# Patient Record
Sex: Female | Born: 1945
Health system: Southern US, Community
[De-identification: ages and names within clinical notes are randomized; demographics above are authoritative.]

## PROBLEM LIST (undated history)

## (undated) DIAGNOSIS — T63331A Toxic effect of venom of brown recluse spider, accidental (unintentional), initial encounter: Secondary | ICD-10-CM

## (undated) DIAGNOSIS — K5900 Constipation, unspecified: Secondary | ICD-10-CM

## (undated) DIAGNOSIS — A6920 Lyme disease, unspecified: Secondary | ICD-10-CM

## (undated) DIAGNOSIS — F410 Panic disorder [episodic paroxysmal anxiety] without agoraphobia: Secondary | ICD-10-CM

## (undated) DIAGNOSIS — F4001 Agoraphobia with panic disorder: Secondary | ICD-10-CM

## (undated) DIAGNOSIS — B029 Zoster without complications: Secondary | ICD-10-CM

## (undated) DIAGNOSIS — K219 Gastro-esophageal reflux disease without esophagitis: Secondary | ICD-10-CM

## (undated) DIAGNOSIS — K5792 Diverticulitis of intestine, part unspecified, without perforation or abscess without bleeding: Secondary | ICD-10-CM

## (undated) DIAGNOSIS — H9313 Tinnitus, bilateral: Secondary | ICD-10-CM

## (undated) DIAGNOSIS — G43909 Migraine, unspecified, not intractable, without status migrainosus: Secondary | ICD-10-CM

## (undated) DIAGNOSIS — E785 Hyperlipidemia, unspecified: Secondary | ICD-10-CM

## (undated) HISTORY — DX: Migraine, unspecified, not intractable, without status migrainosus: G43.909

## (undated) HISTORY — DX: Constipation, unspecified: K59.00

## (undated) HISTORY — DX: Lyme disease, unspecified: A69.20

## (undated) HISTORY — PX: DILATION AND CURETTAGE OF UTERUS: SHX78

## (undated) HISTORY — DX: Toxic effect of venom of brown recluse spider, accidental (unintentional), initial encounter: T63.331A

---
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
## 1980-11-02 DIAGNOSIS — F4001 Agoraphobia with panic disorder: Secondary | ICD-10-CM

## 1980-11-02 HISTORY — DX: Agoraphobia with panic disorder: F40.01

## 2003-02-13 ENCOUNTER — Ambulatory Visit (HOSPITAL_COMMUNITY): Admission: RE | Admit: 2003-02-13 | Discharge: 2003-02-13 | Payer: Self-pay | Admitting: Internal Medicine

## 2006-10-04 ENCOUNTER — Ambulatory Visit: Payer: Self-pay | Admitting: Internal Medicine

## 2006-10-29 ENCOUNTER — Ambulatory Visit: Payer: Self-pay | Admitting: Internal Medicine

## 2006-10-29 ENCOUNTER — Ambulatory Visit (HOSPITAL_COMMUNITY): Admission: RE | Admit: 2006-10-29 | Discharge: 2006-10-29 | Payer: Self-pay | Admitting: Internal Medicine

## 2006-10-29 ENCOUNTER — Encounter (INDEPENDENT_AMBULATORY_CARE_PROVIDER_SITE_OTHER): Payer: Self-pay | Admitting: *Deleted

## 2008-11-06 ENCOUNTER — Ambulatory Visit: Payer: Self-pay | Admitting: Internal Medicine

## 2008-11-09 ENCOUNTER — Ambulatory Visit (HOSPITAL_COMMUNITY): Admission: RE | Admit: 2008-11-09 | Discharge: 2008-11-09 | Payer: Self-pay | Admitting: Internal Medicine

## 2008-11-09 ENCOUNTER — Encounter: Payer: Self-pay | Admitting: Internal Medicine

## 2008-11-09 ENCOUNTER — Ambulatory Visit: Payer: Self-pay | Admitting: Internal Medicine

## 2008-12-11 ENCOUNTER — Telehealth (INDEPENDENT_AMBULATORY_CARE_PROVIDER_SITE_OTHER): Payer: Self-pay

## 2009-02-20 ENCOUNTER — Ambulatory Visit: Payer: Self-pay | Admitting: Internal Medicine

## 2009-02-20 DIAGNOSIS — F410 Panic disorder [episodic paroxysmal anxiety] without agoraphobia: Secondary | ICD-10-CM | POA: Insufficient documentation

## 2009-02-20 DIAGNOSIS — K5730 Diverticulosis of large intestine without perforation or abscess without bleeding: Secondary | ICD-10-CM | POA: Insufficient documentation

## 2009-02-20 DIAGNOSIS — Z8719 Personal history of other diseases of the digestive system: Secondary | ICD-10-CM

## 2009-02-20 DIAGNOSIS — H269 Unspecified cataract: Secondary | ICD-10-CM

## 2009-02-21 DIAGNOSIS — R1319 Other dysphagia: Secondary | ICD-10-CM | POA: Insufficient documentation

## 2009-02-21 DIAGNOSIS — D126 Benign neoplasm of colon, unspecified: Secondary | ICD-10-CM

## 2009-04-26 ENCOUNTER — Telehealth (INDEPENDENT_AMBULATORY_CARE_PROVIDER_SITE_OTHER): Payer: Self-pay

## 2009-06-07 DIAGNOSIS — R109 Unspecified abdominal pain: Secondary | ICD-10-CM

## 2009-06-07 DIAGNOSIS — R112 Nausea with vomiting, unspecified: Secondary | ICD-10-CM

## 2009-06-07 DIAGNOSIS — R1013 Epigastric pain: Secondary | ICD-10-CM

## 2009-06-07 DIAGNOSIS — R634 Abnormal weight loss: Secondary | ICD-10-CM

## 2009-09-04 ENCOUNTER — Ambulatory Visit: Payer: Self-pay | Admitting: Internal Medicine

## 2009-09-06 ENCOUNTER — Ambulatory Visit: Payer: Self-pay | Admitting: Internal Medicine

## 2009-09-06 DIAGNOSIS — R1013 Epigastric pain: Secondary | ICD-10-CM

## 2009-09-06 DIAGNOSIS — Z860100 Personal history of colon polyps, unspecified: Secondary | ICD-10-CM | POA: Insufficient documentation

## 2009-09-06 DIAGNOSIS — K589 Irritable bowel syndrome without diarrhea: Secondary | ICD-10-CM

## 2009-09-06 DIAGNOSIS — K3189 Other diseases of stomach and duodenum: Secondary | ICD-10-CM | POA: Insufficient documentation

## 2009-09-18 ENCOUNTER — Encounter: Payer: Self-pay | Admitting: Internal Medicine

## 2010-11-02 HISTORY — PX: COLONOSCOPY: SHX174

## 2010-12-18 ENCOUNTER — Emergency Department (HOSPITAL_COMMUNITY)
Admission: EM | Admit: 2010-12-18 | Discharge: 2010-12-19 | Disposition: A | Discharge status: Home / Self Care | Payer: BC Managed Care – PPO | Attending: Emergency Medicine | Admitting: Emergency Medicine

## 2010-12-18 DIAGNOSIS — R51 Headache: Secondary | ICD-10-CM | POA: Insufficient documentation

## 2010-12-18 DIAGNOSIS — K6289 Other specified diseases of anus and rectum: Secondary | ICD-10-CM | POA: Insufficient documentation

## 2010-12-18 DIAGNOSIS — K625 Hemorrhage of anus and rectum: Secondary | ICD-10-CM | POA: Insufficient documentation

## 2010-12-18 DIAGNOSIS — K648 Other hemorrhoids: Secondary | ICD-10-CM | POA: Insufficient documentation

## 2010-12-18 DIAGNOSIS — K644 Residual hemorrhoidal skin tags: Secondary | ICD-10-CM | POA: Insufficient documentation

## 2010-12-18 DIAGNOSIS — K5900 Constipation, unspecified: Secondary | ICD-10-CM | POA: Insufficient documentation

## 2011-01-02 ENCOUNTER — Encounter: Payer: Self-pay | Admitting: Internal Medicine

## 2011-01-05 ENCOUNTER — Ambulatory Visit (INDEPENDENT_AMBULATORY_CARE_PROVIDER_SITE_OTHER): Payer: BC Managed Care – PPO | Admitting: Internal Medicine

## 2011-01-05 ENCOUNTER — Encounter: Payer: Self-pay | Admitting: Internal Medicine

## 2011-01-13 NOTE — Letter (Signed)
Summary: CT/CT ABD-PELV W/CM  CT/CT ABD-PELV W/CM   Imported By: Rexene Alberts 01/05/2011 16:06:26  _____________________________________________________________________  External Attachment:    Type:   Image     Comment:   External Document

## 2011-01-13 NOTE — Letter (Signed)
Summary: [facility]CS ORDER  TCS ORDER   Imported By: Ave Filter 01/05/2011 16:28:54  _____________________________________________________________________  External Attachment:    Type:   Image     Comment:   External Document

## 2011-01-19 ENCOUNTER — Ambulatory Visit (HOSPITAL_COMMUNITY)
Admission: RE | Admit: 2011-01-19 | Discharge: 2011-01-19 | Disposition: A | Discharge status: Home / Self Care | Payer: BC Managed Care – PPO | Source: Ambulatory Visit | Attending: Internal Medicine | Admitting: Internal Medicine

## 2011-01-19 ENCOUNTER — Encounter: Payer: BC Managed Care – PPO | Admitting: Internal Medicine

## 2011-01-19 ENCOUNTER — Other Ambulatory Visit: Payer: Self-pay | Admitting: Internal Medicine

## 2011-01-19 DIAGNOSIS — D126 Benign neoplasm of colon, unspecified: Secondary | ICD-10-CM

## 2011-01-19 DIAGNOSIS — K648 Other hemorrhoids: Secondary | ICD-10-CM

## 2011-01-19 DIAGNOSIS — K921 Melena: Secondary | ICD-10-CM

## 2011-01-19 DIAGNOSIS — K5730 Diverticulosis of large intestine without perforation or abscess without bleeding: Secondary | ICD-10-CM

## 2011-01-19 DIAGNOSIS — Z860100 Personal history of colon polyps, unspecified: Secondary | ICD-10-CM | POA: Insufficient documentation

## 2011-01-19 DIAGNOSIS — K644 Residual hemorrhoidal skin tags: Secondary | ICD-10-CM | POA: Insufficient documentation

## 2011-01-19 DIAGNOSIS — Z800 Family history of malignant neoplasm of digestive organs: Secondary | ICD-10-CM | POA: Insufficient documentation

## 2011-01-21 ENCOUNTER — Encounter: Payer: Self-pay | Admitting: Internal Medicine

## 2011-01-27 NOTE — Op Note (Signed)
  NAMEJILLAYNE, WITTE              ACCOUNT NO.:  [id number]  MEDICAL RECORD NO.:  [id number]           PATIENT TYPE:  O  LOCATION:  DAYP                          FACILITY:  [facility]  PHYSICIAN:  R. Roetta Sessions, M.D. DATE OF BIRTH:  [birth year]  DATE OF PROCEDURE:  01/19/2011 DATE OF DISCHARGE:                              OPERATIVE REPORT   Ileocolonoscopy with snare polypectomy.  INDICATIONS FOR PROCEDURE:  A 65 year old lady with recent bout of diverticulitis, intermittent hematochezia in the setting of constipation, and positive family history of colon cancer.  She has a personal history of polyps and a family history of polyps in her son. Colonoscopy is now being done.  Risks, benefits, limitations, alternatives, imponderables have been discussed, questions answered. Please see the documentation medical record.  PROCEDURE NOTE:  O2 saturation, blood pressure, pulse, and respirations were monitored throughout the entire procedure.  CONSCIOUS SEDATION:  Versed 9 mg IV, Demerol 150 mg IV in divided doses.  INSTRUMENT:  Pentax video chip system.  FINDINGS:  Digital rectal exam revealed a couple external hemorrhoidal tags only.  Endoscopic Findings:  The prep was adequate.  Colon: Colonic mucosa was surveyed from the rectosigmoid junction through the left transverse right colon to the appendiceal orifice, ileocecal valve/cecum.  These structures were well seen and photographed for the record.  Terminal ileum was intubated 5 cm.  From this level, scope was slowly and cautiously withdrawn.  All previously mentioned mucosal surfaces were again seen.  The patient had numerous left-sided diverticula and had an 8-mm sessile polyp in junction of the descending and sigmoid.  This polyp was hot snare removed.  The remainder of colonic os and terminal mucosa appeared normal.  Scope was pulled down the rectum.  A thorough examination of the rectal mucosa including retroflexed view of the  anal verge, on fos view of the anal canal demonstrated no other abnormalities.  The patient tolerated the procedure well.  Cecal withdrawal time 13 minutes.  IMPRESSION: 1. External hemorrhoids, otherwise normal rectum. 2. Left-sided diverticula status post snare polypectomy left colon     polyp.  Remainder of the colonic mucosa and terminal ileal mucosa     appeared normal.  RECOMMENDATIONS: 1. Polyp and diverticulosis literature provided to Ms. Cortner. 2. Daily fiber supplement, Benefiber 1 tablespoon. 3. Align probiotic 1 capsule daily. 4. Anusol-HC suppositories one per rectum at bedtime x10 days. 5. Followup on path. 6. Further recommendations to follow.     Jonathon Bellows, M.D.     RMR/MEDQ  D:  01/19/2011  T:  01/19/2011  Job:  [id number]  Electronically Signed by Lorrin Goodell M.D. on 01/27/2011 11:23:08 AM

## 2011-01-29 NOTE — Assessment & Plan Note (Signed)
Summary: stomach ache/PT WENT TO [facility] AN HAD CT AND LABS ON 01/01/11/LAW   Vital Signs:  Patient profile:   65 year old female Height:      66 inches Weight:      174 pounds BMI:     28.19 Temp:     98.3 degrees F oral Pulse rate:   72 / minute BP sitting:   146 / 82  (right arm)  Vitals Entered By: Carolan Clines LPN (January 04, 1609 3:10 PM)  History of Present Illness: Repeat blood pressure after the encounter 130/72 right arm    Pleasant 65 year old lady with  recent abdominal pain very much similar to prior bout of diverticulitis. She was given a 10 day course of Augmentin by her PCP with resolution of her symptoms a short time later. She saw me in the hallway at the hospital last week and conveyed her symptoms to me. She underwent a CT of the abdomen and pelvis in [place] last week which revealed extensive diverticulosis but no evidence of diverticulitis or other pathology.  She has been constipated recently. She has had some hematochezia from time to time. She has a history of colonic adenomas and is due for a surveillance colonoscopy this year. In fact, she was at the hospital last week with her son who had some polyps removed by Dr. Karilyn Cota.  She also notes a bulge coming from her vagina when she strains. When she was seen in the ED previously, she was told she may have a rectocele. She is to see Dr. Mora Appl in July of this year.  Patient denies any upper GI tract symptoms such as reflux, odynaphagia, dysphagia early satiety nausea or  vomiting. She denies melena. She denies any recent weight loss.   Current Problems (verified): 1)  Colonic Polyps, Hx of  (ICD-V12.72) 2)  Ibs  (ICD-564.1) 3)  Dyspepsia  (ICD-536.8) 4)  Abdominal Pain  (ICD-789.00) 5)  Weight Loss  (ICD-783.21) 6)  Nausea and Vomiting  (ICD-787.01) 7)  Epigastric Pain  (ICD-789.06) 8)  Dysphagia  (ICD-787.29) 9)  Colonic Polyps  (ICD-211.3) 10)  Cataracts  (ICD-366.9) 11)  Helicobacter Pylori Gastritis, Hx of   (ICD-V12.79) 12)  Diverticulosis of Colon  (ICD-562.10) 13)  Panic Disorder  (ICD-300.01)  Allergies: 1)  ! Flagyl 2)  ! Codeine 3)  ! Cipro 4)  ! * Antihistamines  Past History:  Past Medical History: Last updated: 09/04/2009 hx of brown recluse spider bite hx of lyme disease  Past Surgical History: Last updated: 06/07/2009 tonsillectomy appendectomy cholecystectomy  Family History: Last updated: 02-26-09 Father: deceased 61-melanoma Mother: deceased 86-dementia, sepsis Siblings: 1 brother, 2 sisters  Social History: Last updated: 26-Feb-2009 Marital Status: widow Children: 5 boys Occupation:none   Physical Exam  General:  pleasant alert lady resting comfortably Eyes:  no scleral icterus. Conjunctiva are pink Lungs:  clear to auscultation Heart:  regular rate and rhythm without murmur gallop or rub Abdomen:  nondistended positive bowel sounds.Soft nontender without appreciable mass or organomegaly Rectal:  deferred until time of colonoscopy   Impression & Recommendations: Impression: Pleasant 65 year old lady with recent abdominal pain similar to the symptoms she had with a bout of diverticulitis some 2 years ago. Now better with a course of Augmentin. CT on March 1 is reassuring. She has had some hematochezia with straining.  The buldge she notes in her vagina could well be a rectocele. Not mentioned above, she was taking a fiber supplement faithfully, but stopped  his practice  sometime ago because she was feeling so well. She has a personal history of colonic adenomas and family including a first-degree relative with colonic polyps.  Recommendations: Proceed with surveillance/diagnostic colonoscopy in the near future. Risks, benefits, limitations, alternatives and imponderables have been reviewed. Her questions have been answered. She is agreeable. Will make further recommendations once  colonoscopy has been accomplished

## 2011-02-03 ENCOUNTER — Telehealth: Payer: Self-pay

## 2011-02-03 NOTE — Telephone Encounter (Signed)
Pt called requesting results from TCS/path. Please advise

## 2011-02-03 NOTE — Telephone Encounter (Signed)
Please let pt know polyp was not pre-cancerous or worrisome. RMR will review & make further recommendations when he returns. Thanks

## 2011-02-04 NOTE — Telephone Encounter (Signed)
Pt aware, stated she has had some nausea every morning but feeling better by late afternoon. Offered to ask NP or PA and pt stated she wanted to wait until RMR got back and ask him. Advised pt to try clear liquids in the mornings and if she got worse to call me back.

## 2011-02-08 ENCOUNTER — Encounter: Payer: Self-pay | Admitting: Internal Medicine

## 2011-02-08 NOTE — Telephone Encounter (Signed)
See 02/08/11 letter

## 2011-02-23 ENCOUNTER — Encounter: Payer: Self-pay | Admitting: *Deleted

## 2011-02-23 ENCOUNTER — Encounter: Payer: Self-pay | Admitting: Internal Medicine

## 2011-03-17 NOTE — H&P (Signed)
[name], [name]              ACCOUNT NO.:  [id number]   MEDICAL RECORD NO.:  [id number]          PATIENT TYPE:  AMB   LOCATION:  DAY                           FACILITY:  [facility]   PHYSICIAN:  R. Roetta Sessions, M.D. DATE OF BIRTH:  10-30-1946   DATE OF ADMISSION:  DATE OF DISCHARGE:  LH                              HISTORY & PHYSICAL   REFERRING PHYSICIAN:  Doreen Beam, MD, [facility].   REASON FOR CONSULTATION:  Upper abdominal pain and nausea.   HISTORY OF PRESENT ILLNESS:  [name]. Alexa Wilson is a very pleasant 65-  year-old Caucasian female sent over at the courtesy of Dr. Doreen Beam to  previous to further evaluate a 4-year history of intermittent early  morning nausea.  She states over the past 2 years her symptoms have  worsened.  She started having early morning nausea and queasiness after  the death of her husband some 4 years ago and she notes that over the  past 2 years, particularly over the past 1 year when she started keeping  her grandchildren for her daughter, had ago back to work but the nausea  has picked up significantly.  Almost every morning, she has queasiness  for a couple of hours, it may start at 5 or 6 o'clock in the morning.  She has only vomited a couple of times in the past several years.  She  also has hunger pangs intermittently with early morning episodes of  nausea.  She has no trouble whatsoever eating a full meal.  Denies early  satiety and gastroesophageal reflux disease symptoms including  heartburn/pyrosis.  No odynophagia.  No dysphagia.  Has not had any  melena or rectal bleeding.  Denies constipation, diarrhea.  Has one  bowel movement daily.  She has, along with hunger pangs and nausea,  bleeding epigastric pain from time to time.  She denies any nonsteroidal  use.  There is no prior history of peptic ulcer disease.  She is on acid  suppression therapy.  She does have a significant history for panic  attacks and is therefore  on alprazolam.  Currently, her gallbladder was  removed in 1967.  She tells me she had a CT scan over at [facility] sometime last year, did not show anything other than  diverticulosis.  Moreover, she had labs done through Dr. Sherril Croon' office  back in the fall which demonstrated normal LFTs.  She was not anemic.  Factor H and H was slightly elevated at 15.1 and 45.5 at that time.  TSH  was normal at 262.64.  A 25-hydroxy vitamin D level was somewhat low at  21.5.  She does take vitamin D calcium supplementation.  I have seen  this nice lady previously for colonic polyps.  The last colonoscopy was  in December 2007 which she was found to have multiple colonic adenoma.  She is due for surveillance colonoscopy the end of this year (December  2010).   PAST MEDICAL HISTORY:  Recurrent panic attacks.   PAST SURGERIES:  Multiple colonoscopies, history of colonic polyps,  tonsillectomy, appendectomy, cholecystectomy.  CURRENT MEDICATIONS:  1. Alprazolam 1 mg t.i.d.  2. Multivitamin.  3. Vitamin D daily.   ALLERGIES:  CODEINE, CIPRO, ANTIHISTAMINES.   FAMILY HISTORY:  Mother died at age 63 with a complication related to  sepsis.  Father died with melanoma.  No history of GI or liver illness.   SOCIAL HISTORY:  The patient is widowed.  She is retired from [employer].  No tobacco.  No alcohol.  No illicit drugs.   REVIEW OF SYSTEMS:  No recent chest pain, dyspnea on exertion.  She is  down 13 pounds from what she weighed here in 2007.  No night sweats.   PHYSICAL EXAMINATION:  GENERAL:  Pleasant 65 year old lady resting  comfortably.  VITAL SIGNS:  Weight 173, height 5 feet 6 inches, temperature 97.9, BP  140/78, pulse 84.  SKIN:  Warm and dry.  There is no jaundice or cutaneous stigmata of  chronic liver disease.  HEENT:  No scleral icterus.  Conjunctivae pink.  CHEST:  Lungs are clear to auscultation.  CARDIAC:  Regular rate and rhythm without murmur, gallop, or rub.   ABDOMEN:  Nondistended.  Positive bowel sounds, soft, entirely nontender  without appreciable mass or organomegaly.  EXTREMITIES:  No edema.   IMPRESSION:  [name] is a pleasant 65 year old lady with  chronic symptoms of early morning nausea, some symptoms of vague  epigastric pain associated with nausea.  She rarely ever vomits.  She  also has early-morning hunger pangs.  Her symptoms have been chronic and  longstanding.  She cites temporally worsening of symptoms with looking  after her grandchildren over the past 1 year.  I certainly do not detect  any alarm features.  She has had an insidious mild degree of weight loss  over the past 3 years.  She reportedly had essentially negative CT scan  recently.  Her labs are reassuring and are as noted above.   I told Alexa Wilson we ought to go ahead in directly visualize her upper  GI tract via an EGD in the near future.  Risks, benefits, alternatives,  limitations have been reviewed, questions answered.  She is agreeable.  We will plan to set this up at [facility] at her convenience.  As a  separate issue, she has a personal history of colonic adenomas and would  be due for surveillance colonoscopy in December 2010.  I see no reason  to change that schedule currently.  Further recommendations to follow.   I would like to thank Dr. Doreen Beam for allowing me to see this nice  lady once again.      Jonathon Bellows, M.D.  Electronically Signed     RMR/MEDQ  D:  11/06/2008  T:  11/07/2008  Job:  [id number]

## 2011-03-17 NOTE — Op Note (Signed)
Alexa Wilson, Alexa Wilson              ACCOUNT NO.:  [id number]   MEDICAL RECORD NO.:  [id number]          PATIENT TYPE:  AMB   LOCATION:  DAY                           FACILITY:  [facility]   PHYSICIAN:  R. Roetta Sessions, M.D. DATE OF BIRTH:  09/10/1946   DATE OF PROCEDURE:  11/09/2008  DATE OF DISCHARGE:                               OPERATIVE REPORT   INDICATIONS FOR PROCEDURE:  A 65 year old lady with a vague epigastric  pain, nausea, and rare vomiting with some early morning hunger pangs.  __________ weight loss over the past 1 year.  She had reportedly a  negative CT of the abdomen done at [facility] recently.  EGD is  now being done.  Risks, benefits, alternatives and limitations have been  reviewed.  Please see the documentation in the medical record.   PROCEDURE NOTE:  O2 saturation, blood pressure, pulse, respirations were  monitored throughout the entire procedure.   CONSCIOUS SEDATION:  Versed 5 mg IV, Demerol 125 mg IV in divided doses.  Cetacaine spray for topical pharyngeal anesthesia.   INSTRUMENT:  Pentax video chip system.   FINDINGS:  Examination of the tubular esophagus revealed no mucosal  abnormalities.  EG junction easily traversed.  Stomach:  Gastric cavity  was emptied and insufflated well with air.  Thorough examination of the  gastric mucosa including retroflexed view of the proximal stomach  esophagogastric junction demonstrated a hiatal hernia and diffuse  mottling  and fine submucosal petechiae of the epigastric mucosa.  There  was no infiltrating process or ulcer.  Pylorus was patent and easily  traversed.  Examination of the bulb and second portion revealed no  abnormalities.  Therapeutic/diagnostic maneuvers performed.  Antral and  body biopsies were taken to further evaluate the abnormal findings today  and to rule out H. pylori.  The patient tolerated the procedure well and  was reacted in endoscopy.   IMPRESSION:  Normal esophagus, hiatal hernia,  diffuse subtle mottling  and submucosal petechiae of the epigastric mucosa.  Status post biopsy.  Patent pylorus, normal D1 and D2.   RECOMMENDATIONS:  1. Follow up on path.  2. Further recommendations to follow.      Jonathon Bellows, M.D.  Electronically Signed     RMR/MEDQ  D:  11/09/2008  T:  11/09/2008  Job:  [id number]   cc:   Doreen Beam, MD  Fax: [phone]

## 2011-03-20 NOTE — Op Note (Signed)
NAME:  Alexa Wilson, Alexa Wilson                        ACCOUNT NO.:  [id number]   MEDICAL RECORD NO.:  [id number]                   PATIENT TYPE:  AMB   LOCATION:  DAY                                  FACILITY:  [facility]   PHYSICIAN:  R. Roetta Sessions, M.D.              DATE OF BIRTH:  1946/09/22   DATE OF PROCEDURE:  02/13/2003  DATE OF DISCHARGE:                                 OPERATIVE REPORT   PROCEDURE:  Surveillance colonoscopy.   INDICATIONS FOR PROCEDURE:  The patient is a 65 year old lady with a history  of colonic polyps on prior colonoscopy at [facility].  She has done  well, except for intermittent postprandial abdominal cramps and loose  stools.  She has a history of diverticulosis.  Colonoscopy is now being done  to further evaluate her symptoms and in part for surveillance purposes.  This approach has been discussed with the patient at the bedside.  The  potential risks, benefits, and alternatives have been reviewed and questions  answered.  She is agreeable.  Please see my documented H&P on the chart for  more information.   PROCEDURE:  O2 saturation, blood pressure, pulses, and respirations were  monitored throughout the entire procedure.  Conscious sedation was with  Versed 5 mg IV, Demerol 125 mg IV in divided doses.  The instrument used was  the Olympus video chip adult colonoscope.   FINDINGS:  Digital rectal examination revealed no abnormalities.   ENDOSCOPIC FINDINGS:  The prep was good.   Rectum:  Examination of the rectal mucosa including retroflex view of the  anal verge revealed no abnormalities.   Colon:  The colonic mucosa was surveyed from the rectosigmoid junction  through the left, transverse, right colon to the area of the appendiceal  orifice, ileocecal valve, and cecum.  These structures were well-seen and  photographed for the record.  The patient was noted to have left-sided  diverticula.  The remainder of the colonic mucosa appeared normal.   The  terminal ileum was intubated 10 cm.  This segment of GI tract also appeared  normal.  From this level, the scope was slowly withdrawn.  All previously  mentioned mucosal surfaces were again seen.  No other abnormalities were  observed.  The patient tolerated the procedure well and was reactive in  endoscopy.   IMPRESSION:  1. Normal rectum.  Left-sided diverticula.  The remainder of the colonic     mucosa appeared normal.  Normal terminal ileum.  Not mentioned above,     random biopsies of the left colon were taken previously because of her     history of diarrhea.  There was no evidence of microscopic colitis.  2. I suspect that the patient has an element of irritable bowel syndrome.    RECOMMENDATIONS:  1. Diverticulosis literature provided to the patient.  2. NuLev tablets one orally on the tongue before meals and at bedtime prior  abdominal cramps and diarrhea.  3. Repeat colonoscopy in five years.                                               Jonathon Bellows, M.D.    RMR/MEDQ  D:  02/13/2003  T:  02/13/2003  Job:  [id number]   cc:   Wende Crease, M.D.  [facility]  [place], [place]

## 2011-03-20 NOTE — Op Note (Signed)
[name], Alexa Wilson              ACCOUNT NO.:  [id number]   MEDICAL RECORD NO.:  [id number]          PATIENT TYPE:  AMB   LOCATION:  DAY                           FACILITY:  [facility]   PHYSICIAN:  R. Roetta Sessions, M.D. DATE OF BIRTH:  1945-12-19   DATE OF PROCEDURE:  10/29/2006  DATE OF DISCHARGE:                               OPERATIVE REPORT   INDICATIONS FOR PROCEDURE:  65 year old lady with a history colonic  adenomas. Has recent hematochezia in the setting of abdominal cramps  diarrhea intermittently.  We started her on some NuLev and Citrucel.  This been associated with resolution of all the above-mentioned  symptoms.  Colonoscopy is now being done.  This approach has been  discussed with the patient at length.  Potential risks, benefits, and  alternatives have been reviewed.   PROCEDURE NOTE:  O2 saturation, blood pressure, pulse, and respirations  were monitored throughout the entire procedure.  Conscious sedation with Versed 5 mg IV, Demerol 100 mg IV in divided  doses.   INSTRUMENT:  Olympus video chip system.   FINDINGS:  Digital rectal exam revealed a couple external hemorrhoidal  tags. Otherwise unremarkable endoscopic findings.  The prep was good.  Colon:  Colonic mucosa was surveyed from the rectosigmoid junction  through the left, transverse, right colon, appendiceal orifice,  ileocecal valve, and cecum.  These structures were well seen and  photographed for the record.  From this level, the scope was slowly  withdrawn.  All previously mentioned  mucosal surfaces were again seen.  The patient a diminutive polyp at the base of the cecum which was cold  biopsied/removed.  There was a second diminutive polyp in the splenic  flexure.  The patient had left-sided diverticula.  The remainder of the  colonic  mucosa appeared normal.  The scope was pulled down into the  rectum, where thorough examination of the rectal mucosa, including a  retroflexed view of anal verge, was  undertaken.  The rectal mucosa  appeared normal.  The patient tolerated the procedure well and was  reactivated.   ENDOSCOPIC IMPRESSION:  1. A couple of external hemorrhoidal tags. Otherwise, normal rectum.  2. Left-sided diverticula.  3. Diminutive polyps at the splenic flexure and cecum, removed as      described above.  4. The remainder of the colonic mucosa appeared normal.   RECOMMENDATIONS:  1. Continue Citrucel daily, with adequate fluids.  NuLev on a p.r.n.      basis for irritable bowel syndrome. Follow up on pathology.  2. Further recommendations to follow.      Jonathon Bellows, M.D.  Electronically Signed     RMR/MEDQ  D:  10/29/2006  T:  10/29/2006  Job:  [id number]   cc:   Doreen Beam  Fax: [phone]

## 2011-03-20 NOTE — H&P (Signed)
[name], Alexa Wilson              ACCOUNT NO.:  [id number]   MEDICAL RECORD NO.:  [id number]          PATIENT TYPE:  AMB   LOCATION:  DAY                            FACILITY:   PHYSICIAN:  R. Roetta Sessions, M.D. DATE OF BIRTH:  10/24/1946   DATE OF ADMISSION:  DATE OF DISCHARGE:  LH                              HISTORY & PHYSICAL   CHIEF COMPLAINT:  Intermittent postprandial abdominal cramps, diarrhea,  paper hematochezia.   [name] is a pleasant, 65 year old lady with a history of  colonic adenomatous polyps removed from her colon; the last colonoscopy  February 13, 2003, revealing left-sided diverticulosis.  Biopsies of  colonic mucosa were negative for colitis.  Prior to that, she did have a  history of colon polyps.  She was due to have a repeat colonoscopy  January 2009.  Because she has noticed some blood when wiping, she has  come for further evaluation.  Again, she has a long-standing history of  irritable bowel syndrome manifested by intermittent postprandial  abdominal cramps with alternating constipation and diarrhea.  She has  not been taking NuLev on a regular basis but when she has taken it  previously, this helped tremendously.   She is followed by Dr. [facility].   PAST MEDICAL HISTORY:  Significant for:  1. Diverticulosis.  2. Colon polyps.  3. Panic disorder.  4. History of IBS.   PAST SURGERIES:  1. Tonsillectomy.  2. Appendectomy.  3. Cholecystectomy.   CURRENT MEDICATIONS:  1. Xanax 1 mg 2-3 times daily.  2. Citrucel p.r.n.  3. Tylenol p.r.n.   ALLERGIES:  1. CIPRO.  2. CODEINE.   FAMILY HISTORY:  Mother died with heart related problems, age 88.  Father died with melanoma age 29.  No history of chronic GI or liver  illness.   SOCIAL HISTORY:  The patient is widowed.  She has 5 sons.  She does not  work.  She does not use tobacco products or alcohol.   REVIEW OF SYSTEMS:  No chest pain.  No dyspnea  on exertion.  No change  in weight.  No odynophagia, dysphagia, early satiety, reflux symptoms,  nausea, or vomiting.  She has a gynecologic evaluation earlier this year  by Dr. Marry Guan over in [place] and everything reportedly checked out  fine.   PHYSICAL EXAMINATION:  A pleasant, somewhat anxious, 65 year old lady  resting comfortably.  Weight 186. Height 5 feet 6 inches. Temp 98.  BP 138/86.  Pulse 80.  SKIN:  Warm and dry.  There is no jaundice.  No __________ stigmata for  chronic liver disease.  HEENT:  No scleral icterus.  JVD is not prominent.  CHEST:  Lungs are clear to auscultation.  CARDIAC:  Regular rate and rhythm without murmur, gallop, or rub.  BREASTS:  Deferred.  ABDOMEN:  Flat.  Positive bowel sounds, soft, nontender without  appreciable mass or organomegaly.  EXTREMITIES:  No edema.  RECTAL:  Deferred __________ colonoscopy.   IMPRESSION:  [name] is a pleasant, 65 year old lady with  irritable bowel syndrome, has had some  paper hematochezia recently.  She  has a history of colonic adenomas.  __________ she had a colonoscopy  back in 2004 without significant findings.  Because she has symptoms of  hematochezia, I agree with Ms. Fabela she needs further evaluation.  I  have offered her to go ahead and move her colonoscopy up.  Potential  risks, benefits, and alternatives of this procedure have been reviewed  and we will plan for colonoscopy in the very near future.  I have  encouraged her to use NuLev one on the tongue a.c. p.r.n. any abdominal  cramps, diarrhea, and have recommended that she get back on her Citrucel  and take it on a regular daily basis.  Further recommendations to  follow.      Jonathon Bellows, M.D.  Electronically Signed     RMR/MEDQ  D:  10/04/2006  T:  10/05/2006  Job:  [id number]   cc:   Doreen Beam  Fax: [phone]

## 2011-09-16 ENCOUNTER — Telehealth: Payer: Self-pay | Admitting: Internal Medicine

## 2011-09-16 NOTE — Telephone Encounter (Signed)
No, RMR sent her a letter and  said she didn't need another one for 5 years.

## 2011-09-16 NOTE — Telephone Encounter (Signed)
I'm working on the Dec 2012 recall list and pt had colonoscopy done in March 2012 (5 years). Does she still need one for December?

## 2012-07-14 ENCOUNTER — Emergency Department (HOSPITAL_COMMUNITY): Payer: Medicare Other

## 2012-07-14 ENCOUNTER — Inpatient Hospital Stay (HOSPITAL_COMMUNITY)
Admission: EM | Admit: 2012-07-14 | Discharge: 2012-07-17 | DRG: 392 | Disposition: A | Discharge status: Home / Self Care | Payer: Medicare Other | Attending: Internal Medicine | Admitting: Internal Medicine

## 2012-07-14 ENCOUNTER — Encounter (HOSPITAL_COMMUNITY): Payer: Self-pay | Admitting: Emergency Medicine

## 2012-07-14 DIAGNOSIS — D126 Benign neoplasm of colon, unspecified: Secondary | ICD-10-CM

## 2012-07-14 DIAGNOSIS — K5732 Diverticulitis of large intestine without perforation or abscess without bleeding: Principal | ICD-10-CM | POA: Diagnosis present

## 2012-07-14 DIAGNOSIS — Z23 Encounter for immunization: Secondary | ICD-10-CM

## 2012-07-14 DIAGNOSIS — N390 Urinary tract infection, site not specified: Secondary | ICD-10-CM

## 2012-07-14 DIAGNOSIS — K5730 Diverticulosis of large intestine without perforation or abscess without bleeding: Secondary | ICD-10-CM | POA: Diagnosis present

## 2012-07-14 DIAGNOSIS — Z79899 Other long term (current) drug therapy: Secondary | ICD-10-CM

## 2012-07-14 DIAGNOSIS — K5792 Diverticulitis of intestine, part unspecified, without perforation or abscess without bleeding: Secondary | ICD-10-CM

## 2012-07-14 DIAGNOSIS — R109 Unspecified abdominal pain: Secondary | ICD-10-CM | POA: Diagnosis present

## 2012-07-14 DIAGNOSIS — F410 Panic disorder [episodic paroxysmal anxiety] without agoraphobia: Secondary | ICD-10-CM | POA: Diagnosis present

## 2012-07-14 DIAGNOSIS — K589 Irritable bowel syndrome without diarrhea: Secondary | ICD-10-CM | POA: Diagnosis present

## 2012-07-14 DIAGNOSIS — R112 Nausea with vomiting, unspecified: Secondary | ICD-10-CM | POA: Diagnosis present

## 2012-07-14 HISTORY — DX: Panic disorder (episodic paroxysmal anxiety): F41.0

## 2012-07-14 HISTORY — DX: Diverticulitis of intestine, part unspecified, without perforation or abscess without bleeding: K57.92

## 2012-07-14 HISTORY — DX: Zoster without complications: B02.9

## 2012-07-14 LAB — URINALYSIS, ROUTINE W REFLEX MICROSCOPIC
Glucose, UA: NEGATIVE mg/dL
Hgb urine dipstick: NEGATIVE
Ketones, ur: NEGATIVE mg/dL
Nitrite: NEGATIVE
Protein, ur: NEGATIVE mg/dL
Specific Gravity, Urine: 1.01 (ref 1.005–1.030)
Urobilinogen, UA: 0.2 mg/dL (ref 0.0–1.0)

## 2012-07-14 LAB — COMPREHENSIVE METABOLIC PANEL
ALT: 19 U/L (ref 0–35)
AST: 35 U/L (ref 0–37)
Alkaline Phosphatase: 120 U/L — ABNORMAL HIGH (ref 39–117)
CO2: 27 mEq/L (ref 19–32)
Calcium: 10.3 mg/dL (ref 8.4–10.5)
Chloride: 101 mEq/L (ref 96–112)
GFR calc non Af Amer: 68 mL/min — ABNORMAL LOW (ref >90)
Potassium: 3.9 mEq/L (ref 3.5–5.1)
Sodium: 140 mEq/L (ref 135–145)
Total Bilirubin: 0.6 mg/dL (ref 0.3–1.2)

## 2012-07-14 LAB — CBC
Platelets: 351 10*3/uL (ref 150–400)
RBC: 4.19 MIL/uL (ref 3.87–5.11)
WBC: 12.8 10*3/uL — ABNORMAL HIGH (ref 4.0–10.5)

## 2012-07-14 LAB — URINE MICROSCOPIC-ADD ON

## 2012-07-14 MED ORDER — DEXTROSE 5 % IV SOLN
1.0000 g | Freq: Once | INTRAVENOUS | Status: AC
  Administered 2012-07-14: 1 g via INTRAVENOUS
  Filled 2012-07-14: qty 10

## 2012-07-14 MED ORDER — DOCUSATE SODIUM 100 MG PO CAPS
100.0000 mg | ORAL_CAPSULE | Freq: Two times a day (BID) | ORAL | Status: DC
  Administered 2012-07-14 – 2012-07-15 (×4): 100 mg via ORAL
  Filled 2012-07-14 (×8): qty 1

## 2012-07-14 MED ORDER — ONDANSETRON HCL 4 MG/2ML IJ SOLN
4.0000 mg | Freq: Once | INTRAMUSCULAR | Status: AC
  Administered 2012-07-14: 4 mg via INTRAVENOUS
  Filled 2012-07-14: qty 2

## 2012-07-14 MED ORDER — MORPHINE SULFATE 2 MG/ML IJ SOLN: 1.0000 mg | INTRAMUSCULAR | Status: DC | PRN

## 2012-07-14 MED ORDER — IOHEXOL 300 MG/ML  SOLN: 40.0000 mL | Freq: Once | INTRAMUSCULAR | Status: DC | PRN

## 2012-07-14 MED ORDER — DEXTROSE 5 % IV SOLN
1.0000 g | INTRAVENOUS | Status: DC
  Administered 2012-07-15: 1 g via INTRAVENOUS
  Filled 2012-07-14: qty 10

## 2012-07-14 MED ORDER — ONDANSETRON HCL 4 MG/2ML IJ SOLN
4.0000 mg | Freq: Four times a day (QID) | INTRAMUSCULAR | Status: DC | PRN
  Administered 2012-07-17: 4 mg via INTRAVENOUS
  Filled 2012-07-14: qty 2

## 2012-07-14 MED ORDER — SODIUM CHLORIDE 0.9 % IV SOLN
INTRAVENOUS | Status: DC
  Administered 2012-07-14: 23:00:00 via INTRAVENOUS
  Administered 2012-07-14 (×2): 150 mL/h via INTRAVENOUS
  Administered 2012-07-15 – 2012-07-16 (×2): via INTRAVENOUS

## 2012-07-14 MED ORDER — INFLUENZA VIRUS VACC SPLIT PF IM SUSP
0.5000 mL | INTRAMUSCULAR | Status: AC
  Administered 2012-07-15: 0.5 mL via INTRAMUSCULAR
  Filled 2012-07-14: qty 0.5

## 2012-07-14 MED ORDER — SENNA 8.6 MG PO TABS
1.0000 | ORAL_TABLET | Freq: Two times a day (BID) | ORAL | Status: DC
  Administered 2012-07-14 – 2012-07-15 (×4): 8.6 mg via ORAL
  Filled 2012-07-14 (×8): qty 1

## 2012-07-14 MED ORDER — METRONIDAZOLE IN NACL 5-0.79 MG/ML-% IV SOLN
500.0000 mg | Freq: Three times a day (TID) | INTRAVENOUS | Status: DC
  Administered 2012-07-14 – 2012-07-15 (×3): 500 mg via INTRAVENOUS
  Filled 2012-07-14 (×5): qty 100

## 2012-07-14 MED ORDER — ENOXAPARIN SODIUM 40 MG/0.4ML ~~LOC~~ SOLN
40.0000 mg | SUBCUTANEOUS | Status: DC
  Administered 2012-07-14 – 2012-07-17 (×4): 40 mg via SUBCUTANEOUS
  Filled 2012-07-14 (×4): qty 0.4

## 2012-07-14 MED ORDER — IOHEXOL 300 MG/ML  SOLN
20.0000 mL | INTRAMUSCULAR | Status: DC
Start: 2012-07-14 — End: 2012-07-14
  Administered 2012-07-14: 20 mL via ORAL

## 2012-07-14 MED ORDER — HYDROMORPHONE HCL PF 1 MG/ML IJ SOLN
0.5000 mg | Freq: Once | INTRAMUSCULAR | Status: AC
  Administered 2012-07-14: 0.5 mg via INTRAVENOUS
  Filled 2012-07-14: qty 1

## 2012-07-14 MED ORDER — ACETAMINOPHEN 500 MG PO TABS
1000.0000 mg | ORAL_TABLET | Freq: Four times a day (QID) | ORAL | Status: DC | PRN
  Administered 2012-07-14: 1000 mg via ORAL
  Filled 2012-07-14 (×2): qty 2

## 2012-07-14 MED ORDER — IOHEXOL 300 MG/ML  SOLN
100.0000 mL | Freq: Once | INTRAMUSCULAR | Status: AC | PRN
  Administered 2012-07-14: 100 mL via INTRAVENOUS

## 2012-07-14 MED ORDER — IBUPROFEN 400 MG PO TABS
400.0000 mg | ORAL_TABLET | Freq: Once | ORAL | Status: AC
  Administered 2012-07-14: 400 mg via ORAL
  Filled 2012-07-14: qty 1

## 2012-07-14 MED ORDER — ALPRAZOLAM 0.5 MG PO TABS
1.0000 mg | ORAL_TABLET | Freq: Three times a day (TID) | ORAL | Status: DC
  Administered 2012-07-14 – 2012-07-16 (×7): 1 mg via ORAL
  Filled 2012-07-14: qty 2
  Filled 2012-07-14 (×2): qty 1
  Filled 2012-07-14: qty 2
  Filled 2012-07-14: qty 1
  Filled 2012-07-14 (×2): qty 2

## 2012-07-14 MED ORDER — SODIUM CHLORIDE 0.9 % IV BOLUS (SEPSIS)
500.0000 mL | Freq: Once | INTRAVENOUS | Status: AC
  Administered 2012-07-14: 500 mL via INTRAVENOUS

## 2012-07-14 MED ORDER — METRONIDAZOLE IN NACL 5-0.79 MG/ML-% IV SOLN
500.0000 mg | Freq: Once | INTRAVENOUS | Status: AC
  Administered 2012-07-14: 500 mg via INTRAVENOUS
  Filled 2012-07-14: qty 100

## 2012-07-14 MED ORDER — ALPRAZOLAM 0.5 MG PO TABS
1.0000 mg | ORAL_TABLET | Freq: Four times a day (QID) | ORAL | Status: DC | PRN
  Filled 2012-07-14: qty 1
  Filled 2012-07-14: qty 2

## 2012-07-14 MED ORDER — PNEUMOCOCCAL VAC POLYVALENT 25 MCG/0.5ML IJ INJ
0.5000 mL | INJECTION | INTRAMUSCULAR | Status: AC
  Administered 2012-07-15: 0.5 mL via INTRAMUSCULAR
  Filled 2012-07-14: qty 0.5

## 2012-07-14 NOTE — ED Notes (Signed)
C/o L sided abd pain and headache since Tuesday.  Denies nausea and vomiting.  Pt states she has a history of diverticulitis.  Pt states PCP called Levaquin in for her and she has been taking it since Tuesday without improvement.

## 2012-07-14 NOTE — ED Provider Notes (Signed)
History     CSN: [id number]  Arrival date & time 07/14/12  0200   First MD Initiated Contact with Patient 07/14/12 0244      Chief Complaint  Patient presents with  . Abdominal Pain    (Consider location/radiation/quality/duration/timing/severity/associated sxs/prior treatment) The history is provided by the patient.  Alexa Wilson is a 66 y.o. female hx of diverticulitis, here with LLQ pain. LLQ pain x 3 days, intermittent, crampy pain. No radiation. Unable to tolerate PO for 3 days. No urinary symptoms or fever. She has some diarrhea initially then constipation and back to diarrhea. She was given levaquin without improvement. + nausea and some vomiting.    Past Medical History  Diagnosis Date  . Brown recluse spider bite   . Lyme disease   . Diverticulitis   . Panic disorder     Past Surgical History  Procedure Date  . Tonsillectomy   . Appendectomy   . Cholecystectomy     No family history on file.  History  Substance Use Topics  . Smoking status: Never Smoker   . Smokeless tobacco: Not on file  . Alcohol Use: No    OB History    Grav Para Term Preterm Abortions TAB SAB Ect Mult Living                  Review of Systems  Gastrointestinal: Positive for abdominal pain, diarrhea and constipation.  All other systems reviewed and are negative.    Allergies  Ciprofloxacin; Codeine; Metronidazole; and Other  Home Medications   Current Outpatient Rx  Name Route Sig Dispense Refill  . ALPRAZOLAM 2 MG PO TABS Oral Take 1-2 mg by mouth every 6 (six) hours. Scheduled doses May take half to whole tablet    . BIOTIN 2500 MCG PO CAPS Oral Take 1 capsule by mouth daily.    Marland Kitchen VITAMIN D 1000 UNITS PO TABS Oral Take 1,000 Units by mouth daily.    Marland Kitchen LEVOFLOXACIN 500 MG PO TABS Oral Take 500 mg by mouth daily. For 7 days beginning 07/12/12 diverticulitis    . ONE-DAILY MULTI VITAMINS PO TABS Oral Take 1 tablet by mouth daily.       BP 146/90  Pulse 98  Temp  98.2 F (36.8 C) (Oral)  Resp 18  SpO2 100%  Physical Exam  Nursing note and vitals reviewed. Constitutional: She is oriented to person, place, and time. She appears well-developed and well-nourished.  HENT:  Head: Normocephalic.  Mouth/Throat: Oropharynx is clear and moist.  Eyes: Conjunctivae normal are normal. Pupils are equal, round, and reactive to light.  Neck: Normal range of motion. Neck supple.  Cardiovascular: Normal rate, regular rhythm and normal heart sounds.   Pulmonary/Chest: Effort normal and breath sounds normal.  Abdominal: Soft.       + LLQ tenderness, no rebound. No CVAT  Musculoskeletal: Normal range of motion.  Neurological: She is alert and oriented to person, place, and time.  Skin: Skin is warm and dry.  Psychiatric: She has a normal mood and affect. Her behavior is normal. Judgment and thought content normal.    ED Course  Procedures (including critical care time)  Labs Reviewed  URINALYSIS, ROUTINE W REFLEX MICROSCOPIC - Abnormal; Notable for the following:    APPearance CLOUDY (*)     Leukocytes, UA MODERATE (*)     All other components within normal limits  CBC - Abnormal; Notable for the following:    WBC 12.8 (*)  All other components within normal limits  COMPREHENSIVE METABOLIC PANEL - Abnormal; Notable for the following:    Glucose, Bld 100 (*)     Alkaline Phosphatase 120 (*)     GFR calc non Af Amer 68 (*)     GFR calc Af Amer 79 (*)     All other components within normal limits  URINE MICROSCOPIC-ADD ON - Abnormal; Notable for the following:    Squamous Epithelial / LPF MANY (*)     Bacteria, UA FEW (*)     All other components within normal limits   Ct Abdomen Pelvis W Contrast  07/14/2012  *RADIOLOGY REPORT*  Clinical Data: Left lower quadrant pain.  CT ABDOMEN AND PELVIS WITH CONTRAST  Technique:  Multidetector CT imaging of the abdomen and pelvis was performed following the standard protocol during bolus administration of  intravenous contrast.  Contrast: OMNIPAQUE IOHEXOL 300 MG/ML  SOLN  Comparison: Previous comparison studies are not available.  Findings: Atelectasis in the lung bases.  Small esophageal hiatal hernia.  Surgical absence of the gallbladder with mild physiologic dilatation of bile ducts.  Pancreatic ducts are upper limits of normal.  The liver, spleen, pancreas, adrenal glands, kidneys, and retroperitoneal lymph nodes are unremarkable.  Calcification of the abdominal aorta without aneurysm.  No free air or free fluid in the abdomen.  The stomach, small bowel, and colon are not abnormally distended.  Pelvis:  There is diverticulosis of the sigmoid colon with inflammatory infiltration and wall thickening involving the junction of the distal descending and sigmoid colon.  Changes are likely to represent diverticulitis although there is some shouldering of the margins of the duodenal process and follow-up examination of the colon after resolution of acute findings is recommended to exclude a neoplasm.  No evidence of pericolonic abscess.  No free pelvic fluid collections.  Bladder wall is not thickened.  Uterus and ovaries are not enlarged.  The appendix is not visualized.  Normal alignment of lumbar spine.  IMPRESSION: Diverticulosis of sigmoid colon with inflammatory process of the upper sigmoid consistent with diverticulitis.  Wall thickening is also present and follow-up evaluation of the colon after resolution of acute process is recommended to exclude underlying neoplasm.   Original Report Authenticated By: Marlon Pel, M.D.      1. Diverticulitis   2. UTI (urinary tract infection)       MDM  Alexa Wilson is a 66 y.o. female here with LLQ pain r/o diverticulitis. Will do CT ab/pel, labs, UA and reassess.   7:23 AM Patient's labs showed WBC 12, + UTI. CT showed diverticulitis. Patient given ceftriaxone and flagyl (she felt nauseous with flagyl, not a real allergy). She still can't  tolerate PO and feels very weak. Will admit for IV antibiotics.   7:40 AM I discussed with Dr. Nedra Hai from internal medicine, who accepted the patient and will examine the patient in the ED.         Richardean Canal, MD 07/14/12 [phone]

## 2012-07-14 NOTE — H&P (Signed)
Hospital Admission Note Date: 07/14/2012  Patient name: Alexa Wilson Medical record number: [id number] Date of birth: July 28, 1946 Age: 66 y.o. Gender: female PCP: Ignatius Specking., MD  Medical Service: Internal Medicine Teaching Service   Attending physician:  Dr Lonzo Cloud     1st Contact: Dr Earlene Plater   Pager: [phone] 2nd Contact: Dr Dierdre Searles    Pager: [phone]  After 5 pm or weekends: 1st Contact:      Pager: [phone] 2nd Contact:      Pager: [phone]  Chief Complaint: Abdominal Pain  History of Present Illness: This is a 66 year old female with PMH significant for Diverticulosis, Recurrent diverticulitis ( since 2000) , Panic Disorderl, IBS Presented to the emergency room with abdominal pain. Patient reports that she was experiencing left lower quadrant abdominal pain 4-5 days ago along with increased urinary frequency. She called her primary care physician who prescribed Levaquin. She took 2 doses and noted that her symptoms did not improve. Since last night her abdominal pain has worsen, endorses nausea and episode of vomiting this morning which was nonbloody and nonbilious. She overall felt really fatigued and wanted to be transferred to the hospital which is unusual for her as her son noted who was present during the evaluation. The abdominal pain is diffuse, sharp, dull in character and moves like a wave to her belly. He can be 15/10 in severity but after receiving pain medication in the ED she noted that her abdominal pain has improved. . Reports that she has diverticulitis in the past and it felt similar. She endorses some increased urinary frequency but denies any burning sensation, hematuria or flank pain. She has chronic constipation with occasional diarrhea. Last bowel movement was yesterday which was nonbloody. She takes occasionally MiraLax. She further reports about some mild headache which is somewhat throbbing pain which started yesterday. Denies any vision changes, weakness or numbness.  Denies any dizziness or syncope. The son noted that she has been drinking only soda's and chocolate ice cream in the last few days.   Meds: Current Outpatient Rx  Name Route Sig Dispense Refill  . ALPRAZOLAM 2 MG PO TABS Oral Take 1-2 mg by mouth every 6 (six) hours. Scheduled doses May take half to whole tablet    . BIOTIN 2500 MCG PO CAPS Oral Take 1 capsule by mouth daily.    Marland Kitchen VITAMIN D 1000 UNITS PO TABS Oral Take 1,000 Units by mouth daily.    Marland Kitchen LEVOFLOXACIN 500 MG PO TABS Oral Take 500 mg by mouth daily. For 7 days beginning 07/12/12 diverticulitis     MIRA LAX   PRN     Compezine   prn    . ONE-DAILY MULTI VITAMINS PO TABS Oral Take 1 tablet by mouth daily.       Allergies: Allergies as of 07/14/2012 - Review Complete 07/14/2012  Allergen Reaction Noted  . Ciprofloxacin Nausea Only   . Codeine Other (See Comments)   . Metronidazole Nausea Only   . Other Other (See Comments) 07/14/2012   Past Medical History  Diagnosis Date  . Brown recluse spider bite   . Lyme disease   . Diverticulitis   . Panic disorder   . Shingles    Past Surgical History  Procedure Date  . Tonsillectomy   . Appendectomy   . Cholecystectomy    Family History  Problem Relation Age of Onset  . Diabetes Brother   . Diabetes Son   . Diabetes Mother   . Hypertension Mother   .  Heart attack Mother   . Cancer Father     Melanoma    History   Social History  . Marital Status: Widowed    Spouse Name: N/A    Number of Children: N/A  . Years of Education: N/A   Occupational History  . Not on file.   Social History Main Topics  . Smoking status: Never Smoker   . Smokeless tobacco: Not on file  . Alcohol Use: No  . Drug Use: No  . Sexually Active:    Other Topics Concern  . Not on file   Social History Narrative   Patient lives in [place] with son.      Review of Systems: Bold if positive Constitutional:  fever, chills, diaphoresis, appetite change and fatigue.  Respiratory:   SOB, DOE, cough, chest tightness,  and wheezing.   Cardiovascular:  chest pain, palpitations and leg swelling.  Gastrointestinal:  nausea, vomiting, abdominal pain, diarrhea, constipation, blood in stool and abdominal distention.  Genitourinary:dysuria, urgency, frequency, hematuria, flank pain and difficulty urinating.  Skin: Denies pallor, rash and wound.  Neurological:dizziness,syncope, weakness, , numbness and headaches.  Hematological:  adenopathy.  Psychiatric/Behavioral:  suicidal ideation,  nervousness     Physical Exam: Blood pressure 109/65, pulse 91, temperature 97.9 F (36.6 C), temperature source Oral, resp. rate 19, SpO2 98.00%.  Constitutional: Vital signs reviewed.  Patient is a well-developed and well-nourished  in no acute distress and cooperative with exam. Alert and oriented x3.  Mouth: no erythema or exudates, mucous membrane dry Eyes: PERRL, EOMI, conjunctivae normal, No scleral icterus.  Neck: Supple,  Cardiovascular: RRR, S1 normal, S2 normal, no MRG, pulses symmetric and intact bilaterally Pulmonary/Chest: CTAB, no wheezes, rales, or rhonchi Abdominal: Soft. Mild tenderness in the left lower quadrant area on palpation,bowel sounds decreased GU: no CVA tenderness Hematology: no cervical adenopathy.  Neurological: A&O x3, Strenght is normal and symmetric bilaterally,no focal motor deficit, sensory intact to light touch bilaterally.  Skin: Warm, dry and intact. No rash, cyanosis, or clubbing.  Psychiatric: Normal mood and affect. speech  is normal.   Lab results: Basic Metabolic Panel:  Basename 07/14/12 0224  NA 140  K 3.9  CL 101  CO2 27  GLUCOSE 100*  BUN 14  CREATININE 0.87  CALCIUM 10.3  MG --  PHOS --   Liver Function Tests:  Basename 07/14/12 0224  AST 35  ALT 19  ALKPHOS 120*  BILITOT 0.6  PROT 8.2  ALBUMIN 4.3    CBC:  Basename 07/14/12 0224  WBC 12.8*  NEUTROABS --  HGB 13.7  HCT 40.3  MCV 96.2  PLT 351    Urinalysis:  Basename 07/14/12 0852 07/14/12 0233  COLORURINE YELLOW YELLOW  LABSPEC 1.010 1.016  PHURINE 5.0 5.5  GLUCOSEU NEGATIVE NEGATIVE  HGBUR NEGATIVE NEGATIVE  BILIRUBINUR NEGATIVE NEGATIVE  KETONESUR NEGATIVE NEGATIVE  PROTEINUR NEGATIVE NEGATIVE  UROBILINOGEN 0.2 0.2  NITRITE NEGATIVE NEGATIVE  LEUKOCYTESUR NEGATIVE MODERATE*    Imaging results:  Ct Abdomen Pelvis W Contrast  07/14/2012  *RADIOLOGY REPORT*  Clinical Data: Left lower quadrant pain.  CT ABDOMEN AND PELVIS WITH CONTRAST  Technique:  Multidetector CT imaging of the abdomen and pelvis was performed following the standard protocol during bolus administration of intravenous contrast.  Contrast: OMNIPAQUE IOHEXOL 300 MG/ML  SOLN  Comparison: Previous comparison studies are not available.  Findings: Atelectasis in the lung bases.  Small esophageal hiatal hernia.  Surgical absence of the gallbladder with mild physiologic dilatation of bile ducts.  Pancreatic ducts are upper limits of normal.  The liver, spleen, pancreas, adrenal glands, kidneys, and retroperitoneal lymph nodes are unremarkable.  Calcification of the abdominal aorta without aneurysm.  No free air or free fluid in the abdomen.  The stomach, small bowel, and colon are not abnormally distended.  Pelvis:  There is diverticulosis of the sigmoid colon with inflammatory infiltration and wall thickening involving the junction of the distal descending and sigmoid colon.  Changes are likely to represent diverticulitis although there is some shouldering of the margins of the duodenal process and follow-up examination of the colon after resolution of acute findings is recommended to exclude a neoplasm.  No evidence of pericolonic abscess.  No free pelvic fluid collections.  Bladder wall is not thickened.  Uterus and ovaries are not enlarged.  The appendix is not visualized.  Normal alignment of lumbar spine.  IMPRESSION: Diverticulosis of sigmoid colon with  inflammatory process of the upper sigmoid consistent with diverticulitis.  Wall thickening is also present and follow-up evaluation of the colon after resolution of acute process is recommended to exclude underlying neoplasm.   Original Report Authenticated By: Marlon Pel, M.D.      Assessment & Plan by Problem:  1. Abdominal pain with nausea and vomiting likely due to diverticulitis. CT is notable for diverticulosis of sigmoid colon with inflammatory process of the upper sigmoid. Wall thickening is also present. Patient had recurrent diverticulitis in the past with chronic diverticulosis per colonoscopy 01/2011 performed by Dr. Roetta Sessions [facility]) : External hemorrhoids, left-sided diverticula status post snare polypectomy left colon polyp. Remainder of the colonic mucosa and terminal ileal mucosa appeared normal. The colon polyp was benign per pathology report. Other differential diagnoses include small bowel obstruction although not noted on CT, urine infection unlikely since urine analysis is negative for pyuria or gastroenteritis.  - Will admit patient to regular floor - IV antibiotics with Rocephin and Flagyl (patient reports nausea with Flagyl by mouth but no other allergies associated with it) - Keep n.p.o. for today -Zofran for nausea -Morphine for pain -Blood cultures - Colace and senna  2. Panic disorder: - Will continue Xanax 1 mg 3 times a day and every 6 hours when necessary and  3. DVT ppx; Lovenox   Signed: Jermani Eberlein 07/14/2012, 10:50 AM

## 2012-07-14 NOTE — H&P (Signed)
Internal Medicine teaching Service Attending Dr.Manar Smalling. I have personally examined the patient and reviewed the h and P documented by the Resident. In brief  Chief complaint:abdominal pain HOPI: nausea fatigue and abdominal pain. She was here to look after her son ( she lives in [place] ) who had brachial plexus surgery and feels bad that she is here in the hospital. 9 point review of system as documented in the Resident note. Social history admitting medication family history past surgical history allergies reviewed. Physical examination Notable for: mild left sided abdominal tenderness. Labs are significant for : leukocytosis Imaging is significant [id number] suggestive of diverticulitis EKG: not done A and P: GI : Agree with the management. Have to see if she develops significant nausea with flagyl. She states that she has been treated for H pylori in the past. Rest per resident documentation.

## 2012-07-14 NOTE — ED Notes (Signed)
Family at bedside. 

## 2012-07-14 NOTE — ED Notes (Signed)
Pt states she cannot take flagyl b/c it makes her nauseated even in iv form.

## 2012-07-14 NOTE — ED Notes (Signed)
Pt to CT

## 2012-07-14 NOTE — Progress Notes (Signed)
Pt arrived to the unit with admitting dx. Of Diverticulitis. Pt is alert and oriented x4. Ambulatory with stand by assist. Continent of bowel and bladder. Complain of moderate cramping pain in LLQ 3 out of 10. No skin break down noted. VS stable. Pt oriented to the unit and staff. Will cont to monitor.

## 2012-07-15 DIAGNOSIS — D126 Benign neoplasm of colon, unspecified: Secondary | ICD-10-CM

## 2012-07-15 LAB — CBC
Platelets: 305 10*3/uL (ref 150–400)
RBC: 3.42 MIL/uL — ABNORMAL LOW (ref 3.87–5.11)
WBC: 5.2 10*3/uL (ref 4.0–10.5)

## 2012-07-15 LAB — BASIC METABOLIC PANEL
CO2: 22 mEq/L (ref 19–32)
Chloride: 109 mEq/L (ref 96–112)
Potassium: 3.3 mEq/L — ABNORMAL LOW (ref 3.5–5.1)
Sodium: 143 mEq/L (ref 135–145)

## 2012-07-15 MED ORDER — LEVOFLOXACIN 750 MG PO TABS
750.0000 mg | ORAL_TABLET | Freq: Every day | ORAL | Status: DC
  Administered 2012-07-15 – 2012-07-17 (×3): 750 mg via ORAL
  Filled 2012-07-15 (×3): qty 1

## 2012-07-15 MED ORDER — POTASSIUM CHLORIDE CRYS ER 20 MEQ PO TBCR
40.0000 meq | EXTENDED_RELEASE_TABLET | ORAL | Status: AC
  Administered 2012-07-15: 40 meq via ORAL
  Filled 2012-07-15: qty 2

## 2012-07-15 MED ORDER — IBUPROFEN 400 MG PO TABS
400.0000 mg | ORAL_TABLET | Freq: Once | ORAL | Status: AC
  Administered 2012-07-15: 400 mg via ORAL
  Filled 2012-07-15: qty 1

## 2012-07-15 MED ORDER — METRONIDAZOLE 500 MG PO TABS
500.0000 mg | ORAL_TABLET | Freq: Three times a day (TID) | ORAL | Status: DC
  Administered 2012-07-15 – 2012-07-16 (×5): 500 mg via ORAL
  Filled 2012-07-15 (×13): qty 1

## 2012-07-15 NOTE — Progress Notes (Signed)
Subjective:    Interval Events:  Patient feels better today.  Abdominal pain is getting better.  Had a BM this AM that was not painful, required no straining, and was not bloody.  Pt denies N/V and dizziness.  Does have a slight headache.    Objective:    Vital Signs:   Temp:  [97.6 F (36.4 C)-98.6 F (37 C)] 97.6 F (36.4 C) (09/13 1000) Pulse Rate:  [70-104] 81  (09/13 1000) Resp:  [17-20] 20  (09/13 1000) BP: (108-144)/(55-74) 144/69 mmHg (09/13 1000) SpO2:  [94 %-100 %] 100 % (09/13 1000) Weight:  [176 lb 14.4 oz (80.241 kg)-178 lb 1.6 oz (80.786 kg)] 176 lb 14.4 oz (80.241 kg) (09/12 2155) Last BM Date: 07/13/12   Weights: 24-hour Weight change:   Filed Weights   07/14/12 1145 07/14/12 2155  Weight: 178 lb 1.6 oz (80.786 kg) 176 lb 14.4 oz (80.241 kg)     Intake/Output:   Intake/Output Summary (Last 24 hours) at 07/15/12 1125 Last data filed at 07/15/12 0900  Gross per 24 hour  Intake    120 ml  Output      0 ml  Net    120 ml       Physical Exam: General appearance: alert, cooperative and no distress Eyes: PERRL Resp: clear to auscultation bilaterally Cardio: regular rate and rhythm GI: soft, tenderness with palpation of LLQ; no rebound, guarding, or rigidity  Extremities: extremities normal, atraumatic, no cyanosis or edema    Labs: Basic Metabolic Panel:  Lab 07/15/12 1610 07/14/12 0224  NA 143 140  K 3.3* 3.9  CL 109 101  CO2 22 27  GLUCOSE 78 100*  BUN 10 14  CREATININE 0.65 0.87  CALCIUM 8.8 10.3  MG -- --  PHOS -- --   CBC:  Lab 07/15/12 0535 07/14/12 0224  WBC 5.2 12.8*  NEUTROABS -- --  HGB 10.9* 13.7  HCT 33.2* 40.3  MCV 97.1 96.2  PLT 305 351   Microbiology: Results for orders placed during the hospital encounter of 07/14/12  CULTURE, BLOOD (ROUTINE X 2)     Status: Normal (Preliminary result)   Collection Time   07/14/12  9:45 AM      Component Value Range Status Comment   Specimen Description BLOOD RIGHT ARM    Final    Special Requests BOTTLES DRAWN AEROBIC AND ANAEROBIC 10CC EA   Final    Culture  Setup Time 07/14/2012 13:29   Final    Culture     Final    Value:        BLOOD CULTURE RECEIVED NO GROWTH TO DATE CULTURE WILL BE HELD FOR 5 DAYS BEFORE ISSUING A FINAL NEGATIVE REPORT   Report Status PENDING   Incomplete   CULTURE, BLOOD (ROUTINE X 2)     Status: Normal (Preliminary result)   Collection Time   07/14/12  9:53 AM      Component Value Range Status Comment   Specimen Description BLOOD RIGHT HAND   Final    Special Requests     Final    Value: BOTTLES DRAWN AEROBIC AND ANAEROBIC 10CC BLUE 5CC RED   Culture  Setup Time 07/14/2012 13:29   Final    Culture     Final    Value:        BLOOD CULTURE RECEIVED NO GROWTH TO DATE CULTURE WILL BE HELD FOR 5 DAYS BEFORE ISSUING A FINAL NEGATIVE REPORT   Report Status PENDING  Incomplete      Medications:    Infusions:    . sodium chloride 150 mL/hr at 07/15/12 0835     Scheduled Medications:    . ALPRAZolam  1 mg Oral TID  . cefTRIAXone (ROCEPHIN)  IV  1 g Intravenous Q24H  . docusate sodium  100 mg Oral BID  . enoxaparin (LOVENOX) injection  40 mg Subcutaneous Q24H  . ibuprofen  400 mg Oral Once  . ibuprofen  400 mg Oral Once  . influenza  inactive virus vaccine  0.5 mL Intramuscular Tomorrow-1000  . metronidazole  500 mg Intravenous Q8H  . pneumococcal 23 valent vaccine  0.5 mL Intramuscular Tomorrow-1000  . senna  1 tablet Oral BID  . DISCONTD: iohexol  20 mL Oral Q1 Hr x 2     PRN Medications: acetaminophen, ALPRAZolam, morphine injection, ondansetron (ZOFRAN) IV   Assessment/ Plan:    1.   Diverticulitis:  Improving.  CT demonstrated diverticulosis of sigmoid colon with associate inflammatory changes and wall thickening.  We have advanced her diet this morning to liquids and will have her try full diet today at lunch.  Currently in IV ceftriaxone and metronidazole; we will consider transitioning to oral abx today.  2.    Prophylaxis:  Enoxaparin  3.   Disposition:  Pending advancement of diet and transition to oral abx   Length of Stay: 1 days   Signed by:  Dorthula Rue. Earlene Plater, MD PGY-I, Internal Medicine Pager [phone]  07/15/2012, 11:25 AM

## 2012-07-16 DIAGNOSIS — K5730 Diverticulosis of large intestine without perforation or abscess without bleeding: Secondary | ICD-10-CM

## 2012-07-16 DIAGNOSIS — K5732 Diverticulitis of large intestine without perforation or abscess without bleeding: Principal | ICD-10-CM

## 2012-07-16 DIAGNOSIS — R112 Nausea with vomiting, unspecified: Secondary | ICD-10-CM

## 2012-07-16 DIAGNOSIS — F410 Panic disorder [episodic paroxysmal anxiety] without agoraphobia: Secondary | ICD-10-CM

## 2012-07-16 LAB — CBC
Hemoglobin: 10.8 g/dL — ABNORMAL LOW (ref 12.0–15.0)
MCH: 31.9 pg (ref 26.0–34.0)
MCV: 95.3 fL (ref 78.0–100.0)
RBC: 3.39 MIL/uL — ABNORMAL LOW (ref 3.87–5.11)

## 2012-07-16 LAB — BASIC METABOLIC PANEL
CO2: 23 mEq/L (ref 19–32)
Chloride: 107 mEq/L (ref 96–112)
GFR calc non Af Amer: 89 mL/min — ABNORMAL LOW (ref >90)
Glucose, Bld: 103 mg/dL — ABNORMAL HIGH (ref 70–99)
Potassium: 3.6 mEq/L (ref 3.5–5.1)
Sodium: 141 mEq/L (ref 135–145)

## 2012-07-16 MED ORDER — ALPRAZOLAM 0.5 MG PO TABS: 1.0000 mg | ORAL_TABLET | Freq: Three times a day (TID) | ORAL | Status: DC

## 2012-07-16 MED ORDER — ALPRAZOLAM 0.5 MG PO TABS
1.0000 mg | ORAL_TABLET | Freq: Three times a day (TID) | ORAL | Status: DC
  Administered 2012-07-16 – 2012-07-17 (×3): 1 mg via ORAL
  Filled 2012-07-16 (×2): qty 1
  Filled 2012-07-16 (×2): qty 2

## 2012-07-16 NOTE — Progress Notes (Signed)
Subjective: Patient states that she has had frequency watery stools last night. Patient described as if " I am preparing for Colonoscopy." Still endorses mild left abdominal pain. States that her pain is no worse than before. No c/o nausea or vomiting. No fever or chills.   Objective: Vital signs in last 24 hours: Filed Vitals:   07/15/12 1729 07/15/12 2056 07/16/12 0547 07/16/12 0912  BP: 134/80 110/64 136/75 129/61  Pulse: 78 77 71 85  Temp: 97.9 F (36.6 C) 98.7 F (37.1 C) 98.3 F (36.8 C) 97.8 F (36.6 C)  TempSrc: Oral Oral Oral Oral  Resp: 18 18 18 18   Height:      Weight:  180 lb 12.4 oz (82 kg)    SpO2: 97% 95% 96% 96%   Weight change: 2 lb 10.8 oz (1.215 kg)  Intake/Output Summary (Last 24 hours) at 07/16/12 1630 Last data filed at 07/16/12 0900  Gross per 24 hour  Intake 6607.5 ml  Output      0 ml  Net 6607.5 ml   General appearance: alert, cooperative and no distress  Eyes: PERRL  Resp: clear to auscultation bilaterally  Cardio: regular rate and rhythm  GI: soft, mild tenderness with palpation of LLQ; no rebound, guarding, or rigidity, BS active x 4 Extremities: extremities normal, atraumatic, no cyanosis or edema  Lab Results: Basic Metabolic Panel:  Lab 07/16/12 1610 07/15/12 0535  Alexa Wilson 141 143  K 3.6 3.3*  CL 107 109  CO2 23 22  GLUCOSE 103* 78  BUN 11 10  CREATININE 0.69 0.65  CALCIUM 9.1 8.8  MG -- --  PHOS -- --   Liver Function Tests:  Lab 07/14/12 0224  AST 35  ALT 19  ALKPHOS 120*  BILITOT 0.6  PROT 8.2  ALBUMIN 4.3   CBC:  Lab 07/16/12 0512 07/15/12 0535  WBC 5.2 5.2  NEUTROABS -- --  HGB 10.8* 10.9*  HCT 32.3* 33.2*  MCV 95.3 97.1  PLT 312 305   Urinalysis:  Lab 07/14/12 0852 07/14/12 0233  COLORURINE YELLOW YELLOW  LABSPEC 1.010 1.016  PHURINE 5.0 5.5  GLUCOSEU NEGATIVE NEGATIVE  HGBUR NEGATIVE NEGATIVE  BILIRUBINUR NEGATIVE NEGATIVE  KETONESUR NEGATIVE NEGATIVE  PROTEINUR NEGATIVE NEGATIVE  UROBILINOGEN 0.2  0.2  NITRITE NEGATIVE NEGATIVE  LEUKOCYTESUR NEGATIVE MODERATE*   Micro Results: Recent Results (from the past 240 hour(s))  CULTURE, BLOOD (ROUTINE X 2)     Status: Normal (Preliminary result)   Collection Time   07/14/12  9:45 AM      Component Value Range Status Comment   Specimen Description BLOOD RIGHT ARM   Final    Special Requests BOTTLES DRAWN AEROBIC AND ANAEROBIC 10CC EA   Final    Culture  Setup Time 07/14/2012 13:29   Final    Culture     Final    Value:        BLOOD CULTURE RECEIVED NO GROWTH TO DATE CULTURE WILL BE HELD FOR 5 DAYS BEFORE ISSUING A FINAL NEGATIVE REPORT   Report Status PENDING   Incomplete   CULTURE, BLOOD (ROUTINE X 2)     Status: Normal (Preliminary result)   Collection Time   07/14/12  9:53 AM      Component Value Range Status Comment   Specimen Description BLOOD RIGHT HAND   Final    Special Requests     Final    Value: BOTTLES DRAWN AEROBIC AND ANAEROBIC 10CC BLUE 5CC RED   Culture  Setup Time 07/14/2012  13:29   Final    Culture     Final    Value:        BLOOD CULTURE RECEIVED NO GROWTH TO DATE CULTURE WILL BE HELD FOR 5 DAYS BEFORE ISSUING A FINAL NEGATIVE REPORT   Report Status PENDING   Incomplete    Studies/Results: No results found. Medications: I have reviewed the patient's current medications. Scheduled Meds:   . ALPRAZolam  1 mg Oral TID  . docusate sodium  100 mg Oral BID  . enoxaparin (LOVENOX) injection  40 mg Subcutaneous Q24H  . levofloxacin  750 mg Oral Daily  . metroNIDAZOLE  500 mg Oral Q8H  . potassium chloride  40 mEq Oral Q4H  . senna  1 tablet Oral BID  . DISCONTD: ALPRAZolam  1 mg Oral TID  . DISCONTD: ALPRAZolam  1 mg Oral TID   Continuous Infusions:   . DISCONTD: sodium chloride 75 mL/hr at 07/16/12 0821   PRN Meds:.acetaminophen, morphine injection, ondansetron (ZOFRAN) IV, DISCONTD: ALPRAZolam Assessment/Plan:  1. Diverticulitis: CT demonstrated diverticulosis of sigmoid colon with associate inflammatory  changes and wall thickening.  Clinically improving. Tolerated her regular diet well. Less LLQ pain. However, she has had almost continuous watery stools last night and the etiology is unknown. Patient was given her morning Levaquin and Flagyl doses and states that her Diarrhea is actually better. Thus, I doubt that her new oral ABX is the etiology for her diarrhea. I will not d/c her ABX. I Will check her C-Diff.  - will continue to let her eat her regular diet  - IVF D/C'd, may resume if her diarrhea re occur - C-diff PCR - plan to D/C in am  2. Panic disorder She has had panic attacks and been on Xanax scheduled dose since 1980's. She would like to have her home dose of Xanax.  - will order Xanax scheduled dose.  3. Prophylaxis: Enoxaparin   4. Disposition:  Maybe in am    LOS: 2 days   Alexa Wilson 07/16/2012, 4:30 PM

## 2012-07-17 LAB — CLOSTRIDIUM DIFFICILE BY PCR: Toxigenic C. Difficile by PCR: NEGATIVE

## 2012-07-17 MED ORDER — AMOXICILLIN-POT CLAVULANATE 875-125 MG PO TABS: 1.0000 | ORAL_TABLET | Freq: Two times a day (BID) | ORAL | Status: AC

## 2012-07-17 MED ORDER — ONDANSETRON HCL 4 MG PO TABS: 4.0000 mg | ORAL_TABLET | Freq: Three times a day (TID) | ORAL | Status: AC | PRN

## 2012-07-17 NOTE — Progress Notes (Signed)
Verbalized understanding of discharge instructions.  RX given to client.  Thanks, [employer]

## 2012-07-17 NOTE — Progress Notes (Signed)
S: doing ok, no N/V/Abd pain/ Diarrhea O: General: NAD      Lungs: CTA B/L      Heart: RRR, no M.G.R     Abd: soft, no tenderness. BS x 4     Ext: No edema A/P - ready to go home - discussed with ID oncall Dr. Orvan Falconer. Patient does not want to take Cipro or Flagyl. And it is ok to send her with Augmentin 875 1 tab BID x 10 days. - she has a f/u appt with her PCP next week. - written information on diverticulitis given to patient.

## 2012-07-18 NOTE — Discharge Summary (Signed)
Internal Medicine Teaching [facility] Discharge Note  Name: Alexa Wilson MRN: [id number] DOB: 14-Dec-1945 66 y.o.  Date of Admission: 07/14/2012  2:43 AM Date of Discharge: 07/17/2012 Attending Physician: Dr. Tacey Heap  Discharge Diagnosis: 1. Diverticulitis of Colon 2. History of panic disorder  Discharge Medications:   Medication List     As of 07/18/2012  8:04 AM    STOP taking these medications         LEVAQUIN 500 MG tablet   Generic drug: levofloxacin      TAKE these medications         alprazolam 2 MG tablet   Commonly known as: XANAX   Take 1-2 mg by mouth every 6 (six) hours. Scheduled doses  May take half to whole tablet      amoxicillin-clavulanate 875-125 MG per tablet   Commonly known as: AUGMENTIN   Take 1 tablet by mouth 2 (two) times daily.      Biotin 2500 MCG Caps   Take 1 capsule by mouth daily.      cholecalciferol 1000 UNITS tablet   Commonly known as: VITAMIN D   Take 1,000 Units by mouth daily.      multivitamin tablet   Take 1 tablet by mouth daily.      ondansetron 4 MG tablet   Commonly known as: ZOFRAN   Take 1 tablet (4 mg total) by mouth every 8 (eight) hours as needed for nausea.        Disposition and follow-up:   [name] was discharged from [facility] in stable condition.  At the hospital follow up visit please address 1. please evaluate the complete resolution of her diverticulitis. 2. please check medical noncompliance with Augmentin 3. she is given written information on diverticulitis upon discharge.  Follow-up Appointments:     Follow-up Information    Follow up with VYAS,DHRUV B., MD. On 07/22/2012. (INTERNAL MEDICINE - Your appointment is at 11:00 on Friday, September 20.)    Contact information:   [address] [place] [address] [phone]           Consultations:  None  Procedures Performed:  Ct Abdomen Pelvis W Contrast  07/14/2012  *RADIOLOGY REPORT*   Clinical Data: Left lower quadrant pain.  CT ABDOMEN AND PELVIS WITH CONTRAST  Technique:  Multidetector CT imaging of the abdomen and pelvis was performed following the standard protocol during bolus administration of intravenous contrast.  Contrast: OMNIPAQUE IOHEXOL 300 MG/ML  SOLN  Comparison: Previous comparison studies are not available.  Findings: Atelectasis in the lung bases.  Small esophageal hiatal hernia.  Surgical absence of the gallbladder with mild physiologic dilatation of bile ducts.  Pancreatic ducts are upper limits of normal.  The liver, spleen, pancreas, adrenal glands, kidneys, and retroperitoneal lymph nodes are unremarkable.  Calcification of the abdominal aorta without aneurysm.  No free air or free fluid in the abdomen.  The stomach, small bowel, and colon are not abnormally distended.  Pelvis:  There is diverticulosis of the sigmoid colon with inflammatory infiltration and wall thickening involving the junction of the distal descending and sigmoid colon.  Changes are likely to represent diverticulitis although there is some shouldering of the margins of the duodenal process and follow-up examination of the colon after resolution of acute findings is recommended to exclude a neoplasm.  No evidence of pericolonic abscess.  No free pelvic fluid collections.  Bladder wall is not thickened.  Uterus and ovaries are not  enlarged.  The appendix is not visualized.  Normal alignment of lumbar spine.  IMPRESSION: Diverticulosis of sigmoid colon with inflammatory process of the upper sigmoid consistent with diverticulitis.  Wall thickening is also present and follow-up evaluation of the colon after resolution of acute process is recommended to exclude underlying neoplasm.   Original Report Authenticated By: Marlon Pel, M.D.     Admission HPI:  This is a 66 year old female with PMH significant for Diverticulosis, Recurrent diverticulitis ( since 2000) , Panic Disorderl, IBS Presented  to the emergency room with abdominal pain. Patient reports that she was experiencing left lower quadrant abdominal pain 4-5 days ago along with increased urinary frequency. She called her primary care physician who prescribed Levaquin. She took 2 doses and noted that her symptoms did not improve. Since last night her abdominal pain has worsen, endorses nausea and episode of vomiting this morning which was nonbloody and nonbilious. She overall felt really fatigued and wanted to be transferred to the hospital which is unusual for her as her son noted who was present during the evaluation. The abdominal pain is diffuse, sharp, dull in character and moves like a wave to her belly. He can be 15/10 in severity but after receiving pain medication in the ED she noted that her abdominal pain has improved. . Reports that she has diverticulitis in the past and it felt similar. She endorses some increased urinary frequency but denies any burning sensation, hematuria or flank pain. She has chronic constipation with occasional diarrhea. Last bowel movement was yesterday which was nonbloody. She takes occasionally MiraLax. She further reports about some mild headache which is somewhat throbbing pain which started yesterday. Denies any vision changes, weakness or numbness. Denies any dizziness or syncope. The son noted that she has been drinking only soda's and chocolate ice cream in the last few days.  Hospital Course by problem list: 1. Diverticulitis of colon     Patient presented to the hospital with nausea, vomiting and LLQ abdominal pain on admission. CT was notable for diverticulosis of the sigmoid colon with inflammatory process the upper sigmoid, and wall thickening also present. Patient had recurrent diverticulitis in the past with chronic diverticulosis per colonoscopy 01/2011 performed by Dr. Roetta Sessions [facility] : External hemorrhoids, left-sided diverticula status post snare polypectomy left colon polyp. Remainder  of the colonic mucosa and terminal ileal mucosa appeared normal. The colon polyp was benign per pathology report.    The clinical manifestations was consistent with acute sigmoid colon diverticulitis.  Patient was treated with NPO for bowel rest, symptomatic treatment for nausea/vomiting/abdomen pain, IV fluids for hydration, IV antibiotics with Rocephin and Flagyl initially which were switched to oral Levaquin and Flagyl on second day of admission.  Patient developed frequent watery diarrhea during second day of admission which resolved spontaneously after her Colace and Senokot were stopped.  C-Diff PCR negative. The etiology of her diarrhea was likely related to her stool softener and Laxative.  She continued to improve throughout the course of hospitalization.  She is stable without any complaints upon discharge.  Patient does not want to take Cipro or Flagyl due to side effect of nausea in the past.  After discussing with ID on call Dr. Orvan Falconer, She is discharged on Augmentin 875/125 1 tab BID x 10 days. She is scheduled to see her PCP next week.   2. History of Panic disorder   Chronic history of panic disorder on Xanax 1 mg 3 times a day at  home.  Her home dose of Xanax was resumed during this hospitalization.  Patient is stable upon discharge.  Discharge Vitals:  BP 143/71  Pulse 86  Temp 98 F (36.7 C) (Oral)  Resp 16  Ht 5' 6.14" (1.68 m)  Wt 179 lb (81.194 kg)  BMI 28.77 kg/m2  SpO2 99%  Discharge Labs:  Results for orders placed during the hospital encounter of 07/14/12 (from the past 24 hour(s))  CLOSTRIDIUM DIFFICILE BY PCR     Status: Normal   Collection Time   07/17/12  9:35 AM      Component Value Range   C difficile by pcr NEGATIVE  NEGATIVE    Signed: Letitia Sabala 07/18/2012, 8:04 AM   Time Spent on Discharge: > 45 minutes

## 2012-07-20 LAB — CULTURE, BLOOD (ROUTINE X 2): Culture: NO GROWTH

## 2013-01-26 ENCOUNTER — Ambulatory Visit: Payer: Medicare Other | Admitting: Gastroenterology

## 2013-05-11 ENCOUNTER — Encounter (HOSPITAL_COMMUNITY): Payer: Self-pay

## 2013-05-11 ENCOUNTER — Emergency Department (HOSPITAL_COMMUNITY): Payer: Medicare Other

## 2013-05-11 ENCOUNTER — Emergency Department (HOSPITAL_COMMUNITY)
Admission: EM | Admit: 2013-05-11 | Discharge: 2013-05-12 | Disposition: A | Discharge status: Home / Self Care | Payer: Medicare Other | Attending: Emergency Medicine | Admitting: Emergency Medicine

## 2013-05-11 DIAGNOSIS — R0789 Other chest pain: Secondary | ICD-10-CM | POA: Insufficient documentation

## 2013-05-11 DIAGNOSIS — Z87828 Personal history of other (healed) physical injury and trauma: Secondary | ICD-10-CM | POA: Insufficient documentation

## 2013-05-11 DIAGNOSIS — F410 Panic disorder [episodic paroxysmal anxiety] without agoraphobia: Secondary | ICD-10-CM | POA: Insufficient documentation

## 2013-05-11 DIAGNOSIS — Z8619 Personal history of other infectious and parasitic diseases: Secondary | ICD-10-CM | POA: Insufficient documentation

## 2013-05-11 DIAGNOSIS — Z8719 Personal history of other diseases of the digestive system: Secondary | ICD-10-CM | POA: Insufficient documentation

## 2013-05-11 DIAGNOSIS — Z79899 Other long term (current) drug therapy: Secondary | ICD-10-CM | POA: Insufficient documentation

## 2013-05-11 LAB — CBC
HCT: 40.8 % (ref 36.0–46.0)
Hemoglobin: 14 g/dL (ref 12.0–15.0)
MCHC: 34.3 g/dL (ref 30.0–36.0)
Platelets: 347 10*3/uL (ref 150–400)
RDW: 12.8 % (ref 11.5–15.5)

## 2013-05-11 LAB — BASIC METABOLIC PANEL
CO2: 28 mEq/L (ref 19–32)
Calcium: 9.9 mg/dL (ref 8.4–10.5)
Creatinine, Ser: 0.84 mg/dL (ref 0.50–1.10)
GFR calc Af Amer: 82 mL/min — ABNORMAL LOW (ref >90)
Sodium: 140 mEq/L (ref 135–145)

## 2013-05-11 LAB — POCT I-STAT TROPONIN I: Troponin i, poc: 0.01 ng/mL (ref 0.00–0.08)

## 2013-05-11 NOTE — ED Provider Notes (Signed)
History    CSN: [id number] Arrival date & time 05/11/13  1842  First MD Initiated Contact with Patient 05/11/13 2327     Chief Complaint  Patient presents with  . Chest Pain   (Consider location/radiation/quality/duration/timing/severity/associated sxs/prior Treatment) HPI This is a 67 year old female with a long-standing history of pain in her arms, treated by an orthopedist. She also has a history of panic attacks which are associated with rapid heartbeat. She recently had an echocardiogram and EKG at her PCPs office which were normal. She will heart monitor for 2 weeks has not gotten the results of this yet.  She is here this evening with chest pain that began about 11 this morning. She described it as a tightness or squeezing. It was located below her breasts bilaterally and under her sternum. It was moderate in severity. There was no mitigating or exacerbating factor. There was no associated shortness of breath, diaphoresis or nausea. The pain resolved on its own about 10 PM. She is asymptomatic at the present time. She did have nausea this morning not associated with her chest pain. She states she frequently has such nausea.  Past Medical History  Diagnosis Date  . Brown recluse spider bite   . Lyme disease   . Diverticulitis   . Panic disorder   . Shingles    Past Surgical History  Procedure Laterality Date  . Tonsillectomy    . Appendectomy    . Cholecystectomy     Family History  Problem Relation Age of Onset  . Diabetes Brother   . Diabetes Son   . Diabetes Mother   . Hypertension Mother   . Heart attack Mother   . Cancer Father     Melanoma    History  Substance Use Topics  . Smoking status: Never Smoker   . Smokeless tobacco: Not on file  . Alcohol Use: No   OB History   Grav Para Term Preterm Abortions TAB SAB Ect Mult Living                 Review of Systems  All other systems reviewed and are negative.    Allergies  Ciprofloxacin; Codeine;  Metronidazole; Morphine and related; and Other  Home Medications   Current Outpatient Rx  Name  Route  Sig  Dispense  Refill  . alprazolam (XANAX) 2 MG tablet   Oral   Take 1-2 mg by mouth every 6 (six) hours. Scheduled doses May take half to whole tablet         . cholecalciferol (VITAMIN D) 1000 UNITS tablet   Oral   Take 1,000 Units by mouth daily.          BP 130/81  Pulse 78  Temp(Src) 98.3 F (36.8 C) (Oral)  Resp 16  Ht 5\' 6"  (1.676 m)  Wt 180 lb (81.647 kg)  BMI 29.07 kg/m2  SpO2 100%  Physical Exam General: Well-developed, well-nourished female in no acute distress; appearance consistent with age of record HENT: normocephalic, atraumatic Eyes: pupils equal round and reactive to light; extraocular muscles intact Neck: supple Heart: regular rate and rhythm Lungs: clear to auscultation bilaterally Abdomen: soft; nondistended; nontender; no masses or hepatosplenomegaly; bowel sounds present Extremities: No deformity; full range of motion; pulses normal; no edema Neurologic: Awake, alert and oriented; motor function intact in all extremities and symmetric; no facial droop Skin: Warm and dry Psychiatric: Normal mood and affect; circumstantial    ED Course  Procedures (including critical care time)  MDM   Nursing notes and vitals signs, including pulse oximetry, reviewed.  Summary of this visit's results, reviewed by myself:  Labs:  Results for orders placed during the hospital encounter of 05/11/13 (from the past 24 hour(s))  CBC     Status: None   Collection Time    05/11/13  6:57 PM      Result Value Range   WBC 5.8  4.0 - 10.5 K/uL   RBC 4.24  3.87 - 5.11 MIL/uL   Hemoglobin 14.0  12.0 - 15.0 g/dL   HCT 16.1  09.6 - 04.5 %   MCV 96.2  78.0 - 100.0 fL   MCH 33.0  26.0 - 34.0 pg   MCHC 34.3  30.0 - 36.0 g/dL   RDW 40.9  81.1 - 91.4 %   Platelets 347  150 - 400 K/uL  BASIC METABOLIC PANEL     Status: Abnormal   Collection Time    05/11/13   6:57 PM      Result Value Range   Sodium 140  135 - 145 mEq/L   Potassium 4.1  3.5 - 5.1 mEq/L   Chloride 103  96 - 112 mEq/L   CO2 28  19 - 32 mEq/L   Glucose, Bld 92  70 - 99 mg/dL   BUN 14  6 - 23 mg/dL   Creatinine, Ser 7.82  0.50 - 1.10 mg/dL   Calcium 9.9  8.4 - 95.6 mg/dL   GFR calc non Af Amer 71 (*) >90 mL/min   GFR calc Af Amer 82 (*) >90 mL/min  POCT I-STAT TROPONIN I     Status: None   Collection Time    05/11/13  7:08 PM      Result Value Range   Troponin i, poc 0.01  0.00 - 0.08 ng/mL   Comment 3           POCT I-STAT TROPONIN I     Status: None   Collection Time    05/12/13 12:17 AM      Result Value Range   Troponin i, poc 0.00  0.00 - 0.08 ng/mL   Comment 3           POCT I-STAT TROPONIN I     Status: None   Collection Time    05/12/13  3:35 AM      Result Value Range   Troponin i, poc 0.00  0.00 - 0.08 ng/mL   Comment 3             Imaging Studies: Dg Chest 2 View  05/11/2013   *RADIOLOGY REPORT*  Clinical Data: Chest pain.  Chest tightness.  CHEST - 2 VIEW  Comparison: None.  Findings: Heart and mediastinal contours are within normal limits. No focal opacities or effusions.  No acute bony abnormality.  IMPRESSION: No active cardiopulmonary disease.   Original Report Authenticated By: Charlett Nose, M.D.    Date: 05/11/2013 18:46 PM  Rate: 85  Rhythm: normal sinus rhythm  QRS Axis: normal  Intervals: normal  ST/T Wave abnormalities: normal  Conduction Disutrbances: none  Narrative Interpretation: unremarkable  Comparison with previous EKG: none available  4:03 AM HEART score 3 points. Patient's story is atypical for coronary pain and she has no risk factors. We will have her followup with her primary care physician who has been coordinating her workup with [facility].     Hanley Seamen, MD 05/12/13 [phone]

## 2013-05-11 NOTE — ED Notes (Signed)
Pt c/o squeezing pain under both breast radiating up into her central chest, neck, bilateral shoulders and arms, and nausea starting approx 1500 this pm. Pt reports she has increase pain in her Left arm x1 month. Pt has been seen at [facility] Internal Medicine, they have done a complete cardiac work up including wearing a heart monitor x2 weeks.

## 2013-05-12 LAB — POCT I-STAT TROPONIN I: Troponin i, poc: 0 ng/mL (ref 0.00–0.08)

## 2013-05-12 MED ORDER — ASPIRIN 81 MG PO CHEW
324.0000 mg | CHEWABLE_TABLET | Freq: Once | ORAL | Status: AC
  Administered 2013-05-12: 324 mg via ORAL
  Filled 2013-05-12: qty 4

## 2013-05-12 NOTE — ED Notes (Signed)
0215  Spoke to the pts son on the phone and he states that his mother will not leave this hospital until she is seen by a cardiologist that she has been having this problem for over 6 months and that something needs to be done.  I stated that he was more than welcome to come down here and speak to the DR but I could not guarantee that she would be seen by a cardiologist tonight.

## 2013-07-05 ENCOUNTER — Ambulatory Visit: Payer: Medicare Other | Admitting: Cardiology

## 2013-07-15 ENCOUNTER — Emergency Department (HOSPITAL_COMMUNITY): Payer: Medicare Other

## 2013-07-15 ENCOUNTER — Observation Stay (HOSPITAL_COMMUNITY): Payer: Medicare Other

## 2013-07-15 ENCOUNTER — Encounter (HOSPITAL_COMMUNITY): Payer: Self-pay | Admitting: Emergency Medicine

## 2013-07-15 ENCOUNTER — Observation Stay (HOSPITAL_COMMUNITY)
Admission: EM | Admit: 2013-07-15 | Discharge: 2013-07-16 | Disposition: A | Discharge status: Home / Self Care | Payer: Medicare Other | Attending: Internal Medicine | Admitting: Internal Medicine

## 2013-07-15 DIAGNOSIS — K3189 Other diseases of stomach and duodenum: Secondary | ICD-10-CM

## 2013-07-15 DIAGNOSIS — E785 Hyperlipidemia, unspecified: Secondary | ICD-10-CM | POA: Insufficient documentation

## 2013-07-15 DIAGNOSIS — D126 Benign neoplasm of colon, unspecified: Secondary | ICD-10-CM

## 2013-07-15 DIAGNOSIS — Z8719 Personal history of other diseases of the digestive system: Secondary | ICD-10-CM

## 2013-07-15 DIAGNOSIS — Z79899 Other long term (current) drug therapy: Secondary | ICD-10-CM | POA: Insufficient documentation

## 2013-07-15 DIAGNOSIS — Z860100 Personal history of colon polyps, unspecified: Secondary | ICD-10-CM

## 2013-07-15 DIAGNOSIS — Z9089 Acquired absence of other organs: Secondary | ICD-10-CM | POA: Insufficient documentation

## 2013-07-15 DIAGNOSIS — M79609 Pain in unspecified limb: Secondary | ICD-10-CM | POA: Insufficient documentation

## 2013-07-15 DIAGNOSIS — F410 Panic disorder [episodic paroxysmal anxiety] without agoraphobia: Secondary | ICD-10-CM

## 2013-07-15 DIAGNOSIS — H269 Unspecified cataract: Secondary | ICD-10-CM

## 2013-07-15 DIAGNOSIS — R1319 Other dysphagia: Secondary | ICD-10-CM

## 2013-07-15 DIAGNOSIS — R109 Unspecified abdominal pain: Secondary | ICD-10-CM

## 2013-07-15 DIAGNOSIS — R0602 Shortness of breath: Secondary | ICD-10-CM | POA: Insufficient documentation

## 2013-07-15 DIAGNOSIS — R112 Nausea with vomiting, unspecified: Secondary | ICD-10-CM

## 2013-07-15 DIAGNOSIS — R279 Unspecified lack of coordination: Secondary | ICD-10-CM | POA: Insufficient documentation

## 2013-07-15 DIAGNOSIS — R002 Palpitations: Secondary | ICD-10-CM | POA: Insufficient documentation

## 2013-07-15 DIAGNOSIS — I498 Other specified cardiac arrhythmias: Secondary | ICD-10-CM | POA: Insufficient documentation

## 2013-07-15 DIAGNOSIS — R079 Chest pain, unspecified: Secondary | ICD-10-CM

## 2013-07-15 DIAGNOSIS — R1013 Epigastric pain: Secondary | ICD-10-CM

## 2013-07-15 DIAGNOSIS — R634 Abnormal weight loss: Secondary | ICD-10-CM

## 2013-07-15 DIAGNOSIS — K5732 Diverticulitis of large intestine without perforation or abscess without bleeding: Secondary | ICD-10-CM

## 2013-07-15 DIAGNOSIS — K5730 Diverticulosis of large intestine without perforation or abscess without bleeding: Secondary | ICD-10-CM

## 2013-07-15 DIAGNOSIS — I079 Rheumatic tricuspid valve disease, unspecified: Secondary | ICD-10-CM | POA: Insufficient documentation

## 2013-07-15 DIAGNOSIS — R5381 Other malaise: Secondary | ICD-10-CM | POA: Insufficient documentation

## 2013-07-15 DIAGNOSIS — R0789 Other chest pain: Principal | ICD-10-CM

## 2013-07-15 DIAGNOSIS — K589 Irritable bowel syndrome without diarrhea: Secondary | ICD-10-CM

## 2013-07-15 DIAGNOSIS — R29898 Other symptoms and signs involving the musculoskeletal system: Secondary | ICD-10-CM

## 2013-07-15 HISTORY — DX: Hyperlipidemia, unspecified: E78.5

## 2013-07-15 LAB — URINALYSIS, ROUTINE W REFLEX MICROSCOPIC
Leukocytes, UA: NEGATIVE
Nitrite: NEGATIVE
Specific Gravity, Urine: 1.019 (ref 1.005–1.030)
Urobilinogen, UA: 0.2 mg/dL (ref 0.0–1.0)
pH: 6 (ref 5.0–8.0)

## 2013-07-15 LAB — COMPREHENSIVE METABOLIC PANEL
Albumin: 3.9 g/dL (ref 3.5–5.2)
BUN: 19 mg/dL (ref 6–23)
Calcium: 9.2 mg/dL (ref 8.4–10.5)
Creatinine, Ser: 0.86 mg/dL (ref 0.50–1.10)
GFR calc Af Amer: 80 mL/min — ABNORMAL LOW (ref >90)
Glucose, Bld: 91 mg/dL (ref 70–99)
Potassium: 3.5 mEq/L (ref 3.5–5.1)
Total Protein: 6.9 g/dL (ref 6.0–8.3)

## 2013-07-15 LAB — CBC WITH DIFFERENTIAL/PLATELET
Basophils Relative: 0 % (ref 0–1)
Eosinophils Absolute: 0.1 10*3/uL (ref 0.0–0.7)
Eosinophils Relative: 2 % (ref 0–5)
Hemoglobin: 13 g/dL (ref 12.0–15.0)
MCH: 31.9 pg (ref 26.0–34.0)
MCHC: 33.2 g/dL (ref 30.0–36.0)
MCV: 96.1 fL (ref 78.0–100.0)
Monocytes Relative: 7 % (ref 3–12)
Neutrophils Relative %: 56 % (ref 43–77)

## 2013-07-15 LAB — TROPONIN I: Troponin I: <0.3 ng/mL (ref <0.30)

## 2013-07-15 MED ORDER — VITAMIN D3 25 MCG (1000 UNIT) PO TABS
1000.0000 [IU] | ORAL_TABLET | Freq: Every morning | ORAL | Status: DC
  Administered 2013-07-16: 10:00:00 1000 [IU] via ORAL
  Filled 2013-07-15: qty 1

## 2013-07-15 MED ORDER — ALPRAZOLAM 0.5 MG PO TABS
1.0000 mg | ORAL_TABLET | Freq: Four times a day (QID) | ORAL | Status: DC | PRN
  Administered 2013-07-15: 18:00:00 2 mg via ORAL
  Filled 2013-07-15: qty 4

## 2013-07-15 MED ORDER — ONDANSETRON HCL 4 MG/2ML IJ SOLN
4.0000 mg | Freq: Three times a day (TID) | INTRAMUSCULAR | Status: DC | PRN
  Administered 2013-07-15: 4 mg via INTRAVENOUS
  Filled 2013-07-15: qty 2

## 2013-07-15 MED ORDER — ALPRAZOLAM 1 MG PO TABS: 1.0000 mg | ORAL_TABLET | ORAL | Status: DC | PRN

## 2013-07-15 MED ORDER — ASPIRIN 325 MG PO TABS
325.0000 mg | ORAL_TABLET | Freq: Every day | ORAL | Status: DC
  Administered 2013-07-16: 10:00:00 325 mg via ORAL
  Filled 2013-07-15 (×2): qty 1

## 2013-07-15 MED ORDER — ONDANSETRON HCL 4 MG/2ML IJ SOLN: 4.0000 mg | Freq: Three times a day (TID) | INTRAMUSCULAR | Status: DC | PRN

## 2013-07-15 MED ORDER — ENOXAPARIN SODIUM 40 MG/0.4ML ~~LOC~~ SOLN
40.0000 mg | SUBCUTANEOUS | Status: DC
  Administered 2013-07-15: 21:00:00 40 mg via SUBCUTANEOUS
  Filled 2013-07-15 (×2): qty 0.4

## 2013-07-15 MED ORDER — ACETAMINOPHEN 325 MG PO TABS: 650.0000 mg | ORAL_TABLET | Freq: Four times a day (QID) | ORAL | Status: DC | PRN

## 2013-07-15 MED ORDER — ALPRAZOLAM 1 MG PO TABS
1.0000 mg | ORAL_TABLET | ORAL | Status: DC | PRN
  Administered 2013-07-15 – 2013-07-16 (×3): 1 mg via ORAL
  Filled 2013-07-15 (×3): qty 1

## 2013-07-15 MED ORDER — LORAZEPAM 1 MG PO TABS
1.0000 mg | ORAL_TABLET | Freq: Once | ORAL | Status: AC
  Administered 2013-07-15: 1 mg via ORAL
  Filled 2013-07-15: qty 1

## 2013-07-15 NOTE — ED Provider Notes (Signed)
CSN: [id number]     Arrival date & time 07/15/13  1334 History   First MD Initiated Contact with Patient 07/15/13 1349     Chief Complaint  Patient presents with  . Chest Pain   (Consider location/radiation/quality/duration/timing/severity/associated sxs/prior Treatment) HPI Comments: Patient is difficult historian. She complains of chest pressure that has been intermittent for the past 3 days. She's unable; it lasts feel that several minutes at a time. Is not exertional. It is associated with pain and weakness in her right arm, palpitations, shortness of breath and nausea. She denies any diaphoresis, chills or fever. She states her right arm weakness is worse today. She also complains of chronic pain in her bilateral upper extremities she denies any cardiac history. Reports having a normal EKG and Holter monitor several years ago. She has a significant history of anxiety and panic disorder. She's never had a stress test or catheterization.  The history is provided by the patient and a relative. The history is limited by the condition of the patient.    Past Medical History  Diagnosis Date  . Brown recluse spider bite   . Lyme disease   . Diverticulitis   . Panic disorder   . Shingles   . Hyperlipidemia    Past Surgical History  Procedure Laterality Date  . Tonsillectomy    . Appendectomy    . Cholecystectomy     Family History  Problem Relation Age of Onset  . Diabetes Brother   . Diabetes Son   . Diabetes Mother   . Hypertension Mother   . Heart attack Mother   . Cancer Father     Melanoma    History  Substance Use Topics  . Smoking status: Never Smoker   . Smokeless tobacco: Not on file  . Alcohol Use: No   OB History   Grav Para Term Preterm Abortions TAB SAB Ect Mult Living                 Review of Systems  Constitutional: Negative for fever, activity change and appetite change.  HENT: Negative for congestion and rhinorrhea.   Respiratory: Positive for cough,  chest tightness and shortness of breath.   Cardiovascular: Positive for chest pain.  Gastrointestinal: Negative for nausea, vomiting and abdominal pain.  Genitourinary: Positive for dysuria. Negative for hematuria, vaginal bleeding and vaginal discharge.  Musculoskeletal: Negative for back pain.  Skin: Negative for rash.  Neurological: Negative for dizziness, weakness and headaches.  A complete 10 system review of systems was obtained and all systems are negative except as noted in the HPI and PMH.    Allergies  Ciprofloxacin; Codeine; Metronidazole; Morphine and related; and Other  Home Medications   Current Outpatient Rx  Name  Route  Sig  Dispense  Refill  . acetaminophen (TYLENOL) 325 MG tablet   Oral   Take 650 mg by mouth every 6 (six) hours as needed for pain.         Marland Kitchen alprazolam (XANAX) 2 MG tablet   Oral   Take 1-2 mg by mouth every 6 (six) hours. Scheduled doses May take half to whole tablet         . cholecalciferol (VITAMIN D) 1000 UNITS tablet   Oral   Take 1,000 Units by mouth every morning.          . hydrocortisone cream 1 %   Topical   Apply 1 application topically 2 (two) times daily. For eczema         .  levofloxacin (LEVAQUIN) 500 MG tablet   Oral   Take 500 mg by mouth daily.          BP 151/80  Pulse 74  Temp(Src) 97.1 F (36.2 C) (Oral)  Resp 19  SpO2 100% Physical Exam  Constitutional: She is oriented to person, place, and time. She appears well-developed and well-nourished.  Tearful, anxious  HENT:  Head: Normocephalic and atraumatic.  Mouth/Throat: Oropharynx is clear and moist. No oropharyngeal exudate.  Eyes: Conjunctivae and EOM are normal. Pupils are equal, round, and reactive to light.  Neck: Normal range of motion. Neck supple.  Cardiovascular: Normal rate, regular rhythm and normal heart sounds.   No murmur heard. Pulmonary/Chest: Effort normal and breath sounds normal. No respiratory distress. She exhibits tenderness.   Abdominal: Soft. There is no tenderness. There is no rebound and no guarding.  Musculoskeletal: Normal range of motion. She exhibits no edema and no tenderness.  Neurological: She is alert and oriented to person, place, and time. No cranial nerve deficit. She exhibits normal muscle tone. Coordination normal.  R arm grip strength 4/5, L arm grip strength 5/5 . 5/5 strength bilateral LE.  CN 2-12 intact.  Skin: Skin is warm.    ED Course  Procedures (including critical care time) Labs Review Labs Reviewed  COMPREHENSIVE METABOLIC PANEL - Abnormal; Notable for the following:    GFR calc non Af Amer 69 (*)    GFR calc Af Amer 80 (*)    All other components within normal limits  CBC WITH DIFFERENTIAL  TROPONIN I  URINALYSIS, ROUTINE W REFLEX MICROSCOPIC   Imaging Review Dg Chest 2 View  07/15/2013   *RADIOLOGY REPORT*  Clinical Data: Shortness of breath, weakness, central chest pressure  CHEST - 2 VIEW  Comparison: 05/11/2013  Findings: The heart size and vascular pattern are normal.  The lungs are clear.  No change from prior study.  IMPRESSION: No acute findings   Original Report Authenticated By: Esperanza Heir, M.D.   Ct Head Wo Contrast  07/15/2013   CLINICAL DATA:  Weakness, right arm pain  EXAM: CT HEAD WITHOUT CONTRAST  TECHNIQUE: Contiguous axial images were obtained from the base of the skull through the vertex without intravenous contrast.  COMPARISON:  None.  FINDINGS: No skull fracture is noted. Paranasal sinuses and mastoid air cells are unremarkable. No intracranial hemorrhage, mass effect or midline shift.  No acute infarction. No mass lesion is noted on this unenhanced scan. The gray and white-matter differentiation is preserved. No hydrocephalus.  IMPRESSION: No acute intracranial abnormality.   Electronically Signed   By: Natasha Mead   On: 07/15/2013 14:48    MDM   1. Chest pain   2. Right arm weakness    Patient is anxious and difficult historian. She endorses  left-sided chest pressure that had intermittent over the past 3 days associated with right arm weakness. The right arm weakness was worse today.  Unclear if she is weak in the arm or just having pain. EKG shows nonspecific T wave inversion lead 3. Troponin negative. CT head does not show any acute infarct.  Patient has some typical features of chest pain including pan exertion that resolves with rest. She does have EKG changes. She'll be admitted for cardiac rule out. Questionably having right arm weakness as well and may need MRI to assess. Discussed with Dr. Jomarie Longs.   Date: 07/15/2013  Rate: 78  Rhythm: normal sinus rhythm  QRS Axis: left  Intervals: normal  ST/T Wave  abnormalities: nonspecific ST/T changes  Conduction Disutrbances:none  Narrative Interpretation: T wave inversion lead 3  Old EKG Reviewed: changes noted    Glynn Octave, MD 07/15/13 1600

## 2013-07-15 NOTE — Consult Note (Signed)
CARDIOLOGY CONSULT NOTE  Patient ID: Alexa Wilson  MRN: [id number]  DOB: July 09, 1946  Admit date: 07/15/2013 Date of Consult: 07/15/2013  Primary Physician: Ignatius Specking., MD Primary Cardiologist: Jens Som (future initial appt scheduled) Primary Electrophysiologist:  none  Reason for Consultation: Chest Pain  History of Present Illness: Alexa Wilson is a 67 y.o. female with h/o diverticulosis, panic d/o, and dyslipidemia who presented to the ED today for SOB and chest pressure.  See H&P by Dr. Jomarie Longs for full details.    She states that on Thursday night when bringing in the trash cans she became acutely short of breath.  She rested for a while and the SOB resolved.  There were no assciated palpitations or CP with this episode.  Then Friday night she had chest pressure and palpitations associated with recurrence of her shortness of breath.  The pain came on suddenly at rest, and was mid sternal without radiation.  This eventually improved but CP continued on and off, and she presented to the ED today.   Prior to Thursday she had been in her usual state of health, and had noticed a decrease in exercise tolerance (although she is quite sedentary).  Denies orthopnea, PND, edema, lightheadedness or syncope.   Some increased fatigue.  She has had similar, perhaps less intense, intermittent chest pain/pressure/palpitation for 6 months, was seen in ER in July and subsequently supposed to see [facility] next month.  Per H&P previous holter monitor and echo were unremarkable, I do not see these records.   Cardiac Enzymes negative x 1, EKG without concerning ST-T wave changes.  She is placed in observation at [facility]   Past Medical History  Diagnosis Date  . Brown recluse spider bite   . Lyme disease   . Diverticulitis   . Panic disorder   . Shingles   . Hyperlipidemia     Past Surgical History  Procedure Laterality Date  . Tonsillectomy    . Appendectomy    .  Cholecystectomy       Home Meds: Prior to Admission medications   Medication Sig Start Date End Date Taking? Authorizing Provider  acetaminophen (TYLENOL) 325 MG tablet Take 650 mg by mouth every 6 (six) hours as needed for pain.   Yes Historical Provider, MD  alprazolam Prudy Feeler) 2 MG tablet Take 1-2 mg by mouth every 6 (six) hours. Scheduled doses May take half to whole tablet   Yes Historical Provider, MD  cholecalciferol (VITAMIN D) 1000 UNITS tablet Take 1,000 Units by mouth every morning.    Yes Historical Provider, MD  hydrocortisone cream 1 % Apply 1 application topically 2 (two) times daily. For eczema   Yes Historical Provider, MD  levofloxacin (LEVAQUIN) 500 MG tablet Take 500 mg by mouth daily. 07/10/13  Yes Historical Provider, MD    Current Medications: . aspirin  325 mg Oral Daily  . [START ON 07/16/2013] cholecalciferol  1,000 Units Oral q morning - 10a  . enoxaparin (LOVENOX) injection  40 mg Subcutaneous Q24H     Allergies:    Allergies  Allergen Reactions  . Ciprofloxacin Nausea Only  . Codeine Other (See Comments)    Increases anxiety Makes me feel all whoozie  . Metronidazole Nausea Only  . Morphine And Related Nausea And Vomiting  . Other Other (See Comments)    All antihistamines cause severe insomnia    Social History:   The patient  reports that she has never smoked. She does not have any smokeless  tobacco history on file. She reports that she does not drink alcohol or use illicit drugs.    Family History:   The patient's family history includes Cancer in her father; Diabetes in her brother, mother, and son; Heart attack in her mother; Hypertension in her mother.   ROS:  Please see the history of present illness.   All other systems reviewed and negative.   Vital Signs: Blood pressure 118/61, pulse 75, temperature 98.5 F (36.9 C), temperature source Oral, resp. rate 18, height 5\' 5"  (1.651 m), weight 81.1 kg (178 lb 12.7 oz), SpO2 99.00%.   PHYSICAL  EXAM: General:  Well nourished, well developed, in no acute distress HEENT: normal Lymph: no adenopathy Neck: no JVD Endocrine:  No thryomegaly Vascular: No carotid bruits; FA pulses 2+ bilaterally without bruits Cardiac:  normal S1, S2; RRR; no murmur Lungs:  Bibasilar mild end-expiratory crackles, otherwise clear Abd: soft, nontender, no hepatomegaly Ext: no edema Musculoskeletal:  No deformities, BUE and BLE strength normal and equal Skin: warm and dry Neuro:  CNs 2-12 intact, no focal abnormalities noted Psych:  Normal affect   EKG:  NSR without ST-T wave changes concerning for ischemia  Telemetry without events  CXR with clear lungs, normal heart size.  Labs:  Recent Labs  07/15/13 1450  TROPONINI <0.30   Lab Results  Component Value Date   WBC 5.4 07/15/2013   HGB 13.0 07/15/2013   HCT 39.1 07/15/2013   MCV 96.1 07/15/2013   PLT 304 07/15/2013    Recent Labs Lab 07/15/13 1450  NA 140  K 3.5  CL 106  CO2 26  BUN 19  CREATININE 0.86  CALCIUM 9.2  PROT 6.9  BILITOT 0.5  ALKPHOS 72  ALT 10  AST 18  GLUCOSE 91   No results found for this basename: CHOL, HDL, LDLCALC, TRIG   No results found for this basename: DDIMER    Radiology/Studies:  Dg Chest 2 View  07/15/2013   *RADIOLOGY REPORT*  Clinical Data: Shortness of breath, weakness, central chest pressure  CHEST - 2 VIEW  Comparison: 05/11/2013  Findings: The heart size and vascular pattern are normal.  The lungs are clear.  No change from prior study.  IMPRESSION: No acute findings   Original Report Authenticated By: Esperanza Heir, M.D.   Ct Head Wo Contrast  07/15/2013   CLINICAL DATA:  Weakness, right arm pain  EXAM: CT HEAD WITHOUT CONTRAST  TECHNIQUE: Contiguous axial images were obtained from the base of the skull through the vertex without intravenous contrast.  COMPARISON:  None.  FINDINGS: No skull fracture is noted. Paranasal sinuses and mastoid air cells are unremarkable. No intracranial  hemorrhage, mass effect or midline shift.  No acute infarction. No mass lesion is noted on this unenhanced scan. The gray and white-matter differentiation is preserved. No hydrocephalus.  IMPRESSION: No acute intracranial abnormality.   Electronically Signed   By: Natasha Mead   On: 07/15/2013 14:48    ASSESSMENT AND PLAN:  1. Chest Pain/SOB - Her chest pain is atypical for cardiac chest pain.  Agree with ROMI and TTE tomorrow.   - She is currently chest pain free, and I do not think she needs a stress test prior to discharge.  She should follow up with Dr. Jens Som and get a stress test (treadmill if possible) as an outpatient to both evaluate for CAD as well as exercise capacity.  Another alternative would be a cardiac CTA, but would favor stress.  Cath is  not indicated at this point. - Would discharge with Event monitor/Loop recorder to try to catch potential episodes of arrythmia as the etiology for her symptoms. - Given her acute onset CP/SOB, would evaluate for PE with D-Dimer prior to discharge. - Can continue aspirin 81mg  daily as outpatient - Mild end-expiratory rales on exam likely 2/2 atelectasis given normal CXR.  Kelly Splinter 07/15/2013 9:07 PM

## 2013-07-15 NOTE — ED Notes (Signed)
Per pt/family, chest pressure on left side-states very weak-started Thurs-saw PCP 4-5 months ago and had 24 hour monitor placed but does not know results-history of panic disorder

## 2013-07-15 NOTE — ED Notes (Signed)
Off floor for testing 

## 2013-07-15 NOTE — H&P (Addendum)
[facility] History and Physical  DAIJANAE RAFALSKI XBJ:[id number] DOB: 1945-12-24 DOA: 07/15/2013  Referring physician: EDP PCP: Ignatius Specking., MD    Chief Complaint: multiple symptoms including Chest pressure  HPI: Alexa Wilson is a 67 y.o. female with h/o Diverticulosis, Panic disorder, Dyslipidemia presents to the ER with her son today. She is a very poor and vague historian and history is in part provided by her son. She started experiencing Shortness of breath Thursday evening which was self limiting after some rest, then Friday night had chest pressure, palpitations associated with shortness of breath. The chest pain is mid sternal and under left breast, non radiating, without any alleviating and relieving symptoms. She has also been very fatigued. She has had similar, perhaps less intense, intermittent chest pain/pressure for 6 months, was seen in ER in July and subsequently supposed to see [facility] next month. Previously had echo and holter monitor,which were unrevealing. In addition, also has bilateral shoulder/arm pains, for couple of months, diagnosed with bursitis/arthritis by Dr.Supple and received cortisone injection. Since Thursday son noticed R arm weakness in addition to pain and generalized fatigue. In addition reports that she has been slightly off balance for past couple of days. Additionally, she has been having some vague L sided abdominal pain, she thought could be related to her diverticulosis and took some left over levaquin for 4-5days, this has since resolved.   Review of Systems: The patient denies anorexia, fever, weight loss,, vision loss, decreased hearing, hoarseness, chest pain, syncope,  peripheral edema, balance deficits, hemoptysis, abdominal pain, melena, hematochezia, severe indigestion/heartburn, hematuria, incontinence, genital sores, muscle weakness, suspicious skin lesions, transient blindness, difficulty walking,  depression, unusual weight change, abnormal bleeding, enlarged lymph nodes, angioedema, and breast masses.    Past Medical History  Diagnosis Date  . Brown recluse spider bite   . Lyme disease   . Diverticulitis   . Panic disorder   . Shingles   . Hyperlipidemia    Past Surgical History  Procedure Laterality Date  . Tonsillectomy    . Appendectomy    . Cholecystectomy     Social History:  reports that she has never smoked. She does not have any smokeless tobacco history on file. She reports that she does not drink alcohol or use illicit drugs. Currently lives with son  Allergies  Allergen Reactions  . Ciprofloxacin Nausea Only  . Codeine Other (See Comments)    Increases anxiety Makes me feel all whoozie  . Metronidazole Nausea Only  . Morphine And Related Nausea And Vomiting  . Other Other (See Comments)    All antihistamines cause severe insomnia    Family History  Problem Relation Age of Onset  . Diabetes Brother   . Diabetes Son   . Diabetes Mother   . Hypertension Mother   . Heart attack Mother in 80s   . Cancer Father     Melanoma     Prior to Admission medications   Medication Sig Start Date End Date Taking? Authorizing Provider  acetaminophen (TYLENOL) 325 MG tablet Take 650 mg by mouth every 6 (six) hours as needed for pain.   Yes Historical Provider, MD  alprazolam Prudy Feeler) 2 MG tablet Take 1-2 mg by mouth every 6 (six) hours. Scheduled doses May take half to whole tablet   Yes Historical Provider, MD  cholecalciferol (VITAMIN D) 1000 UNITS tablet Take 1,000 Units by mouth every morning.    Yes Historical Provider, MD  hydrocortisone cream 1 % Apply  1 application topically 2 (two) times daily. For eczema   Yes Historical Provider, MD  levofloxacin (LEVAQUIN) 500 MG tablet Take 500 mg by mouth daily. 07/10/13  Yes Historical Provider, MD   Physical Exam: Filed Vitals:   07/15/13 1349  BP: 151/80  Pulse: 74  Temp: 97.1 F (36.2 C)  Resp: 19      General:  AAOx3, no distress  HEENT: PERRLA, EOMI, no JVD  Cardiovascular: S1S2/RRR  Respiratory: CTAB  Abdomen: soft, Nt, BS present  Skin: no rashes  Musculoskeletal:no edema c/c  Psychiatric: anxious appearing  Neurologic: cranial nerves 2-12 intact  Motor 5/5 in LUE, 4/5 in RUE, 4+/5 in lower extremities  Sensory light touch intact  Reflexes 1-2plus b/l  Plantars -mute Gait not assessed  Labs on Admission:  Basic Metabolic Panel:  Recent Labs Lab 07/15/13 1450  NA 140  K 3.5  CL 106  CO2 26  GLUCOSE 91  BUN 19  CREATININE 0.86  CALCIUM 9.2   Liver Function Tests:  Recent Labs Lab 07/15/13 1450  AST 18  ALT 10  ALKPHOS 72  BILITOT 0.5  PROT 6.9  ALBUMIN 3.9   No results found for this basename: LIPASE, AMYLASE,  in the last 168 hours No results found for this basename: AMMONIA,  in the last 168 hours CBC:  Recent Labs Lab 07/15/13 1450  WBC 5.4  NEUTROABS 3.1  HGB 13.0  HCT 39.1  MCV 96.1  PLT 304   Cardiac Enzymes:  Recent Labs Lab 07/15/13 1450  TROPONINI <0.30    BNP (last 3 results) No results found for this basename: PROBNP,  in the last 8760 hours CBG: No results found for this basename: GLUCAP,  in the last 168 hours  Radiological Exams on Admission: Dg Chest 2 View  07/15/2013   *RADIOLOGY REPORT*  Clinical Data: Shortness of breath, weakness, central chest pressure  CHEST - 2 VIEW  Comparison: 05/11/2013  Findings: The heart size and vascular pattern are normal.  The lungs are clear.  No change from prior study.  IMPRESSION: No acute findings   Original Report Authenticated By: Esperanza Heir, M.D.   Ct Head Wo Contrast  07/15/2013   CLINICAL DATA:  Weakness, right arm pain  EXAM: CT HEAD WITHOUT CONTRAST  TECHNIQUE: Contiguous axial images were obtained from the base of the skull through the vertex without intravenous contrast.  COMPARISON:  None.  FINDINGS: No skull fracture is noted. Paranasal sinuses and  mastoid air cells are unremarkable. No intracranial hemorrhage, mass effect or midline shift.  No acute infarction. No mass lesion is noted on this unenhanced scan. The gray and white-matter differentiation is preserved. No hydrocephalus.  IMPRESSION: No acute intracranial abnormality.   Electronically Signed   By: Natasha Mead   On: 07/15/2013 14:48    EKG: Independently reviewed. T wave inversion in lead 3  Assessment/Plan  1. Atypical Chest pain/Shortness or breath/Palpitations -EKG with TWI in 3, cardiac enzymes negative x3. -only cardiac risk factor is Dyslipidemia/?elevated TGs per son, but not on medications -admit to Tele -cycle cardiac enzymes -check 2D ECHO -ASA 325mg  daily -check Lipids in am -Son requests cards eval, requested [facility] cards eval d/w Dr.Sivak  2. R arm weakness -suspect radiculopathy, since long standing R arm/and shoulder pain -but will check MRI brain, since concomitant gait changes noted by son to r/o intracranial pathology  3. Anxiety -continue xanax PRN  DVT porph: lovenox  Code Status:Full Code Family Communication: d/w son at bedside Disposition  Plan: observation  Time spent:  Ruweyda Macknight [facility] Pager [phone]  If 7PM-7AM, please contact night-coverage [email or web address] Password [facility] 07/15/2013, 4:46 PM

## 2013-07-15 NOTE — Progress Notes (Signed)
Pt admitted to 1401. Admission assessment unremarkable other than continued complaints of chest pressure. Patient's son expressed several concerns about the patient's care. Son adamant that cardiology be consulted, a cardiac cath be performed, and the physician be changed. He also stated that orders for Xanax were not sufficient, that the patient takes Xanax at home 1-2 mg every 4 hours. He expressed concern about her pain medication orders, and stated that "she usually takes Dilaudid when she's in the hospital". Current PRN pain medication orders are for Tylenol 650 mg every 6 hours PRN pain. MRI of the brain was ordered but patient and son refused it stating they "do not think that its necessary, this is not a stroke." Son stated that he would "stay AMA" if cardiology workup was not completed. Dr. Jomarie Longs paged regarding Xanax orders, cardiology consult, MRI refusal and physician change request. [place] [facility] was consulted and will see patient tonight. Orders for Xanax were modified to 1-2 mg every 4 hours PRN. Carnella [place], RN

## 2013-07-16 DIAGNOSIS — R0789 Other chest pain: Secondary | ICD-10-CM

## 2013-07-16 DIAGNOSIS — R079 Chest pain, unspecified: Secondary | ICD-10-CM

## 2013-07-16 LAB — LIPID PANEL
Cholesterol: 219 mg/dL — ABNORMAL HIGH (ref 0–200)
Triglycerides: 143 mg/dL (ref <150)

## 2013-07-16 LAB — BASIC METABOLIC PANEL
BUN: 17 mg/dL (ref 6–23)
CO2: 24 mEq/L (ref 19–32)
Calcium: 9 mg/dL (ref 8.4–10.5)
GFR calc non Af Amer: 75 mL/min — ABNORMAL LOW (ref >90)
Glucose, Bld: 91 mg/dL (ref 70–99)

## 2013-07-16 LAB — CBC
HCT: 37.7 % (ref 36.0–46.0)
Hemoglobin: 12.6 g/dL (ref 12.0–15.0)
MCH: 31.9 pg (ref 26.0–34.0)
MCHC: 33.4 g/dL (ref 30.0–36.0)
MCV: 95.4 fL (ref 78.0–100.0)

## 2013-07-16 MED ORDER — DOCUSATE SODIUM 100 MG PO CAPS
100.0000 mg | ORAL_CAPSULE | Freq: Every day | ORAL | Status: DC
  Administered 2013-07-16: 12:00:00 100 mg via ORAL
  Filled 2013-07-16: qty 1

## 2013-07-16 MED ORDER — PNEUMOCOCCAL VAC POLYVALENT 25 MCG/0.5ML IJ INJ: 0.5000 mL | INJECTION | INTRAMUSCULAR | Status: DC

## 2013-07-16 MED ORDER — DSS 100 MG PO CAPS: 100.0000 mg | ORAL_CAPSULE | Freq: Every day | ORAL | Status: DC

## 2013-07-16 MED ORDER — INFLUENZA VAC SPLIT QUAD 0.5 ML IM SUSP: 0.5000 mL | INTRAMUSCULAR | Status: DC

## 2013-07-16 MED ORDER — INFLUENZA VAC SPLIT QUAD 0.5 ML IM SUSP
0.5000 mL | Freq: Once | INTRAMUSCULAR | Status: AC
  Administered 2013-07-16: 15:00:00 0.5 mL via INTRAMUSCULAR
  Filled 2013-07-16: qty 0.5

## 2013-07-16 NOTE — Progress Notes (Signed)
Patient ID: Alexa Wilson, female   DOB: 09/29/1946, 67 y.o.   MRN: [id number]   SUBJECTIVE:    The patient is stable and feeling better today. I had a long discussion with her about her palpitations. She's had more marked palpitations on other evenings. Since being here she said sinus rhythm and some sinus tachycardia. She tells me that she has worn an event recorder through the office of Dr. Sherril Croon in [place]. We will obtain this information.   Filed Vitals:   07/15/13 1900 07/15/13 1907 07/15/13 2031 07/16/13 0446  BP:  132/73 118/61 115/62  Pulse:  69 75 75  Temp:  98.8 F (37.1 C) 98.5 F (36.9 C) 98 F (36.7 C)  TempSrc:  Oral Oral Oral  Resp:  19 18 18   Height: 5\' 5"  (1.651 m)     Weight: 178 lb 12.7 oz (81.1 kg)     SpO2:  100% 99% 98%    Intake/Output Summary (Last 24 hours) at 07/16/13 0738 Last data filed at 07/15/13 2033  Gross per 24 hour  Intake    240 ml  Output      0 ml  Net    240 ml    LABS: Basic Metabolic Panel:  Recent Labs  40/98/11 1450 07/16/13 0513  NA 140 139  K 3.5 3.6  CL 106 105  CO2 26 24  GLUCOSE 91 91  BUN 19 17  CREATININE 0.86 0.80  CALCIUM 9.2 9.0   Liver Function Tests:  Recent Labs  07/15/13 1450  AST 18  ALT 10  ALKPHOS 72  BILITOT 0.5  PROT 6.9  ALBUMIN 3.9   No results found for this basename: LIPASE, AMYLASE,  in the last 72 hours CBC:  Recent Labs  07/15/13 1450 07/16/13 0513  WBC 5.4 5.3  NEUTROABS 3.1  --   HGB 13.0 12.6  HCT 39.1 37.7  MCV 96.1 95.4  PLT 304 311   Cardiac Enzymes:  Recent Labs  07/15/13 1450 07/16/13 0513  TROPONINI <0.30 <0.30   BNP: No components found with this basename: POCBNP,  D-Dimer: No results found for this basename: DDIMER,  in the last 72 hours Hemoglobin A1C: No results found for this basename: HGBA1C,  in the last 72 hours Fasting Lipid Panel: No results found for this basename: CHOL, HDL, LDLCALC, TRIG, CHOLHDL, LDLDIRECT,  in the last 72 hours Thyroid  Function Tests: No results found for this basename: TSH, T4TOTAL, FREET3, T3FREE, THYROIDAB,  in the last 72 hours  RADIOLOGY: Dg Chest 2 View  07/15/2013   *RADIOLOGY REPORT*  Clinical Data: Shortness of breath, weakness, central chest pressure  CHEST - 2 VIEW  Comparison: 05/11/2013  Findings: The heart size and vascular pattern are normal.  The lungs are clear.  No change from prior study.  IMPRESSION: No acute findings   Original Report Authenticated By: Esperanza Heir, M.D.   Ct Head Wo Contrast  07/15/2013   CLINICAL DATA:  Weakness, right arm pain  EXAM: CT HEAD WITHOUT CONTRAST  TECHNIQUE: Contiguous axial images were obtained from the base of the skull through the vertex without intravenous contrast.  COMPARISON:  None.  FINDINGS: No skull fracture is noted. Paranasal sinuses and mastoid air cells are unremarkable. No intracranial hemorrhage, mass effect or midline shift.  No acute infarction. No mass lesion is noted on this unenhanced scan. The gray and white-matter differentiation is preserved. No hydrocephalus.  IMPRESSION: No acute intracranial abnormality.   Electronically Signed   By:  Liviu  Pop   On: 07/15/2013 14:48    PHYSICAL EXAM  patient is oriented to person time and place. Affect is normal. Lungs are clear. Respiratory effort is nonlabored. Cardiac exam reveals S1 and S2. There no clicks or significant murmurs. Abdomen is soft. There is no peripheral edema.   TELEMETRY:   I have reviewed telemetry today July 16, 2013. There is sinus rhythm. There is some sinus tachycardia.   ASSESSMENT AND PLAN:    Chest pressure    Cardiac enzymes are normal. At this time there is no definite proof of ischemia. The patient can be discharged home today. Our office will call her tomorrow to make an early post hospital visit. Originally she was to see Dr. Jens Som. That visit had been moved from September 3 to October. I will arrange for earlier visit for her. We will also obtain the  information from the office of Dr. Sherril Croon.    Right arm weakness     Etiology is not clear. This does not appear to be an ischemic issue.    Palpitations     Patient has palpitations. She has worn an event recorder through her primary care office. She was told that showed no significant abnormalities. We will obtain this information as part of the ongoing evaluation.  Willa Rough 07/16/2013 7:38 AM

## 2013-07-16 NOTE — Discharge Summary (Signed)
Physician Discharge Summary  Alexa Wilson:[id number] DOB: Apr 03, 1946 DOA: 07/15/2013  PCP: Alexa Specking., MD  Admit date: 07/15/2013 Discharge date: 07/16/2013  Time spent: 45 minutes  Recommendations for Outpatient Follow-up:  -Will be discharged home today. -Has been advised to follow up with cardiology as scheduled for consideration of placement of an event monitor.   Discharge Diagnoses:  Active Problems:   PANIC DISORDER   Chest pressure   Right arm weakness   Discharge Condition: Stable and improved   Filed Weights   07/15/13 1900  Weight: 81.1 kg (178 lb 12.7 oz)    History of present illness:  Alexa Wilson is a 67 y.o. female with h/o Diverticulosis, Panic disorder, Dyslipidemia presents to the ER with her son today.  She is a very poor and vague historian and history is in part provided by her son.  She started experiencing Shortness of breath Thursday evening which was self limiting after some rest, then Friday night had chest pressure, palpitations associated with shortness of breath.  The chest pain is mid sternal and under left breast, non radiating, without any alleviating and relieving symptoms.  She has also been very fatigued.  She has had similar, perhaps less intense, intermittent chest pain/pressure for 6 months, was seen in ER in July and subsequently supposed to see [facility] next month.  Previously had echo and holter monitor,which were unrevealing.  In addition, also has bilateral shoulder/arm pains, for couple of months, diagnosed with bursitis/arthritis by Dr.Supple and received cortisone injection. Since Thursday son noticed R arm weakness in addition to pain and generalized fatigue.  In addition reports that she has been slightly off balance for past couple of days.  Additionally, she has been having some vague L sided abdominal pain, she thought could be related to her diverticulosis and took some left over levaquin for  4-5days, this has since resolved. We were asked to admit her for further evaluation and management.   Hospital Course:   Chest Pain/SOB/Palpitations -Has ruled out for ACS. -ECHO without abnormalities other than grade 1 diastolic dysfunction. -I suspect anxiety is playing a large role in this. -Has been seen by cardiology and has been cleared to go home. -Patient has had 2 "episodes" of palpitations and SOB in the hospital without and events on her tele monitor. -Has follow up scheduled with cardiology.  Rest of chronic medical conditions are stable.  Procedures:  None   Consultations:  Cardiology.  Discharge Instructions  Discharge Orders   Future Appointments Provider Department Dept Phone   08/11/2013 10:00 AM Lewayne Bunting, MD [facility] Main Office [place]) [phone]   Future Orders Complete By Expires   Discontinue IV  As directed    Increase activity slowly  As directed        Medication List    STOP taking these medications       levofloxacin 500 MG tablet  Commonly known as:  LEVAQUIN      TAKE these medications       acetaminophen 325 MG tablet  Commonly known as:  TYLENOL  Take 650 mg by mouth every 6 (six) hours as needed for pain.     alprazolam 2 MG tablet  Commonly known as:  XANAX  - Take 1-2 mg by mouth every 6 (six) hours. Scheduled doses  - May take half to whole tablet     cholecalciferol 1000 UNITS tablet  Commonly known as:  VITAMIN D  Take 1,000 Units by  mouth every morning.     DSS 100 MG Caps  Take 100 mg by mouth daily.     hydrocortisone cream 1 %  Apply 1 application topically 2 (two) times daily. For eczema       Allergies  Allergen Reactions  . Ciprofloxacin Nausea Only  . Codeine Other (See Comments)    Increases anxiety Makes me feel all whoozie  . Metronidazole Nausea Only  . Morphine And Related Nausea And Vomiting  . Other Other (See Comments)    All antihistamines cause severe insomnia        Follow-up Information   Follow up with VYAS,DHRUV B., MD. Schedule an appointment as soon as possible for a visit in 2 weeks.   Specialty:  Internal Medicine   Contact information:   [address] [place] [address] [phone]       Follow up with Olga Millers, MD. (as scheduled)    Specialty:  Cardiology   Contact information:   [address] [place] [address] [phone]        The results of significant diagnostics from this hospitalization (including imaging, microbiology, ancillary and laboratory) are listed below for reference.    Significant Diagnostic Studies: Dg Chest 2 View  07/15/2013   *RADIOLOGY REPORT*  Clinical Data: Shortness of breath, weakness, central chest pressure  CHEST - 2 VIEW  Comparison: 05/11/2013  Findings: The heart size and vascular pattern are normal.  The lungs are clear.  No change from prior study.  IMPRESSION: No acute findings   Original Report Authenticated By: Esperanza Heir, M.D.   Ct Head Wo Contrast  07/15/2013   CLINICAL DATA:  Weakness, right arm pain  EXAM: CT HEAD WITHOUT CONTRAST  TECHNIQUE: Contiguous axial images were obtained from the base of the skull through the vertex without intravenous contrast.  COMPARISON:  None.  FINDINGS: No skull fracture is noted. Paranasal sinuses and mastoid air cells are unremarkable. No intracranial hemorrhage, mass effect or midline shift.  No acute infarction. No mass lesion is noted on this unenhanced scan. The gray and white-matter differentiation is preserved. No hydrocephalus.  IMPRESSION: No acute intracranial abnormality.   Electronically Signed   By: Natasha Mead   On: 07/15/2013 14:48    Microbiology: No results found for this or any previous visit (from the past 240 hour(s)).   Labs: Basic Metabolic Panel:  Recent Labs Lab 07/15/13 1450 07/16/13 0513  NA 140 139  K 3.5 3.6  CL 106 105  CO2 26 24  GLUCOSE 91 91  BUN 19 17  CREATININE 0.86 0.80  CALCIUM 9.2  9.0   Liver Function Tests:  Recent Labs Lab 07/15/13 1450  AST 18  ALT 10  ALKPHOS 72  BILITOT 0.5  PROT 6.9  ALBUMIN 3.9   No results found for this basename: LIPASE, AMYLASE,  in the last 168 hours No results found for this basename: AMMONIA,  in the last 168 hours CBC:  Recent Labs Lab 07/15/13 1450 07/16/13 0513  WBC 5.4 5.3  NEUTROABS 3.1  --   HGB 13.0 12.6  HCT 39.1 37.7  MCV 96.1 95.4  PLT 304 311   Cardiac Enzymes:  Recent Labs Lab 07/15/13 1450 07/16/13 0513 07/16/13 0740  TROPONINI <0.30 <0.30 <0.30   BNP: BNP (last 3 results) No results found for this basename: PROBNP,  in the last 8760 hours CBG: No results found for this basename: GLUCAP,  in the last 168 hours  SignedChaya Jan  [facility] Pager: [phone] 07/16/2013, 12:58 PM

## 2013-07-16 NOTE — Progress Notes (Signed)
  Echocardiogram 2D Echocardiogram has been performed.  Alexa Wilson 07/16/2013, 8:59 AM

## 2013-08-01 ENCOUNTER — Encounter: Payer: Medicare Other | Admitting: Physician Assistant

## 2013-08-11 ENCOUNTER — Ambulatory Visit: Payer: Medicare Other | Admitting: Cardiology

## 2013-08-11 ENCOUNTER — Ambulatory Visit (INDEPENDENT_AMBULATORY_CARE_PROVIDER_SITE_OTHER): Payer: Medicare Other | Admitting: Cardiology

## 2013-08-11 ENCOUNTER — Encounter: Payer: Self-pay | Admitting: Cardiology

## 2013-08-11 VITALS — BP 126/82 | HR 88 | Ht 66.0 in | Wt 178.8 lb

## 2013-08-11 DIAGNOSIS — R079 Chest pain, unspecified: Secondary | ICD-10-CM

## 2013-08-11 DIAGNOSIS — R0789 Other chest pain: Secondary | ICD-10-CM

## 2013-08-11 DIAGNOSIS — R002 Palpitations: Secondary | ICD-10-CM | POA: Insufficient documentation

## 2013-08-11 NOTE — Assessment & Plan Note (Signed)
Plan CardioNet. She apparently wore a monitor 4-5 months ago but did not have severe symptoms at that time.

## 2013-08-11 NOTE — Progress Notes (Signed)
      HPI: 67 year old female for fu of palpitations and chest pain. Admitted in September of 2014 with palpitations, dyspnea and chest pain. Patient ruled out for myocardial infarction with serial enzymes. Echocardiogram in September of 2014 showed normal LV function. There was grade 1 diastolic dysfunction. Trace mitral regurgitation. Chest x-ray September 2014 was normal. Laboratories in September 2014 showed normal hemoglobin, normal potassium and normal liver functions. Patient has had intermittent palpitations for years. However they're worse over the past 6 months. They occur in various times during the day and last several hours. They're described as her heart racing. She also complains of chest pain. This has been present for a long time as well but is worse recently. It is substernal and described as a squeezing. It lasts several hours and resolved spontaneously. It is not pleuritic, positional, related to food or exertional. There is associated shortness of breath but no nausea or diaphoresis. She does not have exertional chest pain. She does have some dyspnea on exertion. No syncope. Cardiology asked to evaluate.   Current Outpatient Prescriptions  Medication Sig Dispense Refill  . acetaminophen (TYLENOL) 325 MG tablet Take 650 mg by mouth every 6 (six) hours as needed for pain.      Marland Kitchen alprazolam (XANAX) 2 MG tablet Take 1-2 mg by mouth every 6 (six) hours. Scheduled doses May take half to whole tablet      . cholecalciferol (VITAMIN D) 1000 UNITS tablet Take 1,000 Units by mouth every morning. Two Tabs daily      . docusate sodium 100 MG CAPS Take 100 mg by mouth daily.  10 capsule  0  . hydrocortisone cream 1 % Apply 1 application topically as needed. For eczema       No current facility-administered medications for this visit.     Past Medical History  Diagnosis Date  . Brown recluse spider bite   . Lyme disease   . Diverticulitis   . Panic disorder   . Shingles   .  Hyperlipidemia   . Migraines     Past Surgical History  Procedure Laterality Date  . Tonsillectomy    . Appendectomy    . Cholecystectomy      History   Social History  . Marital Status: Widowed    Spouse Name: N/A    Number of Children: 5  . Years of Education: N/A   Occupational History  . Not on file.   Social History Main Topics  . Smoking status: Never Smoker   . Smokeless tobacco: Not on file  . Alcohol Use: No  . Drug Use: No  . Sexual Activity: Not on file   Other Topics Concern  . Not on file   Social History Narrative  . No narrative on file    ROS: no fevers or chills, productive cough, hemoptysis, dysphasia, odynophagia, melena, hematochezia, dysuria, hematuria, rash, seizure activity, orthopnea, PND, pedal edema, claudication. Remaining systems are negative.  Physical Exam: Well-developed well-nourished in no acute distress.  Skin is warm and dry.  HEENT is normal.  Neck is supple.  Chest is clear to auscultation with normal expansion.  Cardiovascular exam is regular rate and rhythm.  Abdominal exam nontender or distended. No masses palpated. Extremities show no edema. neuro grossly intact  ECG 04/21/13 sinus rhythm at a rate of 83. Left axis deviation. No ST changes.  07/15/2013-sinus rhythm, left axis deviation.

## 2013-08-11 NOTE — Assessment & Plan Note (Signed)
Symptoms are extremely atypical. Plan exercise treadmill for risk stratification.

## 2013-08-11 NOTE — Patient Instructions (Signed)
Your physician recommends that you schedule a follow-up appointment in: 8 WEEKS WITH DR Jens Som  Your physician has recommended that you wear an event monitor. Event monitors are medical devices that record the heart's electrical activity. Doctors most often [place] these monitors to diagnose arrhythmias. Arrhythmias are problems with the speed or rhythm of the heartbeat. The monitor is a small, portable device. You can wear one while you do your normal daily activities. This is usually used to diagnose what is causing palpitations/syncope (passing out).   Your physician has requested that you have an exercise tolerance test. For further information please visit [email or web address] Please also follow instruction sheet, as given.    Exercise Stress Electrocardiography An exercise stress test is a heart test (EKG) which is done while you are moving. You will walk on a treadmill. This test will tell your doctor how your heart does when it is forced to work harder and how much activity you can safely handle. BEFORE THE TEST  Wear shorts or athletic pants.  Wear comfortable tennis shoes.  Women need to wear a bra that allows patches to be put on under it. TEST  An EKG cable will be attached to your waist. This cable is hooked up to patches, which look like round stickers stuck to your chest.  You will be asked to walk on the treadmill.  You will walk until you are too tired or until you are told to stop.  Tell the doctor right away if you have:  Chest pain.  Leg cramps.  Shortness of breath.  Dizziness.  The test may last 30 minutes to 1 hour. The timing depends on your physical condition and the condition of your heart. AFTER THE TEST  You will rest for about 6 minutes. During this time, your heart rhythm and blood pressure will be checked.  The testing equipment will be removed from your body and you can get dressed.  You may go home or back to your hospital room. You may keep  doing all your usual activities as told by your doctor. Finding out the results of your test Ask when your test results will be ready. Make sure you get your test results. Document Released: 04/06/2008 Document Revised: 01/11/2012 Document Reviewed: 04/06/2008 [facility] Patient Information 2014 [place], [place].

## 2013-08-18 ENCOUNTER — Encounter: Payer: Self-pay | Admitting: Radiology

## 2013-08-18 ENCOUNTER — Encounter (INDEPENDENT_AMBULATORY_CARE_PROVIDER_SITE_OTHER): Payer: Medicare Other

## 2013-08-18 DIAGNOSIS — R002 Palpitations: Secondary | ICD-10-CM

## 2013-08-18 DIAGNOSIS — R079 Chest pain, unspecified: Secondary | ICD-10-CM

## 2013-08-18 NOTE — Progress Notes (Signed)
Patient ID: Alexa Wilson, female   DOB: Apr 26, 1946, 67 y.o.   MRN: [id number] E cardio Verite 30 day monitor applied

## 2013-09-20 ENCOUNTER — Encounter: Payer: Self-pay | Admitting: Nurse Practitioner

## 2013-09-20 ENCOUNTER — Ambulatory Visit (INDEPENDENT_AMBULATORY_CARE_PROVIDER_SITE_OTHER): Payer: Medicare Other | Admitting: Nurse Practitioner

## 2013-09-20 VITALS — BP 140/74 | HR 100 | Resp 20

## 2013-09-20 DIAGNOSIS — R079 Chest pain, unspecified: Secondary | ICD-10-CM

## 2013-09-20 DIAGNOSIS — R002 Palpitations: Secondary | ICD-10-CM

## 2013-09-20 NOTE — Progress Notes (Signed)
Exercise Treadmill Test  Pre-Exercise Testing Evaluation Rhythm: sinus tachycardia  Rate: 100     Test  Exercise Tolerance Test Ordering MD: Olga Millers, MD  Interpreting MD: Norma Fredrickson, NP  Unique Test No: 1  Treadmill:  1  Indication for ETT: chest pain - rule out ischemia  Contraindication to ETT: No   Stress Modality: exercise - treadmill  Cardiac Imaging Performed: non   Protocol: standard Bruce - maximal  Max BP:  175/69  Max MPHR (bpm):  154 85% MPR (bpm):  131  MPHR obtained (bpm):  179 % MPHR obtained:  116%  Reached 85% MPHR (min:sec):  45 sec Total Exercise Time (min-sec):  4 minutes  Workload in METS:  5.7 Borg Scale: 15  Reason ETT Terminated:  desired heart rate attained    ST Segment Analysis At Rest: normal ST segments - no evidence of significant ST depression With Exercise: no evidence of significant ST depression  Other Information Arrhythmia:  No; occasional PVC noted Angina during ETT:  absent (0) Quality of ETT:  diagnostic  ETT Interpretation:  normal - no evidence of ischemia by ST analysis  Comments: Patient presents today for routine GXT. Has had palpitations and atypical chest pain. Event monitor results pending.   Today the patient exercised on the standard Bruce protocol for a total of 4 minutes.  Poor exercise tolerance.  Adequate blood pressure response.  Clinically negative for chest pain. Test was stopped due to achievement of target HR.  EKG negative for ischemia. No significant arrhythmia noted.   Recommendations: Reassurance. Needs CV risk factor modification Consider beta blocker therapy  Keep appointment with Dr. Jens Som for later this week  Patient is agreeable to this plan and will call if any problems develop in the interim.   Rosalio Macadamia, RN, ANP-C [facility] [address] [place], [place]  [address]

## 2013-09-22 ENCOUNTER — Encounter: Payer: Self-pay | Admitting: Cardiology

## 2013-09-22 ENCOUNTER — Ambulatory Visit (INDEPENDENT_AMBULATORY_CARE_PROVIDER_SITE_OTHER): Payer: Medicare Other | Admitting: Cardiology

## 2013-09-22 VITALS — BP 120/80 | HR 88 | Ht 66.0 in | Wt 177.0 lb

## 2013-09-22 DIAGNOSIS — R002 Palpitations: Secondary | ICD-10-CM

## 2013-09-22 MED ORDER — METOPROLOL SUCCINATE ER 25 MG PO TB24: 25.0000 mg | ORAL_TABLET | Freq: Every day | ORAL | Status: DC

## 2013-09-22 NOTE — Assessment & Plan Note (Signed)
Monitor shows PACs and PVCs. We discussed these in depth. I explained that in the setting of normal LV function these are benign. However she is symptomatic with these. I will add 25 mg of Toprol daily to see if her symptoms improved.

## 2013-09-22 NOTE — Patient Instructions (Signed)
Your physician recommends that you schedule a follow-up appointment in: 3 MONTHS WITH DR CRENSHAW  START METOPROLOL 25 MG ONCE DAILY

## 2013-09-22 NOTE — Assessment & Plan Note (Signed)
Exercise treadmill negative.

## 2013-09-22 NOTE — Progress Notes (Signed)
      HPI: FU palpitations and chest pain. Admitted in September of 2014 with palpitations, dyspnea and chest pain. Patient ruled out for myocardial infarction with serial enzymes. TSH in April 2014 normal. Echocardiogram in September of 2014 showed normal LV function. There was grade 1 diastolic dysfunction. Trace mitral regurgitation. Chest x-ray September 2014 was normal. Laboratories in September 2014 showed normal hemoglobin, normal potassium and normal liver functions. Patient has had intermittent palpitations for years. Exercise treadmill in November of 2014 showed poor exercise tolerance. There was no chest pain, no ECG changes and no arrhythmias. Monitor in November of 2014 showed sinus rhythm with PACs and PVCs. Since I last saw her, she denies dyspnea or recurrent chest pain. No syncope. She continues to have palpitations described as a brief flutter. She did have some with a monitor in place.   Current Outpatient Prescriptions  Medication Sig Dispense Refill  . acetaminophen (TYLENOL) 325 MG tablet Take 650 mg by mouth every 6 (six) hours as needed for pain.      Marland Kitchen alprazolam (XANAX) 2 MG tablet Take 1-2 mg by mouth every 6 (six) hours. Scheduled doses May take half to whole tablet      . cholecalciferol (VITAMIN D) 1000 UNITS tablet Take 1,000 Units by mouth every morning. Two Tabs daily      . docusate sodium 100 MG CAPS Take 100 mg by mouth daily.  10 capsule  0  . hydrocortisone cream 1 % Apply 1 application topically as needed. For eczema       No current facility-administered medications for this visit.     Past Medical History  Diagnosis Date  . Brown recluse spider bite   . Lyme disease   . Diverticulitis   . Panic disorder   . Shingles   . Hyperlipidemia   . Migraines     Past Surgical History  Procedure Laterality Date  . Tonsillectomy    . Appendectomy    . Cholecystectomy      History   Social History  . Marital Status: Widowed    Spouse Name: N/A   Number of Children: 5  . Years of Education: N/A   Occupational History  . Not on file.   Social History Main Topics  . Smoking status: Never Smoker   . Smokeless tobacco: Not on file  . Alcohol Use: No  . Drug Use: No  . Sexual Activity: Not on file   Other Topics Concern  . Not on file   Social History Narrative  . No narrative on file    ROS: no fevers or chills, productive cough, hemoptysis, dysphasia, odynophagia, melena, hematochezia, dysuria, hematuria, rash, seizure activity, orthopnea, PND, pedal edema, claudication. Remaining systems are negative.  Physical Exam: Well-developed well-nourished in no acute distress.  Skin is warm and dry.  HEENT is normal.  Neck is supple.  Chest is clear to auscultation with normal expansion.  Cardiovascular exam is regular rate and rhythm.  Abdominal exam nontender or distended. No masses palpated. Extremities show no edema. neuro grossly intact

## 2013-10-16 ENCOUNTER — Telehealth: Payer: Self-pay | Admitting: Cardiology

## 2013-10-16 NOTE — Telephone Encounter (Signed)
Records rec From [facility], gave to Victorio Palm

## 2013-12-19 ENCOUNTER — Ambulatory Visit: Payer: Medicare Other | Admitting: Cardiology

## 2013-12-26 ENCOUNTER — Ambulatory Visit: Payer: Medicare Other | Admitting: Cardiology

## 2013-12-28 ENCOUNTER — Ambulatory Visit: Payer: Medicare Other | Admitting: Cardiology

## 2014-02-19 ENCOUNTER — Encounter (HOSPITAL_COMMUNITY): Payer: Self-pay | Admitting: Emergency Medicine

## 2014-02-19 ENCOUNTER — Emergency Department (HOSPITAL_COMMUNITY): Payer: Medicare Other

## 2014-02-19 ENCOUNTER — Emergency Department (HOSPITAL_COMMUNITY)
Admission: EM | Admit: 2014-02-19 | Discharge: 2014-02-19 | Disposition: A | Discharge status: Home / Self Care | Payer: Medicare Other | Attending: Emergency Medicine | Admitting: Emergency Medicine

## 2014-02-19 DIAGNOSIS — F410 Panic disorder [episodic paroxysmal anxiety] without agoraphobia: Secondary | ICD-10-CM | POA: Insufficient documentation

## 2014-02-19 DIAGNOSIS — K5732 Diverticulitis of large intestine without perforation or abscess without bleeding: Secondary | ICD-10-CM | POA: Insufficient documentation

## 2014-02-19 DIAGNOSIS — K5792 Diverticulitis of intestine, part unspecified, without perforation or abscess without bleeding: Secondary | ICD-10-CM

## 2014-02-19 DIAGNOSIS — E785 Hyperlipidemia, unspecified: Secondary | ICD-10-CM | POA: Insufficient documentation

## 2014-02-19 DIAGNOSIS — G43909 Migraine, unspecified, not intractable, without status migrainosus: Secondary | ICD-10-CM | POA: Insufficient documentation

## 2014-02-19 DIAGNOSIS — B029 Zoster without complications: Secondary | ICD-10-CM | POA: Insufficient documentation

## 2014-02-19 LAB — URINALYSIS, ROUTINE W REFLEX MICROSCOPIC
Bilirubin Urine: NEGATIVE
Glucose, UA: NEGATIVE mg/dL
Hgb urine dipstick: NEGATIVE
KETONES UR: NEGATIVE mg/dL
NITRITE: NEGATIVE
PH: 6 (ref 5.0–8.0)
Protein, ur: NEGATIVE mg/dL
SPECIFIC GRAVITY, URINE: 1.01 (ref 1.005–1.030)
UROBILINOGEN UA: 0.2 mg/dL (ref 0.0–1.0)

## 2014-02-19 LAB — CBC WITH DIFFERENTIAL/PLATELET
Basophils Absolute: 0 10*3/uL (ref 0.0–0.1)
Basophils Relative: 0 % (ref 0–1)
EOS ABS: 0 10*3/uL (ref 0.0–0.7)
EOS PCT: 0 % (ref 0–5)
HCT: 40.8 % (ref 36.0–46.0)
HEMOGLOBIN: 13.7 g/dL (ref 12.0–15.0)
LYMPHS ABS: 2.3 10*3/uL (ref 0.7–4.0)
Lymphocytes Relative: 25 % (ref 12–46)
MCH: 32.7 pg (ref 26.0–34.0)
MCHC: 33.6 g/dL (ref 30.0–36.0)
MCV: 97.4 fL (ref 78.0–100.0)
MONOS PCT: 7 % (ref 3–12)
Monocytes Absolute: 0.6 10*3/uL (ref 0.1–1.0)
Neutro Abs: 6.2 10*3/uL (ref 1.7–7.7)
Neutrophils Relative %: 68 % (ref 43–77)
Platelets: 292 10*3/uL (ref 150–400)
RBC: 4.19 MIL/uL (ref 3.87–5.11)
RDW: 12.6 % (ref 11.5–15.5)
WBC: 9.2 10*3/uL (ref 4.0–10.5)

## 2014-02-19 LAB — COMPREHENSIVE METABOLIC PANEL
ALK PHOS: 91 U/L (ref 39–117)
ALT: 14 U/L (ref 0–35)
AST: 23 U/L (ref 0–37)
Albumin: 4.1 g/dL (ref 3.5–5.2)
BUN: 11 mg/dL (ref 6–23)
CALCIUM: 9.5 mg/dL (ref 8.4–10.5)
CO2: 25 mEq/L (ref 19–32)
Chloride: 108 mEq/L (ref 96–112)
Creatinine, Ser: 0.8 mg/dL (ref 0.50–1.10)
GFR calc non Af Amer: 75 mL/min — ABNORMAL LOW (ref >90)
GFR, EST AFRICAN AMERICAN: 86 mL/min — AB (ref >90)
GLUCOSE: 95 mg/dL (ref 70–99)
Potassium: 4 mEq/L (ref 3.7–5.3)
Sodium: 145 mEq/L (ref 137–147)
TOTAL PROTEIN: 7.6 g/dL (ref 6.0–8.3)
Total Bilirubin: 0.4 mg/dL (ref 0.3–1.2)

## 2014-02-19 LAB — CBG MONITORING, ED
Glucose-Capillary: 67 mg/dL — ABNORMAL LOW (ref 70–99)
Glucose-Capillary: 86 mg/dL (ref 70–99)

## 2014-02-19 LAB — URINE MICROSCOPIC-ADD ON

## 2014-02-19 MED ORDER — IOHEXOL 300 MG/ML  SOLN: 25.0000 mL | INTRAMUSCULAR | Status: AC

## 2014-02-19 MED ORDER — ONDANSETRON HCL 4 MG PO TABS: 4.0000 mg | ORAL_TABLET | Freq: Three times a day (TID) | ORAL | Status: DC | PRN

## 2014-02-19 MED ORDER — HYDROMORPHONE HCL PF 1 MG/ML IJ SOLN
1.0000 mg | INTRAMUSCULAR | Status: AC
  Administered 2014-02-19: 1 mg via INTRAVENOUS
  Filled 2014-02-19: qty 1

## 2014-02-19 MED ORDER — KCL IN DEXTROSE-NACL 20-5-0.45 MEQ/L-%-% IV SOLN: Freq: Once | INTRAVENOUS | Status: DC

## 2014-02-19 MED ORDER — CLINDAMYCIN HCL 300 MG PO CAPS: 300.0000 mg | ORAL_CAPSULE | Freq: Four times a day (QID) | ORAL | Status: DC

## 2014-02-19 MED ORDER — SODIUM CHLORIDE 0.9 % IV SOLN
3.0000 g | Freq: Once | INTRAVENOUS | Status: AC
  Administered 2014-02-19: 3 g via INTRAVENOUS
  Filled 2014-02-19: qty 3

## 2014-02-19 MED ORDER — IOHEXOL 300 MG/ML  SOLN
100.0000 mL | Freq: Once | INTRAMUSCULAR | Status: AC | PRN
  Administered 2014-02-19: 100 mL via INTRAVENOUS

## 2014-02-19 MED ORDER — ONDANSETRON HCL 4 MG/2ML IJ SOLN
4.0000 mg | Freq: Once | INTRAMUSCULAR | Status: AC
  Administered 2014-02-19: 4 mg via INTRAVENOUS
  Filled 2014-02-19: qty 2

## 2014-02-19 MED ORDER — LEVOFLOXACIN 750 MG PO TABS: 750.0000 mg | ORAL_TABLET | Freq: Every day | ORAL | Status: DC

## 2014-02-19 MED ORDER — OXYCODONE-ACETAMINOPHEN 5-325 MG PO TABS: 1.0000 | ORAL_TABLET | ORAL | Status: DC | PRN

## 2014-02-19 MED ORDER — SODIUM CHLORIDE 0.9 % IV BOLUS (SEPSIS)
1000.0000 mL | Freq: Once | INTRAVENOUS | Status: AC
  Administered 2014-02-19: 1000 mL via INTRAVENOUS

## 2014-02-19 NOTE — ED Provider Notes (Signed)
CSN: [id number]     Arrival date & time 02/19/14  3762 History   First MD Initiated Contact with Patient 02/19/14 0800     Chief Complaint  Patient presents with  . Flank Pain     (Consider location/radiation/quality/duration/timing/severity/associated sxs/prior Treatment) The history is provided by the patient and a relative.    Pt with hx diverticulitis presents with LLQ abdominal pain that began several weeks ago.  States she has frequent diverticulitis and her PCP's colleague called in Augmentin x 10 days for her then her PCP added 5 days.  She did not complete the course because she was feeling much better.  She was asymptomatic for approximately 1 week before her pain returned.  This was approximately 1-2 weeks ago.  The pain is located in her LLQ and radiates up under her left ribs.  Pain is intermittent, comes in waves and spasms, occasionally "pricky" and needle-like.  She either has 0/10 pain or 10/10 pain.  Is having abnormal soft stools, previously diarrhea, and is passing increased amounts of flatus.  Has had a "slow stream" of urine.  Has had chills, has chronic nausea.  Denies fevers, bloody stool, melena.  Denies dysuria, frequency, urgency, hematuria, vaginal bleeding or discharge.    Past Medical History  Diagnosis Date  . Brown recluse spider bite   . Lyme disease   . Diverticulitis   . Panic disorder   . Shingles   . Hyperlipidemia   . Migraines    Past Surgical History  Procedure Laterality Date  . Tonsillectomy    . Appendectomy    . Cholecystectomy     Family History  Problem Relation Age of Onset  . Diabetes Brother   . Diabetes Son   . Diabetes Mother   . Hypertension Mother   . Heart attack Mother   . Cancer Father     Melanoma    History  Substance Use Topics  . Smoking status: Never Smoker   . Smokeless tobacco: Not on file  . Alcohol Use: No   OB History   Grav Para Term Preterm Abortions TAB SAB Ect Mult Living                 Review  of Systems  Constitutional: Negative for fever.  Respiratory: Negative for cough and shortness of breath.   Cardiovascular: Negative for chest pain.  Gastrointestinal: Positive for nausea (chronic, unchanged x years), abdominal pain and diarrhea. Negative for vomiting, constipation and blood in stool.  Genitourinary: Negative for dysuria, urgency, frequency, vaginal bleeding and vaginal discharge.  All other systems reviewed and are negative.     Allergies  Ciprofloxacin; Codeine; Metronidazole; Morphine and related; and Other  Home Medications   Prior to Admission medications   Medication Sig Start Date End Date Taking? Authorizing Provider  acetaminophen (TYLENOL) 325 MG tablet Take 650 mg by mouth every 6 (six) hours as needed for pain.    Historical Provider, MD  alprazolam Duanne Moron) 2 MG tablet Take 1-2 mg by mouth every 6 (six) hours. Scheduled doses May take half to whole tablet    Historical Provider, MD  cholecalciferol (VITAMIN D) 1000 UNITS tablet Take 1,000 Units by mouth every morning. Two Tabs daily    Historical Provider, MD  docusate sodium 100 MG CAPS Take 100 mg by mouth daily. 07/16/13   Erline Hau, MD  hydrocortisone cream 1 % Apply 1 application topically as needed. For eczema    Historical Provider, MD  metoprolol  succinate (TOPROL XL) 25 MG 24 hr tablet Take 1 tablet (25 mg total) by mouth daily. 09/22/13   Lelon Perla, MD   BP 154/67  Pulse 65  Temp(Src) 97.7 F (36.5 C) (Oral)  Resp 17  Wt 174 lb (78.926 kg)  SpO2 99% Physical Exam  Nursing note and vitals reviewed. Constitutional: She appears well-developed and well-nourished. No distress.  HENT:  Head: Normocephalic and atraumatic.  Eyes: Conjunctivae are normal.  Neck: Neck supple.  Cardiovascular: Normal rate and regular rhythm.   Pulmonary/Chest: Effort normal and breath sounds normal. No respiratory distress. She has no wheezes. She has no rales.  Abdominal: Soft. She exhibits  no distension and no mass. There is tenderness in the left lower quadrant. There is no rebound, no guarding and no CVA tenderness.  Diffuse tenderness, worst in LLQ  Neurological: She is alert.  Skin: She is not diaphoretic.    ED Course  Procedures (including critical care time) Labs Review Labs Reviewed  COMPREHENSIVE METABOLIC PANEL - Abnormal; Notable for the following:    GFR calc non Af Amer 75 (*)    GFR calc Af Amer 86 (*)    All other components within normal limits  URINALYSIS, ROUTINE W REFLEX MICROSCOPIC - Abnormal; Notable for the following:    Leukocytes, UA TRACE (*)    All other components within normal limits  CBG MONITORING, ED - Abnormal; Notable for the following:    Glucose-Capillary 67 (*)    All other components within normal limits  CBC WITH DIFFERENTIAL  URINE MICROSCOPIC-ADD ON  CBG MONITORING, ED    Imaging Review Ct Abdomen Pelvis W Contrast  02/19/2014   CLINICAL DATA:  Left-sided flank pain  EXAM: CT ABDOMEN AND PELVIS WITH CONTRAST  TECHNIQUE: Multidetector CT imaging of the abdomen and pelvis was performed using the standard protocol following bolus administration of intravenous contrast.  CONTRAST:  11mL OMNIPAQUE IOHEXOL 300 MG/ML  SOLN  COMPARISON:  07/14/2012  FINDINGS: There is chronic scarring at the left lung base. No pleural or pericardial fluid.  There has been previous cholecystectomy. The liver parenchyma is normal, with mild prominence of the biliary tract, typical falling cholecystectomy. The spleen is normal. The pancreas is normal. The adrenal glands are normal. The kidneys are normal. No cysts, mass, stone or hydronephrosis. There is atherosclerosis of the aorta but no aneurysm. The IVC is normal. No retroperitoneal mass or adenopathy.  There is diverticulosis of the left colon. There is mild stranding in the fat surrounding the proximal sigmoid colon. Previously, the patient had pronounced diverticulitis in this region. What we are seeing  today could be scarring from the previous episode or could reflect early active diverticulitis. The bladder is normal. Uterus and adnexal regions are normal. No significant bony finding.  IMPRESSION: No urinary tract stone disease.  Diverticulosis of the left colon. Some stranding in the proximal sigmoid region surrounding the colon. Though this could represent scarring from a previous episode of diverticulitis, given the presence of left-sided symptoms, the this probably represents early active diverticulitis. No abscess or free air.   Electronically Signed   By: Nelson Chimes M.D.   On: 02/19/2014 11:24     EKG Interpretation None      Discussed pt with Dr Ashok Cordia who has also seen the patient.    12:27 PM Discussed results and plan with Dr Ashok Cordia.   12:49 PM Spoke with Dr Woody Seller, patient's primary care provider, who will follow patient.   MDM  Final diagnoses:  Diverticulitis   Afebrile nontoxic patient with recurrent diverticulitis without complication.  She has just completed a course of Augmentin in the past few weeks.  Discussed pt with her PCP who agrees to follow patient and says to use Levaquin for patient (pt does not tolerate cipro but tolerates levaquin).  Discussed with Dr Ashok Cordia who recommends adding clindamycin for more thorough coverage.  Pt d/c home with these antibiotics, percocet, and zofran.  Pt is tolerating PO.  WBC is normal.  Labs unremarkable.  UA negative for infection.  Discussed result, findings, treatment, and follow up  with patient.  Pt given return precautions.  Pt verbalizes understanding and agrees with plan.        Clayton Bibles, PA-C 02/19/14 1520

## 2014-02-19 NOTE — ED Notes (Signed)
Pt reports left sided flank pain that started before Easter. States that she was placed on Augmentin and has not been feeling much better. States that she was seen by her PCP and given another Rx for Augmentin. Reports some nausea, states that she is only urinating in small amounts. Also states her abd feels swollen.

## 2014-02-19 NOTE — ED Notes (Signed)
Provider at the bedside. Pt placed in gown and on the monitor

## 2014-02-19 NOTE — Discharge Instructions (Signed)
Read the information below.  Use the prescribed medication as directed.  Please discuss all new medications with your pharmacist.  Do not take additional tylenol while taking the prescribed pain medication to avoid overdose.  You may return to the Emergency Department at any time for worsening condition or any new symptoms that concern you.  If you develop high fevers, worsening abdominal pain, uncontrolled vomiting, or are unable to tolerate fluids by mouth, return to the ER for a recheck.     Diverticulitis A diverticulum is a small pouch or sac on the colon. Diverticulosis is the presence of these diverticula on the colon. Diverticulitis is the irritation (inflammation) or infection of diverticula. CAUSES  The colon and its diverticula contain bacteria. If food particles block the tiny opening to a diverticulum, the bacteria inside can grow and cause an increase in pressure. This leads to infection and inflammation and is called diverticulitis. SYMPTOMS   Abdominal pain and tenderness. Usually, the pain is located on the left side of your abdomen. However, it could be located elsewhere.  Fever.  Bloating.  Feeling sick to your stomach (nausea).  Throwing up (vomiting).  Abnormal stools. DIAGNOSIS  Your caregiver will take a history and perform a physical exam. Since many things can cause abdominal pain, other tests may be necessary. Tests may include:  Blood tests.  Urine tests.  X-ray of the abdomen.  CT scan of the abdomen. Sometimes, surgery is needed to determine if diverticulitis or other conditions are causing your symptoms. TREATMENT  Most of the time, you can be treated without surgery. Treatment includes:  Resting the bowels by only having liquids for a few days. As you improve, you will need to eat a low-fiber diet.  Intravenous (IV) fluids if you are losing body fluids (dehydrated).  Antibiotic medicines that treat infections may be given.  Pain and nausea  medicine, if needed.  Surgery if the inflamed diverticulum has burst. HOME CARE INSTRUCTIONS   Try a clear liquid diet (broth, tea, or water for as long as directed by your caregiver). You may then gradually begin a low-fiber diet as tolerated.  A low-fiber diet is a diet with less than 10 grams of fiber. Choose the foods below to reduce fiber in the diet:  White breads, cereals, rice, and pasta.  Cooked fruits and vegetables or soft fresh fruits and vegetables without the skin.  Ground or well-cooked tender beef, ham, veal, lamb, pork, or poultry.  Eggs and seafood.  After your diverticulitis symptoms have improved, your caregiver may put you on a high-fiber diet. A high-fiber diet includes 14 grams of fiber for every 1000 calories consumed. For a standard 2000 calorie diet, you would need 28 grams of fiber. Follow these diet guidelines to help you increase the fiber in your diet. It is important to slowly increase the amount fiber in your diet to avoid gas, constipation, and bloating.  Choose whole-grain breads, cereals, pasta, and brown rice.  Choose fresh fruits and vegetables with the skin on. Do not overcook vegetables because the more vegetables are cooked, the more fiber is lost.  Choose more nuts, seeds, legumes, dried peas, beans, and lentils.  Look for food products that have greater than 3 grams of fiber per serving on the Nutrition Facts label.  Take all medicine as directed by your caregiver.  If your caregiver has given you a follow-up appointment, it is very important that you go. Not going could result in lasting (chronic) or permanent injury,  pain, and disability. If there is any problem keeping the appointment, call to reschedule. SEEK MEDICAL CARE IF:   Your pain does not improve.  You have a hard time advancing your diet beyond clear liquids.  Your bowel movements do not return to normal. SEEK IMMEDIATE MEDICAL CARE IF:   Your pain becomes worse.  You have  an oral temperature above 102 F (38.9 C), not controlled by medicine.  You have repeated vomiting.  You have bloody or black, tarry stools.  Symptoms that brought you to your caregiver become worse or are not getting better. MAKE SURE YOU:   Understand these instructions.  Will watch your condition.  Will get help right away if you are not doing well or get worse. Document Released: 07/29/2005 Document Revised: 01/11/2012 Document Reviewed: 11/24/2010 [facility] Patient Information 2014 [place].

## 2014-02-19 NOTE — ED Notes (Signed)
Called CT to inform that pt is done with contrast.

## 2014-02-19 NOTE — ED Notes (Signed)
Called Pharmacy to get antibiotics. No medication currently in the pyxis.

## 2014-02-19 NOTE — ED Notes (Signed)
Talked with provider about pt's CBG. Started patient on D5 1/2NS.

## 2014-02-20 ENCOUNTER — Ambulatory Visit: Payer: Medicare Other | Admitting: Cardiology

## 2014-02-20 NOTE — ED Provider Notes (Signed)
Medical screening examination/treatment/procedure(s) were conducted as a shared visit with non-physician practitioner(s) and myself.  I personally evaluated the patient during the encounter.   EKG Interpretation None      Pt with hx diverticulitis c/o llq pain similar to prior diverticulitis. Afeb. llq tenderness, no rebound or guarding. Afeb, wbc normal. Ct c/w mild diverticulitis. Iv abx, po abx for home.  pcp called to facilitate close f/u.   Mirna Mires, MD 02/20/14 [phone]

## 2014-03-14 ENCOUNTER — Telehealth: Payer: Self-pay | Admitting: Cardiology

## 2014-03-14 NOTE — Telephone Encounter (Signed)
New message          Pt would like to come into see dr Stanford Breed soon / pt is still having swelling / can pt be worked in?

## 2014-03-14 NOTE — Telephone Encounter (Signed)
Left message for pt son to call  

## 2014-03-21 NOTE — Telephone Encounter (Signed)
Left message for pt son to call  

## 2014-04-05 NOTE — Telephone Encounter (Signed)
Unable to reach pt son or leave a message  Mailbox is full

## 2014-06-18 ENCOUNTER — Ambulatory Visit: Payer: Medicare Other | Admitting: Cardiology

## 2014-06-26 ENCOUNTER — Ambulatory Visit (INDEPENDENT_AMBULATORY_CARE_PROVIDER_SITE_OTHER): Payer: Medicare Other | Admitting: Cardiology

## 2014-06-26 ENCOUNTER — Encounter: Payer: Self-pay | Admitting: Cardiology

## 2014-06-26 VITALS — BP 124/80 | HR 68 | Ht 66.0 in | Wt 178.0 lb

## 2014-06-26 DIAGNOSIS — R002 Palpitations: Secondary | ICD-10-CM

## 2014-06-26 NOTE — Progress Notes (Signed)
      HPI: FU palpitations and chest pain. Echocardiogram in September of 2014 showed normal LV function. There was grade 1 diastolic dysfunction. Trace mitral regurgitation. Patient has had intermittent palpitations for years. Exercise treadmill in November of 2014 showed poor exercise tolerance. There was no chest pain, no ECG changes and no arrhythmias. Monitor in November of 2014 showed sinus rhythm with PACs and PVCs. Since I last saw her, she denies dyspnea or recurrent chest pain. No syncope. Palpitations are much improved with Toprol.   Current Outpatient Prescriptions  Medication Sig Dispense Refill  . alprazolam (XANAX) 2 MG tablet Take 1-2 mg by mouth every 6 (six) hours. Scheduled doses May take half to whole tablet      . cholecalciferol (VITAMIN D) 1000 UNITS tablet Take 1,000 Units by mouth every morning. Two Tabs daily      . docusate sodium 100 MG CAPS Take 100 mg by mouth daily.  10 capsule  0  . metoprolol succinate (TOPROL XL) 25 MG 24 hr tablet Take 1 tablet (25 mg total) by mouth daily.  30 tablet  12  . ondansetron (ZOFRAN) 4 MG tablet Take 1 tablet (4 mg total) by mouth every 8 (eight) hours as needed for nausea or vomiting.  15 tablet  0   No current facility-administered medications for this visit.     Past Medical History  Diagnosis Date  . Brown recluse spider bite   . Lyme disease   . Diverticulitis   . Panic disorder   . Shingles   . Hyperlipidemia   . Migraines     Past Surgical History  Procedure Laterality Date  . Tonsillectomy    . Appendectomy    . Cholecystectomy      History   Social History  . Marital Status: Widowed    Spouse Name: N/A    Number of Children: 45  . Years of Education: N/A   Occupational History  . Not on file.   Social History Main Topics  . Smoking status: Never Smoker   . Smokeless tobacco: Not on file  . Alcohol Use: No  . Drug Use: No  . Sexual Activity: Not on file   Other Topics Concern  . Not on file    Social History Narrative  . No narrative on file    ROS: no fevers or chills, productive cough, hemoptysis, dysphasia, odynophagia, melena, hematochezia, dysuria, hematuria, rash, seizure activity, orthopnea, PND, pedal edema, claudication. Remaining systems are negative.  Physical Exam: Well-developed well-nourished in no acute distress.  Skin is warm and dry.  HEENT is normal.  Neck is supple.  Chest is clear to auscultation with normal expansion.  Cardiovascular exam is regular rate and rhythm.  Abdominal exam nontender or distended. No masses palpated. Extremities show no edema. neuro grossly intact  ECG NSR, no ST changes

## 2014-06-26 NOTE — Assessment & Plan Note (Signed)
Management per primary care. 

## 2014-06-26 NOTE — Assessment & Plan Note (Signed)
Symptoms are much improved with Toprol. Continue.

## 2014-06-26 NOTE — Patient Instructions (Signed)
Your physician wants you to follow-up in: ONE YEAR WITH DR CRENSHAW You will receive a reminder letter in the mail two months in advance. If you don't receive a letter, please call our office to schedule the follow-up appointment.  

## 2014-08-09 ENCOUNTER — Telehealth: Payer: Self-pay | Admitting: General Practice

## 2014-08-09 NOTE — Telephone Encounter (Signed)
Patient called and ask if we would fax her pcp a copy of her last tcs report.  I faxed a copy to Otila Kluver, nurse for Dr. Woody Seller at the patient's request.

## 2014-08-09 NOTE — Telephone Encounter (Signed)
I also went through all the dates she had tcs in the past as well as her upcoming tcs in March of 2017.  She wanted to write all of the dates down for her records

## 2014-09-03 ENCOUNTER — Other Ambulatory Visit: Payer: Self-pay | Admitting: Cardiology

## 2015-04-03 ENCOUNTER — Telehealth: Payer: Self-pay

## 2015-04-03 NOTE — Telephone Encounter (Signed)
Pt is has not had a bowel movement in 4 days. She has taken miralax but nothing is helping. She is worried. She is not eating because she has no appetite. She is not in any pain but she said that if she needs to go to there ER she will. Her call back number is [phone]. Please advise

## 2015-04-03 NOTE — Telephone Encounter (Signed)
May take miralax 17 grams orally q 1 hour x 4 doses until BM; then once to twice daily as needed

## 2015-04-04 ENCOUNTER — Emergency Department (HOSPITAL_COMMUNITY)
Admission: EM | Admit: 2015-04-04 | Discharge: 2015-04-04 | Disposition: A | Discharge status: Home / Self Care | Payer: Medicare Other | Attending: Emergency Medicine | Admitting: Emergency Medicine

## 2015-04-04 ENCOUNTER — Encounter (HOSPITAL_COMMUNITY): Payer: Self-pay

## 2015-04-04 ENCOUNTER — Emergency Department (HOSPITAL_COMMUNITY): Payer: Medicare Other

## 2015-04-04 DIAGNOSIS — K5900 Constipation, unspecified: Secondary | ICD-10-CM | POA: Diagnosis present

## 2015-04-04 DIAGNOSIS — Z9049 Acquired absence of other specified parts of digestive tract: Secondary | ICD-10-CM | POA: Insufficient documentation

## 2015-04-04 DIAGNOSIS — Z8719 Personal history of other diseases of the digestive system: Secondary | ICD-10-CM | POA: Diagnosis not present

## 2015-04-04 DIAGNOSIS — R1032 Left lower quadrant pain: Secondary | ICD-10-CM | POA: Insufficient documentation

## 2015-04-04 DIAGNOSIS — Z8639 Personal history of other endocrine, nutritional and metabolic disease: Secondary | ICD-10-CM | POA: Diagnosis not present

## 2015-04-04 DIAGNOSIS — Z8619 Personal history of other infectious and parasitic diseases: Secondary | ICD-10-CM | POA: Insufficient documentation

## 2015-04-04 DIAGNOSIS — R109 Unspecified abdominal pain: Secondary | ICD-10-CM

## 2015-04-04 DIAGNOSIS — F410 Panic disorder [episodic paroxysmal anxiety] without agoraphobia: Secondary | ICD-10-CM | POA: Diagnosis not present

## 2015-04-04 DIAGNOSIS — Z8669 Personal history of other diseases of the nervous system and sense organs: Secondary | ICD-10-CM | POA: Insufficient documentation

## 2015-04-04 MED ORDER — MAGNESIUM CITRATE PO SOLN
0.5000 | Freq: Once | ORAL | Status: AC
  Administered 2015-04-04: 0.5 via ORAL
  Filled 2015-04-04: qty 296

## 2015-04-04 NOTE — Telephone Encounter (Signed)
Spoke with pt and she is aware of RMR recommendations

## 2015-04-04 NOTE — ED Provider Notes (Signed)
CSN: [id number]     Arrival date & time 04/04/15  1550 History  This chart was scribed for Alexa Ferguson, MD by Randa Evens, ED Scribe. This patient was seen in room APA09/APA09 and the patient's care was started at 8:08 PM.     Chief Complaint  Patient presents with  . Fecal Impaction   Patient is a 69 y.o. female presenting with constipation. The history is provided by the patient. No language interpreter was used.  Constipation Severity:  Moderate Time since last bowel movement:  5 days Progression:  Unchanged Chronicity:  Recurrent Stool description:  None produced Relieved by:  Nothing Worsened by:  Nothing tried Ineffective treatments:  Miralax Associated symptoms: abdominal pain   Associated symptoms: no back pain, no diarrhea, no nausea and no vomiting    HPI Comments: Alexa Wilson is a 69 y.o. female with PMHX diverticulit who presents to the Emergency Department complaining of recurrent constipation onset 5 days prior. Pt is complaining LLQ abdominal pain. Pt states she has tried miralax with no relief. Pt denies nausea or vomiting.   Past Medical History  Diagnosis Date  . Brown recluse spider bite   . Lyme disease   . Diverticulitis   . Panic disorder   . Shingles   . Hyperlipidemia   . Migraines    Past Surgical History  Procedure Laterality Date  . Tonsillectomy    . Appendectomy    . Cholecystectomy     Family History  Problem Relation Age of Onset  . Diabetes Brother   . Diabetes Son   . Diabetes Mother   . Hypertension Mother   . Heart attack Mother   . Cancer Father     Melanoma    History  Substance Use Topics  . Smoking status: Never Smoker   . Smokeless tobacco: Not on file  . Alcohol Use: No   OB History    No data available     Review of Systems  Constitutional: Negative for appetite change and fatigue.  HENT: Negative for congestion, ear discharge and sinus pressure.   Eyes: Negative for discharge.  Respiratory: Negative  for cough.   Cardiovascular: Negative for chest pain.  Gastrointestinal: Positive for abdominal pain and constipation. Negative for nausea, vomiting and diarrhea.  Genitourinary: Negative for frequency and hematuria.  Musculoskeletal: Negative for back pain.  Skin: Negative for rash.  Neurological: Negative for seizures and headaches.  Psychiatric/Behavioral: Negative for hallucinations.      Allergies  Ciprofloxacin; Codeine; Metronidazole; Morphine and related; and Other  Home Medications   Prior to Admission medications   Medication Sig Start Date End Date Taking? Authorizing Provider  alprazolam Duanne Moron) 2 MG tablet Take 1 mg by mouth every 6 (six) hours. Scheduled doses May take half tablet   Yes Historical Provider, MD  cholecalciferol (VITAMIN D) 1000 UNITS tablet Take 2,000 Units by mouth every morning.    Yes Historical Provider, MD  glycerin adult 2 G SUPP Place 1 suppository rectally once.   Yes Historical Provider, MD  metoprolol succinate (TOPROL-XL) 25 MG 24 hr tablet TAKE 1 TABLET BY MOUTH DAILY 09/04/14  Yes Lelon Perla, MD  polyethylene glycol powder (GLYCOLAX/MIRALAX) powder Take 17 g by mouth daily.   Yes Historical Provider, MD  docusate sodium 100 MG CAPS Take 100 mg by mouth daily. Patient not taking: Reported on 04/04/2015 07/16/13   Erline Hau, MD  ondansetron (ZOFRAN) 4 MG tablet Take 1 tablet (4 mg total) by  mouth every 8 (eight) hours as needed for nausea or vomiting. Patient not taking: Reported on 04/04/2015 02/19/14   Clayton Bibles, PA-C   BP 118/65 mmHg  Pulse 73  Temp(Src) 97.9 F (36.6 C) (Oral)  Resp 16  Ht 5\' 6"  (1.676 m)  Wt 180 lb (81.647 kg)  BMI 29.07 kg/m2  SpO2 100%   Physical Exam  Constitutional: She is oriented to person, place, and time. She appears well-developed.  HENT:  Head: Normocephalic.  Eyes: Conjunctivae and EOM are normal. No scleral icterus.  Neck: Neck supple. No thyromegaly present.  Cardiovascular:  Normal rate and regular rhythm.  Exam reveals no gallop and no friction rub.   No murmur heard. Pulmonary/Chest: No stridor. She has no wheezes. She has no rales. She exhibits no tenderness.  Abdominal: She exhibits no distension. There is tenderness in the left lower quadrant. There is no rebound.  Genitourinary:  No stool in vault  O/w nl exam  Musculoskeletal: Normal range of motion. She exhibits no edema.  Lymphadenopathy:    She has no cervical adenopathy.  Neurological: She is oriented to person, place, and time. She exhibits normal muscle tone. Coordination normal.  Skin: No rash noted. No erythema.  Psychiatric: She has a normal mood and affect. Her behavior is normal.  Nursing note and vitals reviewed.   ED Course  Procedures (including critical care time) DIAGNOSTIC STUDIES: Oxygen Saturation is 100% on RA, normal by my interpretation.    COORDINATION OF CARE: 8:19 PM-Discussed treatment plan with pt at bedside and pt agreed to plan.     Labs Review Labs Reviewed - No data to display  Imaging Review Dg Abd Acute W/chest  04/04/2015   CLINICAL DATA:  Acute onset of recurrent constipation and left lower quadrant abdominal pain. Initial encounter.  EXAM: DG ABDOMEN ACUTE W/ 1V CHEST  COMPARISON:  Chest radiograph performed 07/15/2013, and CT of the abdomen and pelvis performed 02/19/2014  FINDINGS: The lungs are well-aerated. Minimal left basilar atelectasis is noted. There is no evidence of pleural effusion or pneumothorax. The cardiomediastinal silhouette is within normal limits.  The visualized bowel gas pattern is unremarkable. Scattered stool and air are seen within the colon; there is no evidence of small bowel dilatation to suggest obstruction. No free intra-abdominal air is identified on the provided upright view.  No acute osseous abnormalities are seen; the sacroiliac joints are unremarkable in appearance.  IMPRESSION: 1. Unremarkable bowel gas pattern; no free  intra-abdominal air seen. Small to moderate amount of stool noted in the colon. 2. Minimal left basilar atelectasis noted.  Lungs otherwise clear.   Electronically Signed   By: Garald Balding M.D.   On: 04/04/2015 21:08     EKG Interpretation None      MDM   Final diagnoses:  Abdominal pain      The chart was scribed for me under my direct supervision.  I personally performed the history, physical, and medical decision making and all procedures in the evaluation of this patient.Alexa Ferguson, MD 04/04/15 2225

## 2015-04-04 NOTE — ED Notes (Signed)
Pt reports 2 1/2 weeks ago had a stomach virus with n/v/d.  Reports no BM x 5 days.  Pt has been taking miralax with no results.  Pt c/o rectal pain and has had some rectal bleeding after straining.

## 2015-04-04 NOTE — Discharge Instructions (Signed)
Drink another 1/2 of a bottle of mg citrate tomorrow if you do not have a bowel movement.  Follow up with dr. Sydell Axon as needed

## 2015-04-04 NOTE — Telephone Encounter (Signed)
Pt called at 12:30 and left a voicemail stating she has not had a bm yet. I called and spoke with the pt and informed her that it will take some time after taking the miralax for her to start having bm's. Pt said she thought she would start immediately having bm's, she wants to know if she should go to the ED or not, she is worried. Advised her to drink plenty of fluids and if she didn't have a bm today and she started having severe pain, N/V etc, she should go to the ED.   Pt called again before I could type this note, she wanted to know if she could drink a bottle of mag citrate. I spoke with EG, he did not recommend that she take it after taking all the miralax. He agreed it would take some time for her to start having a BM. Pt verbalized understanding.

## 2015-04-04 NOTE — Telephone Encounter (Signed)
I agree w advice given so long as pt does not have n/ v or severe abd pain

## 2015-04-08 ENCOUNTER — Telehealth: Payer: Self-pay | Admitting: Internal Medicine

## 2015-04-08 NOTE — Telephone Encounter (Signed)
Pt called and states that she did have a bowel movement yesterday. Pt states that she is having lower back pain, rectal pain, and feel tender in her abdominal area.

## 2015-04-08 NOTE — Telephone Encounter (Signed)
Pt LMOM after hours saying that she was returning a call that she needed an OV this week and didn't know when she would be able to come in. She said that she would be calling back first thing in the morning to find out what she needed to do.

## 2015-04-08 NOTE — Telephone Encounter (Signed)
Patient has too many symptoms to deal with via telephone. She needs an expedited office visit with extender this week.

## 2015-04-08 NOTE — Telephone Encounter (Signed)
Called pt to schedule an office visit. LMOM to call office back

## 2015-04-09 ENCOUNTER — Ambulatory Visit (INDEPENDENT_AMBULATORY_CARE_PROVIDER_SITE_OTHER): Payer: Medicare Other | Admitting: Nurse Practitioner

## 2015-04-09 VITALS — BP 126/74 | HR 76 | Temp 97.0°F | Ht 66.0 in | Wt 178.0 lb

## 2015-04-09 DIAGNOSIS — K5900 Constipation, unspecified: Secondary | ICD-10-CM | POA: Insufficient documentation

## 2015-04-09 NOTE — Progress Notes (Signed)
CC'ED TO PCP 

## 2015-04-09 NOTE — Assessment & Plan Note (Signed)
Patient is status post an acute bout of constipation and possible fecal impaction. Was seen in the ER 2 with no relief. The patient called our office received device on MiraLAX dosing for severe constipation and subsequently had a bowel movement. She has some residual abdominal tenderness and low back pain which is improving daily. She does not have a daily issue with constipation, has just had a couple incidents of acute scenarios like these. Currently only takes a daily stool softener and adequate water. We'll have her add MiraLAX 17 g daily to help promote regular bowel movements and prevent another acute exacerbation of severe constipation. Return as needed for further symptoms.

## 2015-04-09 NOTE — Progress Notes (Signed)
Referring Provider: Glenda Chroman., MD Primary Care Physician:  Glenda Chroman., MD Primary GI: Dr. Gala Romney  Chief Complaint  Patient presents with  . Abdominal Pain  . Constipation    HPI:   69 year old female presents complaining of abdominal pain, constipation. She called our office last Friday complaining of no bowel movement in 4 days. She is given extensive advice and eventually did have a bowel movement. However after a bowel movement she complained of continued abdominal tenderness, lower back pain, and rectal pain. Was advised to schedule an appointment for this week to do with her symptoms. Last colonoscopy completed 01/19/2011 which showed external hemorrhoids, left-sided diverticula, polypectomy which was shown to be benign on pathology. Recommended increased fiber, align probiotic, Anusol suppositories as needed, repeat colonoscopy in 5 years (March 2017).  Today she states she went to [facility] and [facility] ER and was given a bottle of magnesium citrate which did not help. Sunday morning she had a large bowel movement. Her back pain and abdominal tenderness is improved but continue to hurt some. Has a long history of constipation but she is unable to quantify how long. Was having hematochezia with straining while trying to have a bowel movement. Typically has a bowel movement about daily and typically passes well without much straining. States she normally doesn't have a lot of constipation problems. Has had previous acute issue before but doesn't remember when. Denies any other upper or lower GI symptoms. Takes stool softener daily and adequate water. Does not normally take Miralax every day. Denies any other upper or lower GI symptoms.  Past Medical History  Diagnosis Date  . Brown recluse spider bite   . Lyme disease   . Diverticulitis   . Panic disorder   . Shingles   . Hyperlipidemia   . Migraines     Past Surgical History  Procedure Laterality Date  . Tonsillectomy      . Appendectomy    . Cholecystectomy    . Colonoscopy  2012    RMR: 1. External hemorrhoids, otherwise normal rectum. 2. Left-sided diverticula status post snare polypectomy left colon polyp. Remainder of the colonic mucosa and terminal ileum mucoa appeared normal.     Current Outpatient Prescriptions  Medication Sig Dispense Refill  . alprazolam (XANAX) 2 MG tablet Take 1 mg by mouth every 6 (six) hours. Scheduled doses May take half tablet    . cholecalciferol (VITAMIN D) 1000 UNITS tablet Take 2,000 Units by mouth every morning.     . docusate sodium 100 MG CAPS Take 100 mg by mouth daily. 10 capsule 0  . glycerin adult 2 G SUPP Place 1 suppository rectally once.    . metoprolol succinate (TOPROL-XL) 25 MG 24 hr tablet TAKE 1 TABLET BY MOUTH DAILY 30 tablet 11  . ondansetron (ZOFRAN) 4 MG tablet Take 1 tablet (4 mg total) by mouth every 8 (eight) hours as needed for nausea or vomiting. 15 tablet 0  . polyethylene glycol powder (GLYCOLAX/MIRALAX) powder Take 17 g by mouth daily.     No current facility-administered medications for this visit.    Allergies as of 04/09/2015 - Review Complete 04/09/2015  Allergen Reaction Noted  . Ciprofloxacin Nausea Only   . Codeine Other (See Comments)   . Metronidazole Nausea Only   . Morphine and related Nausea And Vomiting 05/11/2013  . Other Other (See Comments) 07/14/2012    Family History  Problem Relation Age of Onset  . Diabetes Brother   .  Diabetes Son   . Diabetes Mother   . Hypertension Mother   . Heart attack Mother   . Cancer Father     Melanoma     History   Social History  . Marital Status: Widowed    Spouse Name: N/A  . Number of Children: 5  . Years of Education: N/A   Social History Main Topics  . Smoking status: Never Smoker   . Smokeless tobacco: Not on file  . Alcohol Use: No  . Drug Use: No  . Sexual Activity: Not on file   Other Topics Concern  . None   Social History Narrative    Review of  Systems: General: Negative for anorexia, weight loss, fever, chills, fatigue, weakness. CV: Negative for chest pain, angina, palpitations, dyspnea on exertion, peripheral edema.  Respiratory: Negative for dyspnea at rest, dyspnea on exertion, cough, sputum, wheezing.  GI: See history of present illness. MS: Admits improving low back pain.  Endo: Negative for unusual weight change.  Heme: Negative for bruising or bleeding.   Physical Exam: BP 126/74 mmHg  Pulse 76  Temp(Src) 97 F (36.1 C)  Ht 5\' 6"  (1.676 m)  Wt 178 lb (80.74 kg)  BMI 28.74 kg/m2 General:   Alert and oriented. Pleasant and cooperative. Well-nourished and well-developed.  Head:  Normocephalic and atraumatic. Ears:  Normal auditory acuity. Cardiovascular:  S1, S2 present without murmurs appreciated. Normal pulses noted. Extremities without clubbing or edema. Respiratory:  Clear to auscultation bilaterally. No wheezes, rales, or rhonchi. No distress.  Gastrointestinal:  +BS, soft, and non-distended. No TTP anywhere in the abdomen. No HSM noted. No guarding or rebound. No masses appreciated.  Rectal:  Deferred  Neurologic:  Alert and oriented x4;  grossly normal neurologically. Psych:  Alert and cooperative. Normal mood and affect. Heme/Lymph/Immune: No excessive bruising noted.    04/09/2015 10:14 AM

## 2015-04-09 NOTE — Telephone Encounter (Signed)
Pt had office visit today.

## 2015-04-09 NOTE — Patient Instructions (Signed)
1. Add MiraLAX 17 g (1 capful) every day. 2. Continue taking Colace stool softener. 3. Return as needed for any further symptoms. 4. We will send you a letter when it is about time for your repeat colonoscopy.

## 2015-04-09 NOTE — Telephone Encounter (Signed)
Pt has ov today 

## 2015-08-29 ENCOUNTER — Telehealth: Payer: Self-pay | Admitting: Cardiology

## 2015-08-29 NOTE — Telephone Encounter (Signed)
Returned call to patient.She stated she was doing good needed to schedule appointment with Dr.Crenshaw.Appointment scheduled with Dr.Crenshaw 10/04/15 at 3:30 pm.

## 2015-08-29 NOTE — Telephone Encounter (Signed)
°  Alexa Wilson is calling to find out if she should or when she is to come back to see Dr.Crenshaw , please  Call  Thanks

## 2015-09-24 NOTE — Progress Notes (Signed)
      HPI: FU palpitations and chest pain. Echocardiogram in September of 2014 showed normal LV function. There was grade 1 diastolic dysfunction. Trace mitral regurgitation. Monitor November 2014 showed sinus with PACs and PVCs. Exercise treadmill November 2014 normal. Since last seen,   Current Outpatient Prescriptions  Medication Sig Dispense Refill  . alprazolam (XANAX) 2 MG tablet Take 1 mg by mouth every 6 (six) hours. Scheduled doses May take half tablet    . cholecalciferol (VITAMIN D) 1000 UNITS tablet Take 2,000 Units by mouth every morning.     . docusate sodium 100 MG CAPS Take 100 mg by mouth daily. 10 capsule 0  . glycerin adult 2 G SUPP Place 1 suppository rectally once.    . metoprolol succinate (TOPROL-XL) 25 MG 24 hr tablet TAKE 1 TABLET BY MOUTH DAILY 30 tablet 11  . ondansetron (ZOFRAN) 4 MG tablet Take 1 tablet (4 mg total) by mouth every 8 (eight) hours as needed for nausea or vomiting. 15 tablet 0  . polyethylene glycol powder (GLYCOLAX/MIRALAX) powder Take 17 g by mouth daily.     No current facility-administered medications for this visit.     Past Medical History  Diagnosis Date  . Brown recluse spider bite   . Lyme disease   . Diverticulitis   . Panic disorder   . Shingles   . Hyperlipidemia   . Migraines   . Constipation     Past Surgical History  Procedure Laterality Date  . Tonsillectomy    . Appendectomy    . Cholecystectomy    . Colonoscopy  2012    RMR: 1. External hemorrhoids, otherwise normal rectum. 2. Left-sided diverticula status post snare polypectomy left colon polyp. Remainder of the colonic mucosa and terminal ileum mucoa appeared normal.     Social History   Social History  . Marital Status: Widowed    Spouse Name: N/A  . Number of Children: 5  . Years of Education: N/A   Occupational History  . Not on file.   Social History Main Topics  . Smoking status: Never Smoker   . Smokeless tobacco: Never Used  . Alcohol Use:  No  . Drug Use: No  . Sexual Activity: Not on file   Other Topics Concern  . Not on file   Social History Narrative    ROS: no fevers or chills, productive cough, hemoptysis, dysphasia, odynophagia, melena, hematochezia, dysuria, hematuria, rash, seizure activity, orthopnea, PND, pedal edema, claudication. Remaining systems are negative.  Physical Exam: Well-developed well-nourished in no acute distress.  Skin is warm and dry.  HEENT is normal.  Neck is supple.  Chest is clear to auscultation with normal expansion.  Cardiovascular exam is regular rate and rhythm.  Abdominal exam nontender or distended. No masses palpated. Extremities show no edema. neuro grossly intact  ECG     This encounter was created in error - please disregard.

## 2015-10-04 ENCOUNTER — Encounter: Payer: Medicare Other | Admitting: Cardiology

## 2015-10-23 ENCOUNTER — Other Ambulatory Visit: Payer: Self-pay | Admitting: Cardiology

## 2015-10-23 NOTE — Telephone Encounter (Signed)
Lelon Perla, MD at 06/26/2014 6:12 AM  metoprolol succinate (TOPROL XL) 25 MG 24 hr tabletTake 1 tablet (25 mg total) by mouth daily Palpitations - Lelon Perla, MD at 06/26/2014 2:44 PM     Status: Written Related Problem: Palpitations   Expand All Collapse All   Symptoms are much improved with Toprol. Continue

## 2015-11-10 NOTE — Progress Notes (Signed)
      HPI: FU palpitations and chest pain. Echocardiogram in September of 2014 showed normal LV function. There was grade 1 diastolic dysfunction. Trace mitral regurgitation. Patient has had intermittent palpitations for years. Exercise treadmill in November of 2014 showed poor exercise tolerance. There was no chest pain, no ECG changes and no arrhythmias. Monitor in November of 2014 showed sinus rhythm with PACs and PVCs. Since I last saw her, There is no dyspnea, palpitations or syncope. No exertional chest pain.  Current Outpatient Prescriptions  Medication Sig Dispense Refill  . alprazolam (XANAX) 2 MG tablet Take 1 mg by mouth every 6 (six) hours. Scheduled doses May take half tablet    . cholecalciferol (VITAMIN D) 1000 UNITS tablet Take 2,000 Units by mouth every morning.     . docusate sodium 100 MG CAPS Take 100 mg by mouth daily. 10 capsule 0  . glycerin adult 2 G SUPP Place 1 suppository rectally once.    . metoprolol succinate (TOPROL-XL) 25 MG 24 hr tablet TAKE 1 TABLET BY MOUTH DAILY 22 tablet 0  . ondansetron (ZOFRAN) 4 MG tablet Take 1 tablet (4 mg total) by mouth every 8 (eight) hours as needed for nausea or vomiting. 15 tablet 0  . polyethylene glycol powder (GLYCOLAX/MIRALAX) powder Take 17 g by mouth daily.    . rosuvastatin (CRESTOR) 10 MG tablet Take 10 mg by mouth daily.     No current facility-administered medications for this visit.     Past Medical History  Diagnosis Date  . Brown recluse spider bite   . Lyme disease   . Diverticulitis   . Panic disorder   . Shingles   . Hyperlipidemia   . Migraines   . Constipation     Past Surgical History  Procedure Laterality Date  . Tonsillectomy    . Appendectomy    . Cholecystectomy    . Colonoscopy  2012    RMR: 1. External hemorrhoids, otherwise normal rectum. 2. Left-sided diverticula status post snare polypectomy left colon polyp. Remainder of the colonic mucosa and terminal ileum mucoa appeared normal.      Social History   Social History  . Marital Status: Widowed    Spouse Name: N/A  . Number of Children: 5  . Years of Education: N/A   Occupational History  . Not on file.   Social History Main Topics  . Smoking status: Never Smoker   . Smokeless tobacco: Never Used  . Alcohol Use: No  . Drug Use: No  . Sexual Activity: Not on file   Other Topics Concern  . Not on file   Social History Narrative    ROS: no fevers or chills, productive cough, hemoptysis, dysphasia, odynophagia, melena, hematochezia, dysuria, hematuria, rash, seizure activity, orthopnea, PND, pedal edema, claudication. Remaining systems are negative.  Physical Exam: Well-developed well-nourished in no acute distress.  Skin is warm and dry.  HEENT is normal.  Neck is supple.  Chest is clear to auscultation with normal expansion.  Cardiovascular exam is regular rate and rhythm.  Abdominal exam nontender or distended. No masses palpated. Extremities show no edema. neuro grossly intact  ECG Sinus rhythm at a rate of 69. Cannot rule out prior anterior infarct.

## 2015-11-15 ENCOUNTER — Encounter: Payer: Self-pay | Admitting: Cardiology

## 2015-11-15 ENCOUNTER — Ambulatory Visit (INDEPENDENT_AMBULATORY_CARE_PROVIDER_SITE_OTHER): Payer: Medicare Other | Admitting: Cardiology

## 2015-11-15 VITALS — BP 126/78 | HR 69 | Ht 66.0 in | Wt 180.0 lb

## 2015-11-15 DIAGNOSIS — E785 Hyperlipidemia, unspecified: Secondary | ICD-10-CM | POA: Insufficient documentation

## 2015-11-15 DIAGNOSIS — R002 Palpitations: Secondary | ICD-10-CM | POA: Diagnosis not present

## 2015-11-15 MED ORDER — METOPROLOL SUCCINATE ER 25 MG PO TB24: 25.0000 mg | ORAL_TABLET | Freq: Every day | ORAL | Status: DC

## 2015-11-15 NOTE — Assessment & Plan Note (Signed)
Controlled. Continue beta blocker. 

## 2015-11-15 NOTE — Patient Instructions (Signed)
.  Medication Instructions:   REFILL FOR METOPROLOL HAS BEEN SENT TO THE PHARMACY  Follow-Up:  Your physician wants you to follow-up in: [place] will receive a reminder letter in the mail two months in advance. If you don't receive a letter, please call our office to schedule the follow-up appointment.   If you need a refill on your cardiac medications before your next appointment, please call your pharmacy.

## 2015-11-15 NOTE — Assessment & Plan Note (Signed)
Continue statin. Management per primary care.

## 2015-12-20 ENCOUNTER — Encounter: Payer: Self-pay | Admitting: Internal Medicine

## 2016-01-21 DIAGNOSIS — I7300 Raynaud's syndrome without gangrene: Secondary | ICD-10-CM | POA: Diagnosis not present

## 2016-01-21 DIAGNOSIS — F419 Anxiety disorder, unspecified: Secondary | ICD-10-CM | POA: Diagnosis not present

## 2016-01-21 DIAGNOSIS — S50812A Abrasion of left forearm, initial encounter: Secondary | ICD-10-CM | POA: Diagnosis not present

## 2016-02-13 DIAGNOSIS — L0390 Cellulitis, unspecified: Secondary | ICD-10-CM | POA: Diagnosis not present

## 2016-02-13 DIAGNOSIS — W57XXXA Bitten or stung by nonvenomous insect and other nonvenomous arthropods, initial encounter: Secondary | ICD-10-CM | POA: Diagnosis not present

## 2016-03-20 DIAGNOSIS — Z6829 Body mass index (BMI) 29.0-29.9, adult: Secondary | ICD-10-CM | POA: Diagnosis not present

## 2016-03-20 DIAGNOSIS — Z713 Dietary counseling and surveillance: Secondary | ICD-10-CM | POA: Diagnosis not present

## 2016-03-20 DIAGNOSIS — R252 Cramp and spasm: Secondary | ICD-10-CM | POA: Diagnosis not present

## 2016-03-20 DIAGNOSIS — Z789 Other specified health status: Secondary | ICD-10-CM | POA: Diagnosis not present

## 2016-03-20 DIAGNOSIS — W57XXXA Bitten or stung by nonvenomous insect and other nonvenomous arthropods, initial encounter: Secondary | ICD-10-CM | POA: Diagnosis not present

## 2016-04-28 DIAGNOSIS — W57XXXA Bitten or stung by nonvenomous insect and other nonvenomous arthropods, initial encounter: Secondary | ICD-10-CM | POA: Diagnosis not present

## 2016-04-28 DIAGNOSIS — Z299 Encounter for prophylactic measures, unspecified: Secondary | ICD-10-CM | POA: Diagnosis not present

## 2016-04-28 DIAGNOSIS — S40862A Insect bite (nonvenomous) of left upper arm, initial encounter: Secondary | ICD-10-CM | POA: Diagnosis not present

## 2016-05-27 DIAGNOSIS — N9489 Other specified conditions associated with female genital organs and menstrual cycle: Secondary | ICD-10-CM | POA: Diagnosis not present

## 2016-06-30 DIAGNOSIS — Z299 Encounter for prophylactic measures, unspecified: Secondary | ICD-10-CM | POA: Diagnosis not present

## 2016-06-30 DIAGNOSIS — R350 Frequency of micturition: Secondary | ICD-10-CM | POA: Diagnosis not present

## 2016-06-30 DIAGNOSIS — F419 Anxiety disorder, unspecified: Secondary | ICD-10-CM | POA: Diagnosis not present

## 2016-08-04 DIAGNOSIS — Z1231 Encounter for screening mammogram for malignant neoplasm of breast: Secondary | ICD-10-CM | POA: Diagnosis not present

## 2016-08-06 DIAGNOSIS — R109 Unspecified abdominal pain: Secondary | ICD-10-CM | POA: Diagnosis not present

## 2016-08-06 DIAGNOSIS — R1030 Lower abdominal pain, unspecified: Secondary | ICD-10-CM | POA: Diagnosis not present

## 2016-08-06 DIAGNOSIS — Z713 Dietary counseling and surveillance: Secondary | ICD-10-CM | POA: Diagnosis not present

## 2016-08-06 DIAGNOSIS — Z299 Encounter for prophylactic measures, unspecified: Secondary | ICD-10-CM | POA: Diagnosis not present

## 2016-08-17 DIAGNOSIS — Z01419 Encounter for gynecological examination (general) (routine) without abnormal findings: Secondary | ICD-10-CM | POA: Diagnosis not present

## 2016-08-17 DIAGNOSIS — N8111 Cystocele, midline: Secondary | ICD-10-CM | POA: Diagnosis not present

## 2016-08-17 DIAGNOSIS — N814 Uterovaginal prolapse, unspecified: Secondary | ICD-10-CM | POA: Diagnosis not present

## 2016-08-17 DIAGNOSIS — Z6830 Body mass index (BMI) 30.0-30.9, adult: Secondary | ICD-10-CM | POA: Diagnosis not present

## 2016-08-18 DIAGNOSIS — Z1389 Encounter for screening for other disorder: Secondary | ICD-10-CM | POA: Diagnosis not present

## 2016-08-18 DIAGNOSIS — Z0000 Encounter for general adult medical examination without abnormal findings: Secondary | ICD-10-CM | POA: Diagnosis not present

## 2016-08-18 DIAGNOSIS — Z7189 Other specified counseling: Secondary | ICD-10-CM | POA: Diagnosis not present

## 2016-08-18 DIAGNOSIS — Z1211 Encounter for screening for malignant neoplasm of colon: Secondary | ICD-10-CM | POA: Diagnosis not present

## 2016-08-18 DIAGNOSIS — Z299 Encounter for prophylactic measures, unspecified: Secondary | ICD-10-CM | POA: Diagnosis not present

## 2016-08-19 DIAGNOSIS — R5383 Other fatigue: Secondary | ICD-10-CM | POA: Diagnosis not present

## 2016-08-19 DIAGNOSIS — E7800 Pure hypercholesterolemia, unspecified: Secondary | ICD-10-CM | POA: Diagnosis not present

## 2016-08-19 DIAGNOSIS — Z79899 Other long term (current) drug therapy: Secondary | ICD-10-CM | POA: Diagnosis not present

## 2016-08-24 DIAGNOSIS — Z299 Encounter for prophylactic measures, unspecified: Secondary | ICD-10-CM | POA: Diagnosis not present

## 2016-08-24 DIAGNOSIS — K5732 Diverticulitis of large intestine without perforation or abscess without bleeding: Secondary | ICD-10-CM | POA: Diagnosis not present

## 2016-08-27 ENCOUNTER — Inpatient Hospital Stay (HOSPITAL_COMMUNITY)
Admission: EM | Admit: 2016-08-27 | Discharge: 2016-08-31 | DRG: 392 | Disposition: A | Discharge status: Home / Self Care | Payer: Medicare Other | Attending: Family Medicine | Admitting: Family Medicine

## 2016-08-27 ENCOUNTER — Emergency Department (HOSPITAL_COMMUNITY): Payer: Medicare Other

## 2016-08-27 ENCOUNTER — Encounter (HOSPITAL_COMMUNITY): Payer: Self-pay | Admitting: Emergency Medicine

## 2016-08-27 DIAGNOSIS — I493 Ventricular premature depolarization: Secondary | ICD-10-CM | POA: Diagnosis present

## 2016-08-27 DIAGNOSIS — Z8249 Family history of ischemic heart disease and other diseases of the circulatory system: Secondary | ICD-10-CM

## 2016-08-27 DIAGNOSIS — R1032 Left lower quadrant pain: Secondary | ICD-10-CM | POA: Diagnosis not present

## 2016-08-27 DIAGNOSIS — R109 Unspecified abdominal pain: Secondary | ICD-10-CM | POA: Diagnosis not present

## 2016-08-27 DIAGNOSIS — F419 Anxiety disorder, unspecified: Secondary | ICD-10-CM | POA: Diagnosis not present

## 2016-08-27 DIAGNOSIS — Z7982 Long term (current) use of aspirin: Secondary | ICD-10-CM

## 2016-08-27 DIAGNOSIS — E785 Hyperlipidemia, unspecified: Secondary | ICD-10-CM | POA: Diagnosis present

## 2016-08-27 DIAGNOSIS — Z808 Family history of malignant neoplasm of other organs or systems: Secondary | ICD-10-CM | POA: Diagnosis not present

## 2016-08-27 DIAGNOSIS — K5732 Diverticulitis of large intestine without perforation or abscess without bleeding: Secondary | ICD-10-CM | POA: Diagnosis not present

## 2016-08-27 DIAGNOSIS — Z833 Family history of diabetes mellitus: Secondary | ICD-10-CM | POA: Diagnosis not present

## 2016-08-27 DIAGNOSIS — Z299 Encounter for prophylactic measures, unspecified: Secondary | ICD-10-CM | POA: Diagnosis not present

## 2016-08-27 DIAGNOSIS — R1033 Periumbilical pain: Secondary | ICD-10-CM | POA: Diagnosis not present

## 2016-08-27 DIAGNOSIS — Z79899 Other long term (current) drug therapy: Secondary | ICD-10-CM | POA: Diagnosis not present

## 2016-08-27 LAB — CBC
HCT: 42 % (ref 36.0–46.0)
Hemoglobin: 13.6 g/dL (ref 12.0–15.0)
MCH: 31.9 pg (ref 26.0–34.0)
MCHC: 32.4 g/dL (ref 30.0–36.0)
MCV: 98.4 fL (ref 78.0–100.0)
PLATELETS: 335 10*3/uL (ref 150–400)
RBC: 4.27 MIL/uL (ref 3.87–5.11)
RDW: 13.6 % (ref 11.5–15.5)
WBC: 11 10*3/uL — ABNORMAL HIGH (ref 4.0–10.5)

## 2016-08-27 LAB — COMPREHENSIVE METABOLIC PANEL
ALK PHOS: 98 U/L (ref 38–126)
ALT: 17 U/L (ref 14–54)
ANION GAP: 10 (ref 5–15)
AST: 30 U/L (ref 15–41)
Albumin: 4.4 g/dL (ref 3.5–5.0)
BILIRUBIN TOTAL: 1.2 mg/dL (ref 0.3–1.2)
BUN: 12 mg/dL (ref 6–20)
CALCIUM: 9.6 mg/dL (ref 8.9–10.3)
CO2: 25 mmol/L (ref 22–32)
Chloride: 106 mmol/L (ref 101–111)
Creatinine, Ser: 0.9 mg/dL (ref 0.44–1.00)
GLUCOSE: 94 mg/dL (ref 65–99)
Potassium: 3.4 mmol/L — ABNORMAL LOW (ref 3.5–5.1)
Sodium: 141 mmol/L (ref 135–145)
TOTAL PROTEIN: 7.6 g/dL (ref 6.5–8.1)

## 2016-08-27 LAB — URINALYSIS, ROUTINE W REFLEX MICROSCOPIC
BILIRUBIN URINE: NEGATIVE
Glucose, UA: NEGATIVE mg/dL
HGB URINE DIPSTICK: NEGATIVE
Ketones, ur: NEGATIVE mg/dL
Nitrite: NEGATIVE
PROTEIN: NEGATIVE mg/dL
Specific Gravity, Urine: 1.013 (ref 1.005–1.030)
pH: 5.5 (ref 5.0–8.0)

## 2016-08-27 LAB — URINE MICROSCOPIC-ADD ON: RBC / HPF: NONE SEEN RBC/hpf (ref 0–5)

## 2016-08-27 LAB — LIPASE, BLOOD: Lipase: 32 U/L (ref 11–51)

## 2016-08-27 MED ORDER — ROSUVASTATIN CALCIUM 10 MG PO TABS
10.0000 mg | ORAL_TABLET | Freq: Every day | ORAL | Status: DC
  Administered 2016-08-28 – 2016-08-29 (×2): 10 mg via ORAL
  Filled 2016-08-27 (×2): qty 1

## 2016-08-27 MED ORDER — ALPRAZOLAM 0.25 MG PO TABS
1.0000 mg | ORAL_TABLET | Freq: Once | ORAL | Status: AC
  Administered 2016-08-27: 1 mg via ORAL
  Filled 2016-08-27: qty 4

## 2016-08-27 MED ORDER — ACETAMINOPHEN 325 MG PO TABS
650.0000 mg | ORAL_TABLET | Freq: Four times a day (QID) | ORAL | Status: DC | PRN
  Administered 2016-08-28 – 2016-08-30 (×2): 650 mg via ORAL
  Filled 2016-08-27 (×3): qty 2

## 2016-08-27 MED ORDER — ACETAMINOPHEN 650 MG RE SUPP: 650.0000 mg | Freq: Four times a day (QID) | RECTAL | Status: DC | PRN

## 2016-08-27 MED ORDER — IOPAMIDOL (ISOVUE-300) INJECTION 61%
INTRAVENOUS | Status: AC
  Administered 2016-08-27: 100 mL
  Filled 2016-08-27: qty 100

## 2016-08-27 MED ORDER — HYDROMORPHONE HCL 2 MG/ML IJ SOLN
0.5000 mg | Freq: Once | INTRAMUSCULAR | Status: AC
  Administered 2016-08-27: 0.5 mg via INTRAVENOUS
  Filled 2016-08-27: qty 1

## 2016-08-27 MED ORDER — KCL IN DEXTROSE-NACL 20-5-0.9 MEQ/L-%-% IV SOLN
INTRAVENOUS | Status: AC
  Administered 2016-08-27 – 2016-08-28 (×3): via INTRAVENOUS
  Filled 2016-08-27 (×4): qty 1000

## 2016-08-27 MED ORDER — PIPERACILLIN-TAZOBACTAM 3.375 G IVPB
3.3750 g | Freq: Three times a day (TID) | INTRAVENOUS | Status: DC
  Administered 2016-08-28 – 2016-08-30 (×8): 3.375 g via INTRAVENOUS
  Filled 2016-08-27 (×10): qty 50

## 2016-08-27 MED ORDER — PIPERACILLIN-TAZOBACTAM 3.375 G IVPB 30 MIN
3.3750 g | Freq: Once | INTRAVENOUS | Status: AC
  Administered 2016-08-27: 3.375 g via INTRAVENOUS
  Filled 2016-08-27: qty 50

## 2016-08-27 MED ORDER — FENTANYL CITRATE (PF) 100 MCG/2ML IJ SOLN: 25.0000 ug | INTRAMUSCULAR | Status: DC | PRN

## 2016-08-27 MED ORDER — ONDANSETRON HCL 4 MG/2ML IJ SOLN: 4.0000 mg | Freq: Four times a day (QID) | INTRAMUSCULAR | Status: DC | PRN

## 2016-08-27 MED ORDER — ONDANSETRON HCL 4 MG PO TABS: 4.0000 mg | ORAL_TABLET | Freq: Four times a day (QID) | ORAL | Status: DC | PRN

## 2016-08-27 MED ORDER — ALPRAZOLAM 0.25 MG PO TABS
1.0000 mg | ORAL_TABLET | Freq: Four times a day (QID) | ORAL | Status: DC | PRN
  Administered 2016-08-28 – 2016-08-31 (×13): 1 mg via ORAL
  Filled 2016-08-27 (×13): qty 4

## 2016-08-27 MED ORDER — METOPROLOL SUCCINATE ER 25 MG PO TB24
12.5000 mg | ORAL_TABLET | Freq: Every evening | ORAL | Status: DC
  Administered 2016-08-28 – 2016-08-30 (×3): 12.5 mg via ORAL
  Filled 2016-08-27 (×4): qty 1

## 2016-08-27 NOTE — Progress Notes (Signed)
NURSING PROGRESS NOTE  COSSETTE GATSON [id number] Admission Data: 08/27/2016 11:34 PM Attending Provider: Rise Patience, MD [id number] Barbette Reichmann, MD Code Status: full  Allergies:  Ciprofloxacin; Codeine; Metronidazole; Morphine and related; and Other Past Medical History:   has a past medical history of Brown recluse spider bite; Constipation; Diverticulitis; Hyperlipidemia; Lyme disease; Migraines; Panic disorder; and Shingles. Past Surgical History:   has a past surgical history that includes Tonsillectomy; Appendectomy; Cholecystectomy; and Colonoscopy (2012). Social History:   reports that she has never smoked. She has never used smokeless tobacco. She reports that she does not drink alcohol or use drugs.  GENASIS BARSZCZ is a 70 y.o. female patient admitted from ED:   Last Documented Vital Signs: Blood pressure (!) 153/84, pulse 75, temperature 98.1 F (36.7 C), temperature source Oral, resp. rate 16, height 5\' 6"  (QA348G m), weight 82.5 kg (181 lb 12.8 oz), SpO2 98 %.  Cardiac Monitoring: NA  IV Fluids:  IV in place, occlusive dsg intact without redness, IV cath hand right, condition patent and no redness D5W/0.9 NaCl.   Skin: intact, dry, ecchymosis  Patient/Family orientated to room. Information packet given to patient/family. Admission inpatient armband information verified with patient/family to include name and date of birth and placed on patient arm. Side rails up x 2, fall assessment and education completed with patient/family. Patient/family able to verbalize understanding of risk associated with falls and verbalized understanding to call for assistance before getting out of bed. Call light within reach. Patient/family able to voice and demonstrate understanding of unit orientation instructions.    Will continue to evaluate and treat per MD orders.   Amaryllis Dyke, RN

## 2016-08-27 NOTE — ED Triage Notes (Signed)
History of diverticulitis. Saw PCP on Tuesday and started on Augmentin. Went back today for follow up because not improved. PCP said go to hospital for CT scan.

## 2016-08-27 NOTE — ED Notes (Signed)
Notified Jessica, [place] that pt IV blew while in CT and CT could not be obtain.

## 2016-08-27 NOTE — ED Notes (Signed)
Patient transported to CT 

## 2016-08-27 NOTE — ED Notes (Signed)
Patient in CT

## 2016-08-27 NOTE — H&P (Signed)
History and Physical    Alexa Wilson [id number] DOB: 08/20/1946 DOA: 08/27/2016  PCP: Glenda Chroman, MD  Patient coming from: Home.  Chief Complaint: Abdominal pain.  HPI: Alexa Wilson is a 70 y.o. female with history of hyperlipidemia and PVCs has been experiencing abdominal pain since Monday 4 days ago. Patient had gone to her PCP the following day and was prescribed Augmentin. Despite taking which patient's abdominal pain worsened. Has been having poor appetite but denies any nausea vomiting or diarrhea. CT abdomen and pelvis shows sigmoid colitis with no perforation or abscess. Patient has been admitted for IV antibiotics and close follow-up since patient has significant pain and poor appetite due to pain. Patient denies any fever or chills. Patient states she is due for colonoscopy later this year.  ED Course: CT scan was done which shows sigmoid diverticulitis with no perforation or abscess.  Review of Systems: As per HPI, rest all negative.   Past Medical History:  Diagnosis Date  . Brown recluse spider bite   . Constipation   . Diverticulitis   . Hyperlipidemia   . Lyme disease   . Migraines   . Panic disorder   . Shingles     Past Surgical History:  Procedure Laterality Date  . APPENDECTOMY    . CHOLECYSTECTOMY    . COLONOSCOPY  2012   RMR: 1. External hemorrhoids, otherwise normal rectum. 2. Left-sided diverticula status post snare polypectomy left colon polyp. Remainder of the colonic mucosa and terminal ileum mucoa appeared normal.   . TONSILLECTOMY       reports that she has never smoked. She has never used smokeless tobacco. She reports that she does not drink alcohol or use drugs.  Allergies  Allergen Reactions  . Ciprofloxacin Nausea Only  . Codeine Other (See Comments)    Increases anxiety, Makes me feel all whoozie  . Metronidazole Nausea Only  . Morphine And Related Nausea And Vomiting  . Other Other (See Comments)    All antihistamines  cause severe insomnia    Family History  Problem Relation Age of Onset  . Diabetes Mother   . Hypertension Mother   . Heart attack Mother   . Cancer Father     Melanoma   . Diabetes Brother   . Diabetes Son     Prior to Admission medications   Medication Sig Start Date End Date Taking? Authorizing Provider  alprazolam Duanne Moron) 2 MG tablet Take 1 mg by mouth every 6 (six) hours. Scheduled doses May take half tablet   Yes Historical Provider, MD  amoxicillin-clavulanate (AUGMENTIN) 500-125 MG tablet Take 1 tablet by mouth 2 (two) times daily.   Yes Historical Provider, MD  aspirin 81 MG chewable tablet Chew 81 mg by mouth daily as needed.   Yes Historical Provider, MD  metoprolol succinate (TOPROL-XL) 25 MG 24 hr tablet Take 1 tablet (25 mg total) by mouth daily. Patient taking differently: Take 12.5 mg by mouth every evening.  11/15/15  Yes Lelon Perla, MD  ondansetron (ZOFRAN) 4 MG tablet Take 1 tablet (4 mg total) by mouth every 8 (eight) hours as needed for nausea or vomiting. 02/19/14  Yes Clayton Bibles, PA-C  rosuvastatin (CRESTOR) 10 MG tablet Take 10 mg by mouth daily. 10/23/15  Yes Historical Provider, MD  cholecalciferol (VITAMIN D) 1000 UNITS tablet Take 2,000 Units by mouth every morning.     Historical Provider, MD  docusate sodium 100 MG CAPS Take 100 mg by mouth  daily. Patient not taking: Reported on 08/27/2016 07/16/13   Erline Hau, MD    Physical Exam: Vitals:   08/27/16 1900 08/27/16 1915 08/27/16 1930 08/27/16 2215  BP: 153/85 145/72 146/78 116/64  Pulse: 80 72 84 75  Resp: 10 17 15 11   Temp:      TempSrc:      SpO2: 100% 100% 100% 99%  Weight:      Height:          Constitutional: Moderately built and nourished. Vitals:   08/27/16 1900 08/27/16 1915 08/27/16 1930 08/27/16 2215  BP: 153/85 145/72 146/78 116/64  Pulse: 80 72 84 75  Resp: 10 17 15 11   Temp:      TempSrc:      SpO2: 100% 100% 100% 99%  Weight:      Height:       Eyes:  Anicteric no pallor. ENMT: No discharge from the ears eyes nose or mouth. Neck: No mass felt. No JVD appreciated. Respiratory: No rhonchi or crepitations. Cardiovascular: S1 and S2 heard no murmurs appreciated. Abdomen: Tenderness in the left lower quadrant. Soft bowel sounds present. No guarding or rigidity. Musculoskeletal: No edema. No joint effusion. Skin: No rash. Skin appears warm. Neurologic: Alert awake oriented to time place and person. Moves all activities. Psychiatric: Appears normal. Normal affect.   Labs on Admission: I have personally reviewed following labs and imaging studies  CBC:  Recent Labs Lab 08/27/16 1502  WBC 11.0*  HGB 13.6  HCT 42.0  MCV 98.4  PLT 123456   Basic Metabolic Panel:  Recent Labs Lab 08/27/16 1502  NA 141  K 3.4*  CL 106  CO2 25  GLUCOSE 94  BUN 12  CREATININE 0.90  CALCIUM 9.6   GFR: Estimated Creatinine Clearance: 63.9 mL/min (by C-G formula based on SCr of 0.9 mg/dL). Liver Function Tests:  Recent Labs Lab 08/27/16 1502  AST 30  ALT 17  ALKPHOS 98  BILITOT 1.2  PROT 7.6  ALBUMIN 4.4    Recent Labs Lab 08/27/16 1502  LIPASE 32   No results for input(s): AMMONIA in the last 168 hours. Coagulation Profile: No results for input(s): INR, PROTIME in the last 168 hours. Cardiac Enzymes: No results for input(s): CKTOTAL, CKMB, CKMBINDEX, TROPONINI in the last 168 hours. BNP (last 3 results) No results for input(s): PROBNP in the last 8760 hours. HbA1C: No results for input(s): HGBA1C in the last 72 hours. CBG: No results for input(s): GLUCAP in the last 168 hours. Lipid Profile: No results for input(s): CHOL, HDL, LDLCALC, TRIG, CHOLHDL, LDLDIRECT in the last 72 hours. Thyroid Function Tests: No results for input(s): TSH, T4TOTAL, FREET4, T3FREE, THYROIDAB in the last 72 hours. Anemia Panel: No results for input(s): VITAMINB12, FOLATE, FERRITIN, TIBC, IRON, RETICCTPCT in the last 72 hours. Urine analysis:      Component Value Date/Time   COLORURINE YELLOW 08/27/2016 [place] 08/27/2016 1519   LABSPEC 1.013 08/27/2016 1519   PHURINE 5.5 08/27/2016 1519   GLUCOSEU NEGATIVE 08/27/2016 1519   HGBUR NEGATIVE 08/27/2016 1519   BILIRUBINUR NEGATIVE 08/27/2016 1519   KETONESUR NEGATIVE 08/27/2016 1519   PROTEINUR NEGATIVE 08/27/2016 1519   UROBILINOGEN 0.2 02/19/2014 0850   NITRITE NEGATIVE 08/27/2016 1519   LEUKOCYTESUR TRACE (A) 08/27/2016 1519   Sepsis Labs: @LABRCNTIP (procalcitonin:4,lacticidven:4) )No results found for this or any previous visit (from the past 240 hour(s)).   Radiological Exams on Admission: Ct Abdomen Pelvis W Contrast  Result Date: 08/27/2016  CLINICAL DATA:  Abdominal pain for 1 week. EXAM: CT ABDOMEN AND PELVIS WITH CONTRAST TECHNIQUE: Multidetector CT imaging of the abdomen and pelvis was performed using the standard protocol following bolus administration of intravenous contrast. CONTRAST:  137mL ISOVUE-300 IOPAMIDOL (ISOVUE-300) INJECTION 61% COMPARISON:  06/27/2015; radiographs from 08/06/2016 FINDINGS: Lower chest: Subsegmental atelectasis or scar in the left lower lobe. Small type 1 hiatal hernia. Hepatobiliary: Minimal intrahepatic biliary dilatation. Common bile duct about 6 mm in diameter. Gallbladder not seen, presumed surgically absent. I do not see a mass along the porta hepatis, the liver appears otherwise unremarkable. Pancreas: Unremarkable Spleen: Unremarkable Adrenals/Urinary Tract: Mixed injection with portal venous phase contrast in also excreted contrast in the renal collecting systems. Contrast medium seen in the urinary bladder. There is no filling defect along the urothelium. Adrenal glands normal. Stomach/Bowel: Moderate to prominent sigmoid diverticulitis involving the proximal sigmoid colon, with multiple inflamed diverticula in the region of involvement. I do not see any definite extraluminal gas or discrete abscess, several enhancing  diverticula and one gas filled diverticulum in the region of involvement. Vascular/Lymphatic: Aortoiliac atherosclerotic vascular disease. No pathologic adenopathy identified. Reproductive: Unremarkable Other: No supplemental non-categorized findings. Musculoskeletal: Levoconvex lumbar scoliosis. Suspected left foraminal stenosis due to a left lateral recess and foraminal disc protrusion at L5-S1. IMPRESSION: 1. Moderate to prominent proximal sigmoid colon diverticulitis with considerable inflammatory stranding in the region, but no extraluminal gas or abscess. 2. Left foraminal impingement at L5-S1 due to a disc protrusion. Levoconvex lumbar scoliosis. 3. Minimal intrahepatic biliary dilatation, probably incidental. 4.  Aortoiliac atherosclerotic vascular disease. 5. Small type 1 hiatal hernia. Electronically Signed   By: Van Clines M.D.   On: 08/27/2016 20:31     Assessment/Plan Principal Problem:   Sigmoid diverticulitis Active Problems:   Hyperlipidemia    1. Sigmoid diverticulitis - since patient has significant pain we will keep patient nothing by mouth except medications and IV fluids for which I have ordered D5 normal saline. Pain relief medications and continue IV Zosyn. If pain resolves or improves may consider advancing diet. Otherwise may need surgical input. 2. History of PVCs and palpitations on metoprolol which will be continued. 3. History of hyperlipidemia on statins.  Patient is due for colonoscopy later this year with Dr. Sydell Axon in [place]. Patient had one 5 years ago.   DVT prophylaxis: SCDs. Code Status: Full code.  Family Communication: Discussed with patient.  Disposition Plan: Home.  Consults called: None.  Admission status: Inpatient likely stage II to 3 days.    Rise Patience MD [facility] Pager [phone].  If 7PM-7AM, please contact night-coverage [email or web address] Password [email or web address]  08/27/2016, 11:00 PM

## 2016-08-27 NOTE — Progress Notes (Signed)
IV extravasated 50 cc (25cc of saline, 25 cc) from right [facility]

## 2016-08-27 NOTE — Progress Notes (Signed)
Received report from [place], RN in ED for transfer of pt to [phone].

## 2016-08-27 NOTE — ED Provider Notes (Signed)
[facility] DEPT Provider Note   CSN: [id number] Arrival date & time: 08/27/16  1441     History   Chief Complaint Chief Complaint  Patient presents with  . Abdominal Pain    HPI Alexa Wilson is a 70 y.o. female.  HPI   Patient is a 70 year old female with history of diverticulitis, Lyme disease, panic disorder who presents to the emergency department with abdominal pain for 4 days. Patient states she woke up Monday morning with intermittent abdominal pain in the left lower quadrant and suprapubic region with associated distention and nausea. She states this pain as achy/throbbing, 7/10 with associated intermittent lower back pain. Patient was seen by her PCP on Tuesday and placed on Augmentin. She thinks it is diverticulitis. She states she's taken 4 doses of Augmentin and has not experienced any relief. Her PCP sent her here today for CT scan of her abdomen. Patient denies diarrhea, blood in her stool, vomiting, fever, chills, chest pain, shortness breath, syncope.  Past Medical History:  Diagnosis Date  . Brown recluse spider bite   . Constipation   . Diverticulitis   . Hyperlipidemia   . Lyme disease   . Migraines   . Panic disorder   . Shingles     Patient Active Problem List   Diagnosis Date Noted  . Hyperlipidemia 11/15/2015  . Constipation 04/09/2015  . Palpitations 08/11/2013  . Chest pressure 07/15/2013  . Right arm weakness 07/15/2013  . Sigmoid diverticulitis 07/14/2012  . DYSPEPSIA 09/06/2009  . IBS 09/06/2009  . COLONIC POLYPS, HX OF 09/06/2009  . WEIGHT LOSS 06/07/2009  . NAUSEA AND VOMITING 06/07/2009  . ABDOMINAL PAIN 06/07/2009  . EPIGASTRIC PAIN 06/07/2009  . COLONIC POLYPS 02/21/2009  . DYSPHAGIA 02/21/2009  . PANIC DISORDER 02/20/2009  . CATARACTS 02/20/2009  . DIVERTICULOSIS OF COLON 02/20/2009  . HELICOBACTER PYLORI GASTRITIS, HX OF 02/20/2009    Past Surgical History:  Procedure Laterality Date  . APPENDECTOMY    .  CHOLECYSTECTOMY    . COLONOSCOPY  2012   RMR: 1. External hemorrhoids, otherwise normal rectum. 2. Left-sided diverticula status post snare polypectomy left colon polyp. Remainder of the colonic mucosa and terminal ileum mucoa appeared normal.   . TONSILLECTOMY      OB History    No data available       Home Medications    Prior to Admission medications   Medication Sig Start Date End Date Taking? Authorizing Provider  alprazolam Duanne Moron) 2 MG tablet Take 1 mg by mouth every 6 (six) hours. Scheduled doses May take half tablet   Yes Historical Provider, MD  amoxicillin-clavulanate (AUGMENTIN) 500-125 MG tablet Take 1 tablet by mouth 2 (two) times daily.   Yes Historical Provider, MD  aspirin 81 MG chewable tablet Chew 81 mg by mouth daily as needed.   Yes Historical Provider, MD  metoprolol succinate (TOPROL-XL) 25 MG 24 hr tablet Take 1 tablet (25 mg total) by mouth daily. Patient taking differently: Take 12.5 mg by mouth every evening.  11/15/15  Yes Lelon Perla, MD  ondansetron (ZOFRAN) 4 MG tablet Take 1 tablet (4 mg total) by mouth every 8 (eight) hours as needed for nausea or vomiting. 02/19/14  Yes Clayton Bibles, PA-C  rosuvastatin (CRESTOR) 10 MG tablet Take 10 mg by mouth daily. 10/23/15  Yes Historical Provider, MD  cholecalciferol (VITAMIN D) 1000 UNITS tablet Take 2,000 Units by mouth every morning.     Historical Provider, MD  docusate sodium 100 MG CAPS  Take 100 mg by mouth daily. Patient not taking: Reported on 08/27/2016 07/16/13   Erline Hau, MD    Family History Family History  Problem Relation Age of Onset  . Diabetes Mother   . Hypertension Mother   . Heart attack Mother   . Cancer Father     Melanoma   . Diabetes Brother   . Diabetes Son     Social History Social History  Substance Use Topics  . Smoking status: Never Smoker  . Smokeless tobacco: Never Used  . Alcohol use No     Allergies   Ciprofloxacin; Codeine; Metronidazole;  Morphine and related; and Other   Review of Systems Review of Systems  Constitutional: Positive for chills. Negative for diaphoresis and fever.  HENT: Negative for trouble swallowing.   Eyes: Negative for visual disturbance.  Respiratory: Negative for shortness of breath.   Cardiovascular: Negative for chest pain.  Gastrointestinal: Positive for abdominal distention, abdominal pain and nausea. Negative for blood in stool, constipation, diarrhea and vomiting.  Genitourinary: Negative for dysuria and hematuria.  Musculoskeletal: Positive for back pain. Negative for neck pain.  Skin: Negative for rash.  Neurological: Negative for dizziness, syncope, weakness, numbness and headaches.     Physical Exam Updated Vital Signs BP 149/84   Pulse 87   Temp 98.2 F (36.8 C) (Oral)   Resp 14   Ht 5\' 6"  (1.676 m)   Wt 82.6 kg   SpO2 100%   BMI 29.38 kg/m   Physical Exam  Constitutional: She appears well-developed and well-nourished. No distress.  HENT:  Head: Normocephalic and atraumatic.  Eyes: Conjunctivae are normal.  Cardiovascular: Normal rate, regular rhythm and normal heart sounds.  Exam reveals no gallop and no friction rub.   No murmur heard. Pulmonary/Chest: Effort normal. No respiratory distress. She has no wheezes. She has no rales.  Abdominal: Soft. Normal appearance and bowel sounds are normal. She exhibits no distension. There is tenderness in the suprapubic area and left lower quadrant. There is no CVA tenderness, no tenderness at McBurney's point and negative Murphy's sign.  Musculoskeletal: Normal range of motion.  Neurological: She is alert. Coordination normal.  Skin: Skin is warm and dry. She is not diaphoretic.  Psychiatric: She has a normal mood and affect. Her behavior is normal.  Nursing note and vitals reviewed.    ED Treatments / Results  Labs (all labs ordered are listed, but only abnormal results are displayed) Labs Reviewed  COMPREHENSIVE METABOLIC  PANEL - Abnormal; Notable for the following:       Result Value   Potassium 3.4 (*)    All other components within normal limits  CBC - Abnormal; Notable for the following:    WBC 11.0 (*)    All other components within normal limits  URINALYSIS, ROUTINE W REFLEX MICROSCOPIC (NOT AT [facility]) - Abnormal; Notable for the following:    Leukocytes, UA TRACE (*)    All other components within normal limits  URINE MICROSCOPIC-ADD ON - Abnormal; Notable for the following:    Squamous Epithelial / LPF 6-30 (*)    Bacteria, UA RARE (*)    All other components within normal limits  LIPASE, BLOOD  COMPREHENSIVE METABOLIC PANEL  CBC WITH DIFFERENTIAL/PLATELET    EKG  EKG Interpretation None       Radiology Ct Abdomen Pelvis W Contrast  Result Date: 08/27/2016 CLINICAL DATA:  Abdominal pain for 1 week. EXAM: CT ABDOMEN AND PELVIS WITH CONTRAST TECHNIQUE: Multidetector CT imaging  of the abdomen and pelvis was performed using the standard protocol following bolus administration of intravenous contrast. CONTRAST:  111mL ISOVUE-300 IOPAMIDOL (ISOVUE-300) INJECTION 61% COMPARISON:  06/27/2015; radiographs from 08/06/2016 FINDINGS: Lower chest: Subsegmental atelectasis or scar in the left lower lobe. Small type 1 hiatal hernia. Hepatobiliary: Minimal intrahepatic biliary dilatation. Common bile duct about 6 mm in diameter. Gallbladder not seen, presumed surgically absent. I do not see a mass along the porta hepatis, the liver appears otherwise unremarkable. Pancreas: Unremarkable Spleen: Unremarkable Adrenals/Urinary Tract: Mixed injection with portal venous phase contrast in also excreted contrast in the renal collecting systems. Contrast medium seen in the urinary bladder. There is no filling defect along the urothelium. Adrenal glands normal. Stomach/Bowel: Moderate to prominent sigmoid diverticulitis involving the proximal sigmoid colon, with multiple inflamed diverticula in the region of involvement. I  do not see any definite extraluminal gas or discrete abscess, several enhancing diverticula and one gas filled diverticulum in the region of involvement. Vascular/Lymphatic: Aortoiliac atherosclerotic vascular disease. No pathologic adenopathy identified. Reproductive: Unremarkable Other: No supplemental non-categorized findings. Musculoskeletal: Levoconvex lumbar scoliosis. Suspected left foraminal stenosis due to a left lateral recess and foraminal disc protrusion at L5-S1. IMPRESSION: 1. Moderate to prominent proximal sigmoid colon diverticulitis with considerable inflammatory stranding in the region, but no extraluminal gas or abscess. 2. Left foraminal impingement at L5-S1 due to a disc protrusion. Levoconvex lumbar scoliosis. 3. Minimal intrahepatic biliary dilatation, probably incidental. 4.  Aortoiliac atherosclerotic vascular disease. 5. Small type 1 hiatal hernia. Electronically Signed   By: Van Clines M.D.   On: 08/27/2016 20:31    Procedures Procedures (including critical care time)  Medications Ordered in ED Medications  metoprolol succinate (TOPROL-XL) 24 hr tablet 12.5 mg (not administered)  ALPRAZolam (XANAX) tablet 1 mg (not administered)  rosuvastatin (CRESTOR) tablet 10 mg (not administered)  acetaminophen (TYLENOL) tablet 650 mg (not administered)    Or  acetaminophen (TYLENOL) suppository 650 mg (not administered)  ondansetron (ZOFRAN) tablet 4 mg (not administered)    Or  ondansetron (ZOFRAN) injection 4 mg (not administered)  dextrose 5 % and 0.9 % NaCl with KCl 20 mEq/L infusion (not administered)  fentaNYL (SUBLIMAZE) injection 25 mcg (not administered)  piperacillin-tazobactam (ZOSYN) IVPB 3.375 g (not administered)  iopamidol (ISOVUE-300) 61 % injection (100 mLs  Contrast Given 08/27/16 1745)  HYDROmorphone (DILAUDID) injection 0.5 mg (0.5 mg Intravenous Given 08/27/16 1900)  piperacillin-tazobactam (ZOSYN) IVPB 3.375 g (3.375 g Intravenous  Transfusing/Transfer 08/27/16 2218)  ALPRAZolam Duanne Moron) tablet 1 mg (1 mg Oral Given 08/27/16 2132)     Initial Impression / Assessment and Plan / ED Course  I have reviewed the triage vital signs and the nursing notes.  Pertinent labs & imaging results that were available during my care of the patient were reviewed by me and considered in my medical decision making (see chart for details).  Clinical Course   Patient with lower left quadrant abdominal pain for 4 days. Patient was placed on Augmentin for possible diverticulitis by her PCP. Patient was sent here today by her PCP for CT scan. CT scan reviewed by me revealed proximal sigmoid diverticulitis without signs of perforation or abscess. Because patient failed outpatient therapy for her diverticulitis I will consult the hospital seemed for admission for IV antibiotics. Patient was started on fluids and Zosyn in the ED. labs unremarkable. Patient with mild leukocytosis.  I spoke with Dr. Hal Hope who will admit the pt for further treatment of her diverticulosis. Thank you Dr. Hal Hope for your  consult, time and care of this patient.   Patient case discussed and patient seen by Dr. Regenia Skeeter who agrees with the above plan.  Final Clinical Impressions(s) / ED Diagnoses   Final diagnoses:  Abdominal pain, unspecified abdominal location    New Prescriptions Current Discharge Medication List       Kalman Drape, PA 08/27/16 [place], PA 08/27/16 [id number]    Sherwood Gambler, MD 08/29/16 1011

## 2016-08-27 NOTE — Progress Notes (Signed)
Pharmacy Antibiotic Note  Alexa Wilson is a 70 y.o. female admitted on 08/27/2016 with intra-abdominal infection.  Pharmacy has been consulted for Zosyn dosing. Has been receiving outpatient Augmentin since Tuesday. WBC 11. Renal function good.   Plan: Zosyn 3.375G IV q8h to be infused over 4 hours Trend WBC, temp, renal function  F/U infectious work-up  Height: 5\' 6"  (167.6 cm) Weight: 182 lb (82.6 kg) IBW/kg (Calculated) : 59.3  Temp (24hrs), Avg:98.2 F (36.8 C), Min:98.2 F (36.8 C), Max:98.2 F (36.8 C)   Recent Labs Lab 08/27/16 1502  WBC 11.0*  CREATININE 0.90    Estimated Creatinine Clearance: 63.9 mL/min (by C-G formula based on SCr of 0.9 mg/dL).    Allergies  Allergen Reactions  . Ciprofloxacin Nausea Only  . Codeine Other (See Comments)    Increases anxiety, Makes me feel all whoozie  . Metronidazole Nausea Only  . Morphine And Related Nausea And Vomiting  . Other Other (See Comments)    All antihistamines cause severe insomnia    Narda Bonds 08/27/2016 11:18 PM

## 2016-08-27 NOTE — ED Notes (Signed)
Son's Ovid Curd) number [phone]

## 2016-08-28 DIAGNOSIS — K5732 Diverticulitis of large intestine without perforation or abscess without bleeding: Principal | ICD-10-CM

## 2016-08-28 LAB — CBC WITH DIFFERENTIAL/PLATELET
BASOS ABS: 0 10*3/uL (ref 0.0–0.1)
Basophils Relative: 1 %
EOS ABS: 0.1 10*3/uL (ref 0.0–0.7)
EOS PCT: 2 %
HCT: 35.4 % — ABNORMAL LOW (ref 36.0–46.0)
Hemoglobin: 11.4 g/dL — ABNORMAL LOW (ref 12.0–15.0)
LYMPHS PCT: 37 %
Lymphs Abs: 2.1 10*3/uL (ref 0.7–4.0)
MCH: 31.5 pg (ref 26.0–34.0)
MCHC: 32.2 g/dL (ref 30.0–36.0)
MCV: 97.8 fL (ref 78.0–100.0)
Monocytes Absolute: 0.6 10*3/uL (ref 0.1–1.0)
Monocytes Relative: 11 %
Neutro Abs: 2.8 10*3/uL (ref 1.7–7.7)
Neutrophils Relative %: 49 %
PLATELETS: 267 10*3/uL (ref 150–400)
RBC: 3.62 MIL/uL — AB (ref 3.87–5.11)
RDW: 13.6 % (ref 11.5–15.5)
WBC: 5.6 10*3/uL (ref 4.0–10.5)

## 2016-08-28 LAB — COMPREHENSIVE METABOLIC PANEL
ALT: 16 U/L (ref 14–54)
AST: 26 U/L (ref 15–41)
Albumin: 3.3 g/dL — ABNORMAL LOW (ref 3.5–5.0)
Alkaline Phosphatase: 80 U/L (ref 38–126)
Anion gap: 7 (ref 5–15)
BILIRUBIN TOTAL: 1.3 mg/dL — AB (ref 0.3–1.2)
BUN: 10 mg/dL (ref 6–20)
CO2: 25 mmol/L (ref 22–32)
Calcium: 8.6 mg/dL — ABNORMAL LOW (ref 8.9–10.3)
Chloride: 108 mmol/L (ref 101–111)
Creatinine, Ser: 0.83 mg/dL (ref 0.44–1.00)
GFR calc Af Amer: >60 mL/min (ref >60)
Glucose, Bld: 101 mg/dL — ABNORMAL HIGH (ref 65–99)
POTASSIUM: 3.4 mmol/L — AB (ref 3.5–5.1)
Sodium: 140 mmol/L (ref 135–145)
TOTAL PROTEIN: 6 g/dL — AB (ref 6.5–8.1)

## 2016-08-28 NOTE — Discharge Summary (Signed)
Alexa Wilson [id number] DOB: Feb 16, 1946 DOA: 08/27/2016 PCP: Glenda Chroman, MD  Brief narrative: 21 ? Known h/o palpitations Exercise treadmill poor tolerance Monitor 2014 showed pvc and pac Prior diverticulitis 01/2009, EGD 11/2008 showed normal esophagus Last colonoscopy 01/2011-ext hemorrhoid  Admitted after seeing PCP and having abd pain In Ed found to have diverticulitis   Past medical history-As per Problem list Chart reviewed as below-   Consultants:    Procedures:    Antibiotics:  Zosyn 10/26   Subjective   Alert pleasant in nad Some mild discomfort, no acute pain in abd No n/v No stool   Objective    Interim History:   Telemetry:    Objective: Vitals:   08/27/16 2215 08/27/16 2320 08/27/16 2320 08/28/16 0615  BP: 116/64  (!) 153/84 (!) 148/66  Pulse: 75  75 87  Resp: 11  16 18   Temp:   98.1 F (36.7 C) 98.5 F (36.9 C)  TempSrc:   Oral Oral  SpO2: 99%  98% 99%  Weight:  82.5 kg (181 lb 12.8 oz)  82.3 kg (181 lb 7 oz)  Height:  5\' 6"  (1.676 m)      Intake/Output Summary (Last 24 hours) at 08/28/16 1031 Last data filed at 08/28/16 0617  Gross per 24 hour  Intake           421.67 ml  Output              600 ml  Net          -178.33 ml    Exam:  General: eomi ncat Cardiovascular: s1 s2 no m/r/g Respiratory: clear no added sound Abdomen soft, mildly tender in lower quadrant Skin no le edema Neuro intact  Data Reviewed: Basic Metabolic Panel:  Recent Labs Lab 08/27/16 1502 08/28/16 0611  NA 141 140  K 3.4* 3.4*  CL 106 108  CO2 25 25  GLUCOSE 94 101*  BUN 12 10  CREATININE 0.90 0.83  CALCIUM 9.6 8.6*   Liver Function Tests:  Recent Labs Lab 08/27/16 1502 08/28/16 0611  AST 30 26  ALT 17 16  ALKPHOS 98 80  BILITOT 1.2 1.3*  PROT 7.6 6.0*  ALBUMIN 4.4 3.3*    Recent Labs Lab 08/27/16 1502  LIPASE 32   No results for input(s): AMMONIA in the last 168 hours. CBC:  Recent Labs Lab 08/27/16 1502  08/28/16 0611  WBC 11.0* 5.6  NEUTROABS  --  2.8  HGB 13.6 11.4*  HCT 42.0 35.4*  MCV 98.4 97.8  PLT 335 267   Cardiac Enzymes: No results for input(s): CKTOTAL, CKMB, CKMBINDEX, TROPONINI in the last 168 hours. BNP: Invalid input(s): POCBNP CBG: No results for input(s): GLUCAP in the last 168 hours.  No results found for this or any previous visit (from the past 240 hour(s)).   Studies:              All Imaging reviewed and is as per above notation   Scheduled Meds: . metoprolol succinate  12.5 mg Oral QPM  . piperacillin-tazobactam (ZOSYN)  IV  3.375 g Intravenous Q8H  . rosuvastatin  10 mg Oral Daily   Continuous Infusions: . dextrose 5 % and 0.9 % NaCl with KCl 20 mEq/L 100 mL/hr at 08/27/16 2347     Assessment/Plan:  1. Diverticulitis-cont Zosyn.  Advance to clear liquids.  Pain control anmd keep on IV dextrose for now.  If pain intolerable, back off to npo 2. PAC/PVC-Cont metoprolol 12.5  qpm 3. HLD-cont crestor 10 daily    No family + Likely needs 2-3 days more Inpatient   Verneita Griffes, MD  [facility] Pager [phone] 08/28/2016, 10:31 AM    LOS: 1 day

## 2016-08-29 MED ORDER — ROSUVASTATIN CALCIUM 10 MG PO TABS
10.0000 mg | ORAL_TABLET | Freq: Every day | ORAL | Status: DC
  Administered 2016-08-29 – 2016-08-30 (×2): 10 mg via ORAL
  Filled 2016-08-29 (×2): qty 1

## 2016-08-29 NOTE — Progress Notes (Addendum)
Patient complaining of severe anxiety and stating she normally takes her xanax at 6pm. Ordered med she can not get again until 8pm. Patient upset. MD paged for patient to clarify medication order. Patient states she takes it at [place], 12pm, 6pm and 12am.  6:45 PM MD verbal order okay for patient to take it now and she can take on her home schedule

## 2016-08-29 NOTE — Progress Notes (Signed)
Pharmacy Antibiotic Note  Alexa Wilson is a 70 y.o. female admitted on 08/27/2016 with intra-abdominal infection.  Pharmacy has been consulted for Zosyn dosing. Received outpatient Augmentin from 10/24-10/26 . WBC within normal limits, afebrile, renal function stable. Will continue Zosyn for diverticulitis per notes.   Plan: Zosyn 3.375G IV q8h to be infused over 4 hours Trend WBC, temp, renal function    Height: 5\' 6"  (167.6 cm) Weight: 181 lb 7 oz (82.3 kg) IBW/kg (Calculated) : 59.3  Temp (24hrs), Avg:97.9 F (36.6 C), Min:97.8 F (36.6 C), Max:98 F (36.7 C)   Recent Labs Lab 08/27/16 1502 08/28/16 0611  WBC 11.0* 5.6  CREATININE 0.90 0.83    Estimated Creatinine Clearance: 69.2 mL/min (by C-G formula based on SCr of 0.83 mg/dL).    Allergies  Allergen Reactions  . Ciprofloxacin Nausea Only  . Codeine Other (See Comments)    Increases anxiety, Makes me feel all whoozie  . Metronidazole Nausea Only  . Morphine And Related Nausea And Vomiting  . Other Other (See Comments)    All antihistamines cause severe insomnia    Pasty Spillers D PGY1 Pharmacy Resident [phone] 08/29/2016 12:21 PM

## 2016-08-29 NOTE — Discharge Summary (Signed)
Alexa Wilson [id number] DOB: [birth year] DOA: 08/27/2016 PCP: Glenda Chroman, MD  Brief narrative: 64 ? Known h/o palpitations Exercise treadmill poor tolerance Monitor 2014 showed pvc and pac Prior diverticulitis 01/2009, EGD 11/2008 showed normal esophagus Last colonoscopy 01/2011-ext hemorrhoid  Admitted after seeing PCP and having abd pain In Ed found to have diverticulitis   Past medical history-As per Problem list Chart reviewed as below-   Consultants:    Procedures:    Antibiotics:  Zosyn 10/26   Subjective   Doing fair. No nausea no vomiting Tolerating clear liquid diet somewhat had some mild pain yesterday and think she ate too fast    Objective    Interim History:   Telemetry:    Objective: Vitals:   08/28/16 0615 08/28/16 1346 08/28/16 2210 08/29/16 0421  BP: (!) 148/66 (!) 142/78 135/67 125/62  Pulse: 87 82 66 60  Resp: 18 16 20 18   Temp: 98.5 F (36.9 C) 98 F (36.7 C) 98 F (36.7 C) 97.8 F (36.6 C)  TempSrc: Oral Oral Oral Oral  SpO2: 99% 100% 98% 100%  Weight: 82.3 kg (181 lb 7 oz)     Height:        Intake/Output Summary (Last 24 hours) at 08/29/16 1202 Last data filed at 08/29/16 1000  Gross per 24 hour  Intake          2196.67 ml  Output              900 ml  Net          1296.67 ml    Exam:  General: eomi ncat Cardiovascular: s1 s2 no m/r/g Respiratory: clear no added sound Abdomen soft, mildly tender in lower quadrant Skin no le edema Neuro intact  Data Reviewed: Basic Metabolic Panel:  Recent Labs Lab 08/27/16 1502 08/28/16 0611  NA 141 140  K 3.4* 3.4*  CL 106 108  CO2 25 25  GLUCOSE 94 101*  BUN 12 10  CREATININE 0.90 0.83  CALCIUM 9.6 8.6*   Liver Function Tests:  Recent Labs Lab 08/27/16 1502 08/28/16 0611  AST 30 26  ALT 17 16  ALKPHOS 98 80  BILITOT 1.2 1.3*  PROT 7.6 6.0*  ALBUMIN 4.4 3.3*    Recent Labs Lab 08/27/16 1502  LIPASE 32   No results for input(s): AMMONIA in  the last 168 hours. CBC:  Recent Labs Lab 08/27/16 1502 08/28/16 0611  WBC 11.0* 5.6  NEUTROABS  --  2.8  HGB 13.6 11.4*  HCT 42.0 35.4*  MCV 98.4 97.8  PLT 335 267   Cardiac Enzymes: No results for input(s): CKTOTAL, CKMB, CKMBINDEX, TROPONINI in the last 168 hours. BNP: Invalid input(s): POCBNP CBG: No results for input(s): GLUCAP in the last 168 hours.  No results found for this or any previous visit (from the past 240 hour(s)).   Studies:              All Imaging reviewed and is as per above notation   Scheduled Meds: . metoprolol succinate  12.5 mg Oral QPM  . piperacillin-tazobactam (ZOSYN)  IV  3.375 g Intravenous Q8H  . rosuvastatin  10 mg Oral QHS   Continuous Infusions:     Assessment/Plan:  1. Diverticulitis-cont Zosyn.  Advance to clear liquids.  Pain control anmd keep on IV dextrose for now. We will advance to full liquid and see how she does. Repeat labs a.m. 2. PAC/PVC-Cont metoprolol 12.5 qpm 3. HLD-cont crestor 10 daily  D/w patient who didn't want  Likely needs 2-3 days more Inpatient   Verneita Griffes, MD  [facility] Pager [phone] 08/29/2016, 12:02 PM    LOS: 2 days

## 2016-08-30 LAB — CBC WITH DIFFERENTIAL/PLATELET
Basophils Absolute: 0 10*3/uL (ref 0.0–0.1)
Basophils Relative: 1 %
EOS PCT: 2 %
Eosinophils Absolute: 0.1 10*3/uL (ref 0.0–0.7)
HCT: 37.2 % (ref 36.0–46.0)
Hemoglobin: 12.2 g/dL (ref 12.0–15.0)
LYMPHS ABS: 2 10*3/uL (ref 0.7–4.0)
LYMPHS PCT: 44 %
MCH: 31.2 pg (ref 26.0–34.0)
MCHC: 32.8 g/dL (ref 30.0–36.0)
MCV: 95.1 fL (ref 78.0–100.0)
MONO ABS: 0.3 10*3/uL (ref 0.1–1.0)
MONOS PCT: 7 %
Neutro Abs: 2.1 10*3/uL (ref 1.7–7.7)
Neutrophils Relative %: 46 %
PLATELETS: 305 10*3/uL (ref 150–400)
RBC: 3.91 MIL/uL (ref 3.87–5.11)
RDW: 12.9 % (ref 11.5–15.5)
WBC: 4.6 10*3/uL (ref 4.0–10.5)

## 2016-08-30 LAB — CBC
HCT: 37.8 % (ref 36.0–46.0)
HEMOGLOBIN: 12.2 g/dL (ref 12.0–15.0)
MCH: 31 pg (ref 26.0–34.0)
MCHC: 32.3 g/dL (ref 30.0–36.0)
MCV: 96.2 fL (ref 78.0–100.0)
Platelets: 297 10*3/uL (ref 150–400)
RBC: 3.93 MIL/uL (ref 3.87–5.11)
RDW: 13.1 % (ref 11.5–15.5)
WBC: 5.5 10*3/uL (ref 4.0–10.5)

## 2016-08-30 LAB — COMPREHENSIVE METABOLIC PANEL
ALBUMIN: 3.6 g/dL (ref 3.5–5.0)
ALT: 16 U/L (ref 14–54)
ANION GAP: 9 (ref 5–15)
AST: 25 U/L (ref 15–41)
Alkaline Phosphatase: 86 U/L (ref 38–126)
BUN: 6 mg/dL (ref 6–20)
CHLORIDE: 106 mmol/L (ref 101–111)
CO2: 25 mmol/L (ref 22–32)
Calcium: 9.3 mg/dL (ref 8.9–10.3)
Creatinine, Ser: 0.93 mg/dL (ref 0.44–1.00)
GFR calc non Af Amer: >60 mL/min (ref >60)
GLUCOSE: 84 mg/dL (ref 65–99)
Potassium: 3.4 mmol/L — ABNORMAL LOW (ref 3.5–5.1)
SODIUM: 140 mmol/L (ref 135–145)
Total Bilirubin: 0.9 mg/dL (ref 0.3–1.2)
Total Protein: 6.7 g/dL (ref 6.5–8.1)

## 2016-08-30 MED ORDER — AMOXICILLIN-POT CLAVULANATE 875-125 MG PO TABS
1.0000 | ORAL_TABLET | Freq: Two times a day (BID) | ORAL | Status: DC
  Administered 2016-08-30 – 2016-08-31 (×2): 1 via ORAL
  Filled 2016-08-30 (×2): qty 1

## 2016-08-30 NOTE — Progress Notes (Signed)
   Alexa Wilson [id number] DOB: 22-Aug-1946 DOA: 08/27/2016 PCP: Glenda Chroman, MD  Brief narrative:  71 ? Known h/o palpitations Exercise treadmill poor tolerance Monitor 2014 showed pvc and pac Prior diverticulitis 01/2009, EGD 11/2008 showed normal esophagus Last colonoscopy 01/2011-ext hemorrhoid  Admitted after seeing PCP and having abd pain In Ed found to have diverticulitis   Past medical history-As per Problem list Chart reviewed as below-   Consultants:    Procedures:    Antibiotics:  Zosyn 10/26   Subjective   Eating and drinking a little better  no n/v No cp 2 loose stool yesterday    Objective    Interim History:   Telemetry:    Objective: Vitals:   08/29/16 0421 08/29/16 1439 08/29/16 2104 08/30/16 0516  BP: 125/62 132/77 (!) 155/84 132/69  Pulse: 60 71 74 70  Resp: 18 18 18 20   Temp: 97.8 F (36.6 C) 98.6 F (37 C) 98.2 F (36.8 C) 98.4 F (36.9 C)  TempSrc: Oral Oral    SpO2: 100% 100% 97% 99%  Weight:      Height:       No intake or output data in the 24 hours ending 08/30/16 1446  Exam:  General: eomi ncat Cardiovascular: s1 s2 no m/r/g Respiratory: clear no added sound Abdomen soft, mildly tender in lower quadrant Skin no le edema Neuro intact  Data Reviewed: Basic Metabolic Panel:  Recent Labs Lab 08/27/16 1502 08/28/16 0611 08/30/16 0533  NA 141 140 140  K 3.4* 3.4* 3.4*  CL 106 108 106  CO2 25 25 25   GLUCOSE 94 101* 84  BUN 12 10 6   CREATININE 0.90 0.83 0.93  CALCIUM 9.6 8.6* 9.3   Liver Function Tests:  Recent Labs Lab 08/27/16 1502 08/28/16 0611 08/30/16 0533  AST 30 26 25   ALT 17 16 16   ALKPHOS 98 80 86  BILITOT 1.2 1.3* 0.9  PROT 7.6 6.0* 6.7  ALBUMIN 4.4 3.3* 3.6    Recent Labs Lab 08/27/16 1502  LIPASE 32   No results for input(s): AMMONIA in the last 168 hours. CBC:  Recent Labs Lab 08/27/16 1502 08/28/16 0611 08/30/16 0533  WBC 11.0* 5.6 5.5  NEUTROABS  --  2.8  --    HGB 13.6 11.4* 12.2  HCT 42.0 35.4* 37.8  MCV 98.4 97.8 96.2  PLT 335 267 297   Cardiac Enzymes: No results for input(s): CKTOTAL, CKMB, CKMBINDEX, TROPONINI in the last 168 hours. BNP: Invalid input(s): POCBNP CBG: No results for input(s): GLUCAP in the last 168 hours.  No results found for this or any previous visit (from the past 240 hour(s)).   Studies:              All Imaging reviewed and is as per above notation   Scheduled Meds: . metoprolol succinate  12.5 mg Oral QPM  . piperacillin-tazobactam (ZOSYN)  IV  3.375 g Intravenous Q8H  . rosuvastatin  10 mg Oral QHS   Continuous Infusions:     Assessment/Plan:  1. Diverticulitis- Zosyn change ot Po Augmentin.  Advance to soft diet  Repeat labs a.m. Possible d/c soon 2. PAC/PVC-Cont metoprolol 12.5 qpm 3. HLD-cont crestor 10 daily 4. Anxiety-continue Xanax at regular home schedule  Inpatient Possible d/c am if all better  Verneita Griffes, MD  [facility] Pager [phone] 08/30/2016, 2:46 PM    LOS: 3 days

## 2016-08-31 MED ORDER — AMOXICILLIN-POT CLAVULANATE 875-125 MG PO TABS: 1.0000 | ORAL_TABLET | Freq: Two times a day (BID) | ORAL | 0 refills | Status: DC

## 2016-08-31 NOTE — Progress Notes (Signed)
Alexa Wilson to be D/C'd to home per MD order.  Discussed with the patient and all questions fully answered.  VSS, Skin clean, dry and intact without evidence of skin break down, no evidence of skin tears noted. IV catheter discontinued intact. Site without signs and symptoms of complications. Dressing and pressure applied.  An After Visit Summary was printed and given to the patient. Patient received prescription.  D/c education completed with patient/family including follow up instructions, medication list, d/c activities limitations if indicated, with other d/c instructions as indicated by MD - patient able to verbalize understanding, all questions fully answered.   Patient instructed to return to ED, call 911, or call MD for any changes in condition.   Patient escorted via [place], and D/C home via private auto.  Alexa Wilson 08/31/2016 4:21 PM

## 2016-08-31 NOTE — Care Management Important Message (Signed)
Important Message  Patient Details  Name: Alexa Wilson MRN: [id number] Date of Birth: 1946/08/18   Medicare Important Message Given:  Yes    Chastity Noland 08/31/2016, 5:20 PM

## 2016-08-31 NOTE — Discharge Summary (Signed)
Physician Discharge Summary  Alexa Wilson [id number] DOB: 1945-12-26 DOA: 08/27/2016  PCP: Glenda Chroman, MD  Admit date: 08/27/2016 Discharge date: 08/31/2016  Time spent: 25 minutes  Recommendations for Outpatient Follow-up:  1. Complete augmentin for diverticulitis as per MAr 2. No other overall changes to meds  Discharge Diagnoses:  Principal Problem:   Sigmoid diverticulitis Active Problems:   Hyperlipidemia   Discharge Condition: fair  Diet recommendation: soft, hh  Filed Weights   08/27/16 1447 08/27/16 2320 08/28/16 0615  Weight: 82.6 kg (182 lb) 82.5 kg (181 lb 12.8 oz) 82.3 kg (181 lb 7 oz)    History of present illness:  34 ? Known h/o palpitations Exercise treadmill poor tolerance Monitor 2014 showed pvc and pac Prior diverticulitis 01/2009, EGD 11/2008 showed normal esophagus Last colonoscopy 01/2011-ext hemorrhoid  Admitted after seeing PCP and having abd pain In Ed found to have diverticulitis  Hospital Course:   1. Diverticulitis- Zosyn initially used and changed to Po Augmentin.  continue soft diet as OP for 5 days. 2. PAC/PVC-Cont metoprolol 12.5 qpm 3. HLD-cont crestor 10 daily 4. Anxiety-continue Xanax at regular home schedule    Discharge Exam: Vitals:   08/31/16 0626 08/31/16 0948  BP: 119/63 132/77  Pulse: (!) 56 77  Resp: 16 12  Temp: 98 F (36.7 C) 98 F (36.7 C)    General: eomi ncat, eating fair Pain much less Cardiovascular: s1 s2 no m/r/g Respiratory: clear no added sound Abd soft nt nd no rebound no gaurd  Discharge Instructions   Discharge Instructions    Diet - low sodium heart healthy    Complete by:  As directed    Discharge instructions    Complete by:  As directed    Finish all the antibiotics Take over the counter tylenol for stomach pains [place] diet for 1 week and follow with [facility] internal medicine in 1-2 weeks   Increase activity slowly    Complete by:  As directed      Current Discharge  Medication List    START taking these medications   Details  amoxicillin-clavulanate (AUGMENTIN) 875-125 MG tablet Take 1 tablet by mouth every 12 (twelve) hours. Qty: 20 tablet, Refills: 0      CONTINUE these medications which have NOT CHANGED   Details  alprazolam (XANAX) 2 MG tablet Take 1 mg by mouth every 6 (six) hours. Scheduled doses May take half tablet    aspirin 81 MG chewable tablet Chew 81 mg by mouth daily as needed.    metoprolol succinate (TOPROL-XL) 25 MG 24 hr tablet Take 1 tablet (25 mg total) by mouth daily. Qty: 90 tablet, Refills: 3   Associated Diagnoses: Palpitations; Hyperlipidemia    ondansetron (ZOFRAN) 4 MG tablet Take 1 tablet (4 mg total) by mouth every 8 (eight) hours as needed for nausea or vomiting. Qty: 15 tablet, Refills: 0    rosuvastatin (CRESTOR) 10 MG tablet Take 10 mg by mouth daily.    cholecalciferol (VITAMIN D) 1000 UNITS tablet Take 2,000 Units by mouth every morning.     docusate sodium 100 MG CAPS Take 100 mg by mouth daily. Qty: 10 capsule, Refills: 0      STOP taking these medications     amoxicillin-clavulanate (AUGMENTIN) 500-125 MG tablet        Allergies  Allergen Reactions  . Ciprofloxacin Nausea Only  . Codeine Other (See Comments)    Increases anxiety, Makes me feel all whoozie  . Metronidazole Nausea Only  .  Morphine And Related Nausea And Vomiting  . Other Other (See Comments)    All antihistamines cause severe insomnia      The results of significant diagnostics from this hospitalization (including imaging, microbiology, ancillary and laboratory) are listed below for reference.    Significant Diagnostic Studies: Ct Abdomen Pelvis W Contrast  Result Date: 08/27/2016 CLINICAL DATA:  Abdominal pain for 1 week. EXAM: CT ABDOMEN AND PELVIS WITH CONTRAST TECHNIQUE: Multidetector CT imaging of the abdomen and pelvis was performed using the standard protocol following bolus administration of intravenous contrast.  CONTRAST:  166mL ISOVUE-300 IOPAMIDOL (ISOVUE-300) INJECTION 61% COMPARISON:  06/27/2015; radiographs from 08/06/2016 FINDINGS: Lower chest: Subsegmental atelectasis or scar in the left lower lobe. Small type 1 hiatal hernia. Hepatobiliary: Minimal intrahepatic biliary dilatation. Common bile duct about 6 mm in diameter. Gallbladder not seen, presumed surgically absent. I do not see a mass along the porta hepatis, the liver appears otherwise unremarkable. Pancreas: Unremarkable Spleen: Unremarkable Adrenals/Urinary Tract: Mixed injection with portal venous phase contrast in also excreted contrast in the renal collecting systems. Contrast medium seen in the urinary bladder. There is no filling defect along the urothelium. Adrenal glands normal. Stomach/Bowel: Moderate to prominent sigmoid diverticulitis involving the proximal sigmoid colon, with multiple inflamed diverticula in the region of involvement. I do not see any definite extraluminal gas or discrete abscess, several enhancing diverticula and one gas filled diverticulum in the region of involvement. Vascular/Lymphatic: Aortoiliac atherosclerotic vascular disease. No pathologic adenopathy identified. Reproductive: Unremarkable Other: No supplemental non-categorized findings. Musculoskeletal: Levoconvex lumbar scoliosis. Suspected left foraminal stenosis due to a left lateral recess and foraminal disc protrusion at L5-S1. IMPRESSION: 1. Moderate to prominent proximal sigmoid colon diverticulitis with considerable inflammatory stranding in the region, but no extraluminal gas or abscess. 2. Left foraminal impingement at L5-S1 due to a disc protrusion. Levoconvex lumbar scoliosis. 3. Minimal intrahepatic biliary dilatation, probably incidental. 4.  Aortoiliac atherosclerotic vascular disease. 5. Small type 1 hiatal hernia. Electronically Signed   By: Van Clines M.D.   On: 08/27/2016 20:31    Microbiology: No results found for this or any previous visit  (from the past 240 hour(s)).   Labs: Basic Metabolic Panel:  Recent Labs Lab 08/27/16 1502 08/28/16 0611 08/30/16 0533  NA 141 140 140  K 3.4* 3.4* 3.4*  CL 106 108 106  CO2 25 25 25   GLUCOSE 94 101* 84  BUN 12 10 6   CREATININE 0.90 0.83 0.93  CALCIUM 9.6 8.6* 9.3   Liver Function Tests:  Recent Labs Lab 08/27/16 1502 08/28/16 0611 08/30/16 0533  AST 30 26 25   ALT 17 16 16   ALKPHOS 98 80 86  BILITOT 1.2 1.3* 0.9  PROT 7.6 6.0* 6.7  ALBUMIN 4.4 3.3* 3.6    Recent Labs Lab 08/27/16 1502  LIPASE 32   No results for input(s): AMMONIA in the last 168 hours. CBC:  Recent Labs Lab 08/27/16 1502 08/28/16 0611 08/30/16 0533 08/30/16 1503  WBC 11.0* 5.6 5.5 4.6  NEUTROABS  --  2.8  --  2.1  HGB 13.6 11.4* 12.2 12.2  HCT 42.0 35.4* 37.8 37.2  MCV 98.4 97.8 96.2 95.1  PLT 335 267 297 305   Cardiac Enzymes: No results for input(s): CKTOTAL, CKMB, CKMBINDEX, TROPONINI in the last 168 hours. BNP: BNP (last 3 results) No results for input(s): BNP in the last 8760 hours.  ProBNP (last 3 results) No results for input(s): PROBNP in the last 8760 hours.  CBG: No results for input(s): GLUCAP  in the last 168 hours.     SignedNita Sells MD   [facility] 08/31/2016, 1:00 PM

## 2016-09-07 DIAGNOSIS — E559 Vitamin D deficiency, unspecified: Secondary | ICD-10-CM | POA: Diagnosis not present

## 2016-09-07 DIAGNOSIS — Z09 Encounter for follow-up examination after completed treatment for conditions other than malignant neoplasm: Secondary | ICD-10-CM | POA: Diagnosis not present

## 2016-09-07 DIAGNOSIS — F419 Anxiety disorder, unspecified: Secondary | ICD-10-CM | POA: Diagnosis not present

## 2016-09-07 DIAGNOSIS — Z299 Encounter for prophylactic measures, unspecified: Secondary | ICD-10-CM | POA: Diagnosis not present

## 2016-09-07 DIAGNOSIS — K5792 Diverticulitis of intestine, part unspecified, without perforation or abscess without bleeding: Secondary | ICD-10-CM | POA: Diagnosis not present

## 2016-09-22 ENCOUNTER — Emergency Department (HOSPITAL_COMMUNITY): Payer: Medicare Other

## 2016-09-22 ENCOUNTER — Inpatient Hospital Stay (HOSPITAL_COMMUNITY)
Admission: EM | Admit: 2016-09-22 | Discharge: 2016-09-27 | DRG: 392 | Disposition: A | Discharge status: Home / Self Care | Payer: Medicare Other | Attending: Internal Medicine | Admitting: Internal Medicine

## 2016-09-22 ENCOUNTER — Encounter (HOSPITAL_COMMUNITY): Payer: Self-pay

## 2016-09-22 DIAGNOSIS — Z86010 Personal history of colonic polyps: Secondary | ICD-10-CM | POA: Diagnosis not present

## 2016-09-22 DIAGNOSIS — R110 Nausea: Secondary | ICD-10-CM | POA: Diagnosis not present

## 2016-09-22 DIAGNOSIS — K219 Gastro-esophageal reflux disease without esophagitis: Secondary | ICD-10-CM | POA: Diagnosis present

## 2016-09-22 DIAGNOSIS — K5792 Diverticulitis of intestine, part unspecified, without perforation or abscess without bleeding: Secondary | ICD-10-CM | POA: Diagnosis present

## 2016-09-22 DIAGNOSIS — E876 Hypokalemia: Secondary | ICD-10-CM | POA: Diagnosis present

## 2016-09-22 DIAGNOSIS — K5900 Constipation, unspecified: Secondary | ICD-10-CM | POA: Diagnosis present

## 2016-09-22 DIAGNOSIS — R51 Headache: Secondary | ICD-10-CM | POA: Diagnosis present

## 2016-09-22 DIAGNOSIS — F410 Panic disorder [episodic paroxysmal anxiety] without agoraphobia: Secondary | ICD-10-CM | POA: Diagnosis present

## 2016-09-22 DIAGNOSIS — I10 Essential (primary) hypertension: Secondary | ICD-10-CM | POA: Diagnosis present

## 2016-09-22 DIAGNOSIS — E785 Hyperlipidemia, unspecified: Secondary | ICD-10-CM | POA: Diagnosis not present

## 2016-09-22 DIAGNOSIS — Z833 Family history of diabetes mellitus: Secondary | ICD-10-CM

## 2016-09-22 DIAGNOSIS — R1032 Left lower quadrant pain: Secondary | ICD-10-CM | POA: Diagnosis not present

## 2016-09-22 DIAGNOSIS — D649 Anemia, unspecified: Secondary | ICD-10-CM | POA: Diagnosis present

## 2016-09-22 DIAGNOSIS — K5732 Diverticulitis of large intestine without perforation or abscess without bleeding: Secondary | ICD-10-CM | POA: Diagnosis not present

## 2016-09-22 DIAGNOSIS — Z8249 Family history of ischemic heart disease and other diseases of the circulatory system: Secondary | ICD-10-CM

## 2016-09-22 DIAGNOSIS — R002 Palpitations: Secondary | ICD-10-CM

## 2016-09-22 HISTORY — DX: Agoraphobia with panic disorder: F40.01

## 2016-09-22 HISTORY — DX: Gastro-esophageal reflux disease without esophagitis: K21.9

## 2016-09-22 LAB — COMPREHENSIVE METABOLIC PANEL
ALBUMIN: 4.3 g/dL (ref 3.5–5.0)
ALT: 19 U/L (ref 14–54)
AST: 27 U/L (ref 15–41)
Alkaline Phosphatase: 86 U/L (ref 38–126)
Anion gap: 9 (ref 5–15)
BILIRUBIN TOTAL: 0.8 mg/dL (ref 0.3–1.2)
BUN: 9 mg/dL (ref 6–20)
CHLORIDE: 108 mmol/L (ref 101–111)
CO2: 24 mmol/L (ref 22–32)
CREATININE: 0.84 mg/dL (ref 0.44–1.00)
Calcium: 9.4 mg/dL (ref 8.9–10.3)
GFR calc Af Amer: >60 mL/min (ref >60)
GLUCOSE: 100 mg/dL — AB (ref 65–99)
POTASSIUM: 3.3 mmol/L — AB (ref 3.5–5.1)
Sodium: 141 mmol/L (ref 135–145)
TOTAL PROTEIN: 7.1 g/dL (ref 6.5–8.1)

## 2016-09-22 LAB — CBC
HEMATOCRIT: 40 % (ref 36.0–46.0)
Hemoglobin: 13 g/dL (ref 12.0–15.0)
MCH: 31.1 pg (ref 26.0–34.0)
MCHC: 32.5 g/dL (ref 30.0–36.0)
MCV: 95.7 fL (ref 78.0–100.0)
PLATELETS: 296 10*3/uL (ref 150–400)
RBC: 4.18 MIL/uL (ref 3.87–5.11)
RDW: 13.2 % (ref 11.5–15.5)
WBC: 10.1 10*3/uL (ref 4.0–10.5)

## 2016-09-22 LAB — LIPASE, BLOOD: Lipase: 40 U/L (ref 11–51)

## 2016-09-22 LAB — URINALYSIS, ROUTINE W REFLEX MICROSCOPIC
Bilirubin Urine: NEGATIVE
Glucose, UA: NEGATIVE mg/dL
HGB URINE DIPSTICK: NEGATIVE
Ketones, ur: NEGATIVE mg/dL
LEUKOCYTES UA: NEGATIVE
NITRITE: NEGATIVE
Protein, ur: NEGATIVE mg/dL
SPECIFIC GRAVITY, URINE: 1.012 (ref 1.005–1.030)
pH: 5 (ref 5.0–8.0)

## 2016-09-22 MED ORDER — IOPAMIDOL (ISOVUE-300) INJECTION 61%
INTRAVENOUS | Status: AC
  Administered 2016-09-22: 100 mL
  Filled 2016-09-22: qty 100

## 2016-09-22 MED ORDER — FENTANYL CITRATE (PF) 100 MCG/2ML IJ SOLN
50.0000 ug | Freq: Once | INTRAMUSCULAR | Status: AC
  Administered 2016-09-22: 50 ug via INTRAVENOUS
  Filled 2016-09-22: qty 2

## 2016-09-22 MED ORDER — CIPROFLOXACIN IN D5W 400 MG/200ML IV SOLN
400.0000 mg | Freq: Two times a day (BID) | INTRAVENOUS | Status: DC
  Administered 2016-09-22 – 2016-09-26 (×8): 400 mg via INTRAVENOUS
  Filled 2016-09-22 (×11): qty 200

## 2016-09-22 MED ORDER — HYDRALAZINE HCL 20 MG/ML IJ SOLN: 10.0000 mg | Freq: Three times a day (TID) | INTRAMUSCULAR | Status: DC | PRN

## 2016-09-22 MED ORDER — METOPROLOL SUCCINATE ER 25 MG PO TB24
12.5000 mg | ORAL_TABLET | Freq: Every evening | ORAL | Status: DC
  Administered 2016-09-22 – 2016-09-26 (×5): 12.5 mg via ORAL
  Filled 2016-09-22 (×6): qty 1

## 2016-09-22 MED ORDER — KETOROLAC TROMETHAMINE 15 MG/ML IJ SOLN
15.0000 mg | Freq: Three times a day (TID) | INTRAMUSCULAR | Status: DC | PRN
  Administered 2016-09-22 – 2016-09-23 (×3): 15 mg via INTRAVENOUS
  Filled 2016-09-22 (×3): qty 1

## 2016-09-22 MED ORDER — MEROPENEM 500 MG IV SOLR
500.0000 mg | Freq: Three times a day (TID) | INTRAVENOUS | Status: DC
  Filled 2016-09-22 (×2): qty 0.5

## 2016-09-22 MED ORDER — ONDANSETRON HCL 4 MG/2ML IJ SOLN
4.0000 mg | Freq: Four times a day (QID) | INTRAMUSCULAR | Status: DC | PRN
  Administered 2016-09-25 – 2016-09-26 (×2): 4 mg via INTRAVENOUS
  Filled 2016-09-22 (×2): qty 2

## 2016-09-22 MED ORDER — SODIUM CHLORIDE 0.9 % IV SOLN: INTRAVENOUS | Status: DC

## 2016-09-22 MED ORDER — ENOXAPARIN SODIUM 40 MG/0.4ML ~~LOC~~ SOLN
40.0000 mg | SUBCUTANEOUS | Status: DC
  Administered 2016-09-22 – 2016-09-26 (×5): 40 mg via SUBCUTANEOUS
  Filled 2016-09-22 (×5): qty 0.4

## 2016-09-22 MED ORDER — SODIUM CHLORIDE 0.9 % IV BOLUS (SEPSIS)
1000.0000 mL | Freq: Once | INTRAVENOUS | Status: AC
  Administered 2016-09-22: 1000 mL via INTRAVENOUS

## 2016-09-22 MED ORDER — PIPERACILLIN-TAZOBACTAM 3.375 G IVPB
3.3750 g | Freq: Once | INTRAVENOUS | Status: AC
  Administered 2016-09-22: 3.375 g via INTRAVENOUS
  Filled 2016-09-22: qty 50

## 2016-09-22 MED ORDER — ONDANSETRON HCL 4 MG/2ML IJ SOLN
4.0000 mg | Freq: Once | INTRAMUSCULAR | Status: AC
  Administered 2016-09-22: 4 mg via INTRAVENOUS
  Filled 2016-09-22: qty 2

## 2016-09-22 MED ORDER — POTASSIUM CHLORIDE CRYS ER 20 MEQ PO TBCR
40.0000 meq | EXTENDED_RELEASE_TABLET | Freq: Once | ORAL | Status: AC
  Administered 2016-09-22: 40 meq via ORAL
  Filled 2016-09-22: qty 2

## 2016-09-22 MED ORDER — ONDANSETRON HCL 4 MG/2ML IJ SOLN
4.0000 mg | Freq: Two times a day (BID) | INTRAMUSCULAR | Status: DC
  Administered 2016-09-22 – 2016-09-26 (×9): 4 mg via INTRAVENOUS
  Filled 2016-09-22 (×8): qty 2

## 2016-09-22 MED ORDER — FENTANYL CITRATE (PF) 100 MCG/2ML IJ SOLN
25.0000 ug | Freq: Once | INTRAMUSCULAR | Status: AC
  Administered 2016-09-22: 25 ug via INTRAVENOUS
  Filled 2016-09-22: qty 2

## 2016-09-22 MED ORDER — ALPRAZOLAM 0.5 MG PO TABS
1.0000 mg | ORAL_TABLET | Freq: Four times a day (QID) | ORAL | Status: DC
  Administered 2016-09-23 – 2016-09-27 (×19): 1 mg via ORAL
  Filled 2016-09-22 (×21): qty 2

## 2016-09-22 MED ORDER — POTASSIUM CHLORIDE IN NACL 20-0.9 MEQ/L-% IV SOLN
INTRAVENOUS | Status: DC
  Administered 2016-09-22 – 2016-09-24 (×5): via INTRAVENOUS
  Administered 2016-09-25: 1 mL via INTRAVENOUS
  Administered 2016-09-25 – 2016-09-26 (×2): via INTRAVENOUS
  Filled 2016-09-22 (×9): qty 1000

## 2016-09-22 MED ORDER — MORPHINE SULFATE (PF) 4 MG/ML IV SOLN: 1.0000 mg | INTRAVENOUS | Status: DC | PRN

## 2016-09-22 MED ORDER — METRONIDAZOLE IN NACL 5-0.79 MG/ML-% IV SOLN
500.0000 mg | Freq: Three times a day (TID) | INTRAVENOUS | Status: DC
  Administered 2016-09-22 – 2016-09-25 (×8): 500 mg via INTRAVENOUS
  Filled 2016-09-22 (×12): qty 100

## 2016-09-22 NOTE — ED Notes (Signed)
PA at bedside.

## 2016-09-22 NOTE — ED Notes (Signed)
RN attempted report 

## 2016-09-22 NOTE — ED Notes (Signed)
Patient resting at this does not appear to be in any distress.

## 2016-09-22 NOTE — ED Notes (Signed)
PA at bedside,  

## 2016-09-22 NOTE — H&P (Signed)
History and Physical    Alexa Wilson UJW:[id number] DOB: 1946-06-11 DOA: 09/22/2016   PCP: Ignatius Specking, MD   Patient coming from/Resides with: Private residence/lives alone  Admission status: Inpatient/medical floor -medically necessary to stay a minimum 2 midnights to rule out impending and/or unexpected changes in physiologic status that may differ from initial evaluation performed in the ER and/or at time of admission. Patient admitted with recurrence of diverticulitis with significant abdominal pain. She will require NPO and bowel rest initially with IV fluids for hydration and IV antibiotics. Pending response to these treatments she may require repeat CT scan to evaluate for evolving abscess.  Chief Complaint: Left lower quadrant abdominal pain  HPI: Alexa Wilson is a 70 y.o. female with medical history significant for dyslipidemia, colonic polyps with last colonoscopy about 5 years ago, GERD, constipation panic disorder. Patient was recently hospitalized for acute diverticulitis without abscess and was discharged on 10/30 on Augmentin after being treated in the hospital with Zosyn. She reports that within a week of being discharge her pain had resolved and she felt like she was completely better. By this past Saturday the pain had returned. She had one episode of loose stools on Sunday without blood. She did have an episode of constipation since discharge. She has had no fevers or chills. She's had significant left lower quadrant abdominal pain radiating into the back, down the left leg, and into the vagina.  ED Course:  Vital Signs: BP 130/65   Pulse 69   Temp 97.8 F (36.6 C) (Oral)   Resp 16   Ht 5\' 6"  (1.676 m)   Wt 82.6 kg (182 lb)   SpO2 99%   BMI 29.38 kg/m  CT abdomen and pelvis with contrast: Previous inflammation about the proximal sigmoid colon has regressed compared to CT on 10/26 but there is increased mesenteric inflammation about the distal sigmoid colon and a  new small bilateral layering pelvic free fluid without a drainable fluid collection, abscess or other complicating features. Lab data: Sodium 141, potassium 3.3, BUN 9, creatinine 0.84, glucose 100, LFTs normal, white count 10,100, differential not obtained, hemoglobin 13, platelets 296,000, urinalysis unremarkable Medications and treatments: Normal saline IV fluids, Zofran 4 mg IV 1, fentanyl 50 g IV 1, Zosyn 3.375 g IV 1, potassium 40 mEq by mouth 1, fentanyl 25 g IV 1  Review of Systems:  In addition to the HPI above,  No Fever-chills, myalgias or other constitutional symptoms No Headache, changes with Vision or hearing, new weakness, tingling, numbness in any extremity, dizziness, dysarthria or word finding difficulty, gait disturbance or imbalance, tremors or seizure activity No problems swallowing food or Liquids, indigestion/reflux, choking or coughing while eating No Chest pain, Cough or Shortness of Breath, palpitations, orthopnea or DOE No N/V, melena,hematochezia, dark tarry stools No dysuria, malodorous urine, hematuria or flank pain No new skin rashes, lesions, masses or bruises, No new joint pains, aches, swelling or redness No recent unintentional weight gain or loss No polyuria, polydypsia or polyphagia   Past Medical History:  Diagnosis Date  . Brown recluse spider bite   . Constipation   . Diverticulitis   . Hyperlipidemia   . Lyme disease   . Migraines   . Panic disorder   . Shingles     Past Surgical History:  Procedure Laterality Date  . APPENDECTOMY    . CHOLECYSTECTOMY    . COLONOSCOPY  2012   RMR: 1. External hemorrhoids, otherwise normal rectum. 2. Left-sided diverticula  status post snare polypectomy left colon polyp. Remainder of the colonic mucosa and terminal ileum mucoa appeared normal.   . TONSILLECTOMY      Social History   Social History  . Marital status: Widowed    Spouse name: N/A  . Number of children: 5  . Years of education:  N/A   Occupational History  . Not on file.   Social History Main Topics  . Smoking status: Never Smoker  . Smokeless tobacco: Never Used  . Alcohol use No  . Drug use: No  . Sexual activity: Not on file   Other Topics Concern  . Not on file   Social History Narrative  . No narrative on file    Mobility: Without assistive devices Work history: Not obtained   Allergies  Allergen Reactions  . Ciprofloxacin Nausea Only  . Codeine Other (See Comments)    Increases anxiety, Makes me feel all whoozie  . Metronidazole Nausea Only  . Morphine And Related Nausea And Vomiting  . Other Other (See Comments)    All antihistamines cause severe insomnia    Family History  Problem Relation Age of Onset  . Diabetes Mother   . Hypertension Mother   . Heart attack Mother   . Cancer Father     Melanoma   . Diabetes Brother   . Diabetes Son      Prior to Admission medications   Medication Sig Start Date End Date Taking? Authorizing Provider  alprazolam Prudy Feeler) 2 MG tablet Take 1 mg by mouth every 6 (six) hours. Scheduled doses May take half tablet   Yes Historical Provider, MD  metoprolol succinate (TOPROL-XL) 25 MG 24 hr tablet Take 1 tablet (25 mg total) by mouth daily. Patient taking differently: Take 12.5 mg by mouth every evening.  11/15/15  Yes Lewayne Bunting, MD  rosuvastatin (CRESTOR) 10 MG tablet Take 10 mg by mouth daily. 10/23/15  Yes Historical Provider, MD  amoxicillin-clavulanate (AUGMENTIN) 875-125 MG tablet Take 1 tablet by mouth every 12 (twelve) hours. Patient not taking: Reported on 09/22/2016 08/31/16   Rhetta Mura, MD  aspirin 81 MG chewable tablet Chew 81 mg by mouth daily as needed.    Historical Provider, MD  cholecalciferol (VITAMIN D) 1000 UNITS tablet Take 2,000 Units by mouth every morning.     Historical Provider, MD  docusate sodium 100 MG CAPS Take 100 mg by mouth daily. Patient not taking: Reported on 09/22/2016 07/16/13   Henderson Cloud, MD  ondansetron (ZOFRAN) 4 MG tablet Take 1 tablet (4 mg total) by mouth every 8 (eight) hours as needed for nausea or vomiting. Patient not taking: Reported on 09/22/2016 02/19/14   Trixie Dredge, PA-C    Physical Exam: Vitals:   09/22/16 1445 09/22/16 1500 09/22/16 1545 09/22/16 1600  BP: 127/68 136/70 125/63 130/65  Pulse: 73 74 71 69  Resp:      Temp:      TempSrc:      SpO2: 99% 100% 100% 99%  Weight:      Height:          Constitutional: NAD, Somewhat anxious, uncomfortable with mobilization Eyes: PERRL, lids and conjunctivae normal ENMT: Mucous membranes are dry. Posterior pharynx clear of any exudate or lesions.Normal dentition.  Neck: normal, supple, no masses, no thyromegaly Respiratory: clear to auscultation bilaterally, no wheezing, no crackles. Normal respiratory effort. No accessory muscle use.  Cardiovascular: Regular rate and rhythm, no murmurs / rubs / gallops. No extremity edema.  2+ pedal pulses. No carotid bruits.  Abdomen: Tender with guarding but no rebounding left upper quadrant, no masses palpated. No hepatosplenomegaly. Bowel sounds positive.  Musculoskeletal: no clubbing / cyanosis. No joint deformity upper and lower extremities. Good ROM, no contractures. Normal muscle tone.  Skin: no rashes, lesions, ulcers. No induration Neurologic: CN 2-12 grossly intact. Sensation intact, DTR normal. Strength 5/5 x all 4 extremities.  Psychiatric: Normal judgment and insight. Alert and oriented x 3. Normal mood.    Labs on Admission: I have personally reviewed following labs and imaging studies  CBC:  Recent Labs Lab 09/22/16 0944  WBC 10.1  HGB 13.0  HCT 40.0  MCV 95.7  PLT 296   Basic Metabolic Panel:  Recent Labs Lab 09/22/16 0944  NA 141  K 3.3*  CL 108  CO2 24  GLUCOSE 100*  BUN 9  CREATININE 0.84  CALCIUM 9.4   GFR: Estimated Creatinine Clearance: 68.5 mL/min (by C-G formula based on SCr of 0.84 mg/dL). Liver Function  Tests:  Recent Labs Lab 09/22/16 0944  AST 27  ALT 19  ALKPHOS 86  BILITOT 0.8  PROT 7.1  ALBUMIN 4.3    Recent Labs Lab 09/22/16 0944  LIPASE 40   No results for input(s): AMMONIA in the last 168 hours. Coagulation Profile: No results for input(s): INR, PROTIME in the last 168 hours. Cardiac Enzymes: No results for input(s): CKTOTAL, CKMB, CKMBINDEX, TROPONINI in the last 168 hours. BNP (last 3 results) No results for input(s): PROBNP in the last 8760 hours. HbA1C: No results for input(s): HGBA1C in the last 72 hours. CBG: No results for input(s): GLUCAP in the last 168 hours. Lipid Profile: No results for input(s): CHOL, HDL, LDLCALC, TRIG, CHOLHDL, LDLDIRECT in the last 72 hours. Thyroid Function Tests: No results for input(s): TSH, T4TOTAL, FREET4, T3FREE, THYROIDAB in the last 72 hours. Anemia Panel: No results for input(s): VITAMINB12, FOLATE, FERRITIN, TIBC, IRON, RETICCTPCT in the last 72 hours. Urine analysis:    Component Value Date/Time   COLORURINE YELLOW 09/22/2016 1003   APPEARANCEUR CLEAR 09/22/2016 1003   LABSPEC 1.012 09/22/2016 1003   PHURINE 5.0 09/22/2016 1003   GLUCOSEU NEGATIVE 09/22/2016 1003   HGBUR NEGATIVE 09/22/2016 1003   BILIRUBINUR NEGATIVE 09/22/2016 1003   KETONESUR NEGATIVE 09/22/2016 1003   PROTEINUR NEGATIVE 09/22/2016 1003   UROBILINOGEN 0.2 02/19/2014 0850   NITRITE NEGATIVE 09/22/2016 1003   LEUKOCYTESUR NEGATIVE 09/22/2016 1003   Sepsis Labs: @LABRCNTIP (procalcitonin:4,lacticidven:4) )No results found for this or any previous visit (from the past 240 hour(s)).   Radiological Exams on Admission: Ct Abdomen Pelvis W Contrast  Result Date: 09/22/2016 CLINICAL DATA:  70 year old female with lower abdominal pain, low back pain with nausea for 4 days. Constipation and diarrhea. Recent diverticulitis. Initial encounter. EXAM: CT ABDOMEN AND PELVIS WITH CONTRAST TECHNIQUE: Multidetector CT imaging of the abdomen and pelvis was  performed using the standard protocol following bolus administration of intravenous contrast. CONTRAST:  ISOVUE-300 IOPAMIDOL (ISOVUE-300) INJECTION 61% COMPARISON:  CT Abdomen and Pelvis 08/27/2016 and earlier. FINDINGS: Lower chest: Stable lung bases.  No pericardial or pleural effusion. No upper abdominal free air. Hepatobiliary: Borderline to mild hepatic steatosis. Gallbladder appears to be surgically absent as before. Mild intrahepatic biliary ductal prominence is stable. Pancreas: Negative. Spleen: Negative. Adrenals/Urinary Tract: Negative adrenal glands. Bilateral renal enhancement and contrast excretion is normal. Unremarkable urinary bladder. Stomach/Bowel: Small volume of free fluid now anterior to the rectum which otherwise remains normal. Severe diverticulosis throughout the sigmoid colon  with continued inflammation including a indistinct appearance of the sigmoid wall throughout. Mesenteric stranding along the distal sigmoid colon has increased on series 21, image 65. At the same time mesenteric stranding previously-seen at the junction of the descending and sigmoid colon has regressed. No extraluminal gas. Diverticulosis throughout the left colon without definite active inflammation. Negative transverse colon. Decompressed right colon. Negative terminal ileum. Diminutive or absent appendix. No dilated small bowel. Small gastric hiatal hernia is unchanged. Decompressed stomach and duodenum with a small volume of oral contrast. Vascular/Lymphatic: Calcified aortic atherosclerosis. Major arterial structures are patent. Portal venous system is patent. No lymphadenopathy. Reproductive: Negative. Other: Small volume pelvic free fluid is new (series 21, image 70). No organized or drainable pelvic fluid collection. Musculoskeletal: Stable, including levoconvex lumbar scoliosis. IMPRESSION: 1. Unresolved sigmoid diverticulitis since October. There is increased mesenteric inflammation about the distal  sigmoid colon and new small volume of layering pelvic free fluid, however, inflammation about the proximal sigmoid colon has regressed since 08/27/2016. No abscess, drainable fluid collection, or other complicating features. 2. Otherwise stable abdomen and pelvis. Electronically Signed   By: Odessa Fleming M.D.   On: 09/22/2016 15:40     Assessment/Plan Principal Problem:   Diverticulitis -Recurrence and suspected failed treatment with Zosyn and Augmentin -Begin meropenem IV -Bowel rest until pain resolved -IV fluids at 100 mL per hour -Symptom management with IV morphine for pain and IV Zofran for nausea -If increased pain or pain not improved, fever or leukocytosis may need repeat CT abdomen and pelvis to ensure that previously seen pelvic free fluid has not evolved into abscess -Recommend lower residue diet upon discharge -Once diverticulitis resolved (no sooner than 4-6 weeks) needs to be evaluated by her gastroenterologist for anticipated colonoscopy-last colonoscopy about 5 years ago; known polyps -No indication at this juncture to consult surgery since no perforation or abscess  Active Problems:   Acute hypokalemia -Was given oral potassium in the ER -NPO so we'll utilize potassium and maintenance fluids -Follow chemistries    Hyperlipidemia -Hold preadmission Crestor    HTN -Continue low-dose Toprol    Anxiety disorder and panic attacks -Continue preadmission Xanax      DVT prophylaxis: Lovenox Code Status: Full Family Communication: No family at the bedside  Disposition Plan: Anticipate discharge back to preadmission however, once medically stable Consults called: None    Abbegail Matuska L. ANP-BC [facility] Pager [phone]   If 7PM-7AM, please contact night-coverage [email or web address] Password [facility]  09/22/2016, 4:38 PM

## 2016-09-22 NOTE — ED Notes (Signed)
Admitting at bedside,  

## 2016-09-22 NOTE — ED Provider Notes (Signed)
[facility] DEPT Provider Note   CSN: [id number] Arrival date & time: 09/22/16  0919     History   Chief Complaint Chief Complaint  Patient presents with  . Abdominal Pain    HPI MODIE WESS is a 70 y.o. female.  HPI   Patient is a 70 year old female with history of diverticulitis, hyperlipidemia, appendectomy and cholecystectomy presents the ED with complaint of abdominal pain, onset yesterday morning. Patient reports having constant intense cramping pain to her lower abdomen which she notes is worse on the left. She states her pain is worse when sitting up or bending forward. Denies any alleviating factors. Denies radiation. She also reports having severe throbbing pain to bilateral hips and lower back that started yesterday. Endorses associated nausea. She notes that her abdominal pain and back pain are consistent with pain she has had in the past related to diverticulitis.  She notes she was recently admitted to the hospital for diverticulitis and was d/c home on 08/31/16 with improvement of her sxs. Patient also notes she was constipated on Sunday and then had multiple episodes of nonbloody diarrhea which has since resolved. Denies fever, chills, chest pain, shortness of breath, vomiting, urinary symptoms, blood in urine or stool, vaginal bleeding, vaginal discharge. Patient denies taking any medications at home for her symptoms. Patient reports eating a Snickers on Friday night which she thinks has worsened her abdominal pain due to the candy containing peanuts. Patient denies currently being on any antibiotics.  Past Medical History:  Diagnosis Date  . Brown recluse spider bite   . Constipation   . Diverticulitis   . Hyperlipidemia   . Lyme disease   . Migraines   . Panic disorder   . Shingles     Patient Active Problem List   Diagnosis Date Noted  . Hyperlipidemia 11/15/2015  . Constipation 04/09/2015  . Palpitations 08/11/2013  . Chest pressure 07/15/2013  .  Right arm weakness 07/15/2013  . Sigmoid diverticulitis 07/14/2012  . DYSPEPSIA 09/06/2009  . IBS 09/06/2009  . COLONIC POLYPS, HX OF 09/06/2009  . WEIGHT LOSS 06/07/2009  . NAUSEA AND VOMITING 06/07/2009  . ABDOMINAL PAIN 06/07/2009  . EPIGASTRIC PAIN 06/07/2009  . COLONIC POLYPS 02/21/2009  . DYSPHAGIA 02/21/2009  . PANIC DISORDER 02/20/2009  . CATARACTS 02/20/2009  . DIVERTICULOSIS OF COLON 02/20/2009  . HELICOBACTER PYLORI GASTRITIS, HX OF 02/20/2009    Past Surgical History:  Procedure Laterality Date  . APPENDECTOMY    . CHOLECYSTECTOMY    . COLONOSCOPY  2012   RMR: 1. External hemorrhoids, otherwise normal rectum. 2. Left-sided diverticula status post snare polypectomy left colon polyp. Remainder of the colonic mucosa and terminal ileum mucoa appeared normal.   . TONSILLECTOMY      OB History    No data available       Home Medications    Prior to Admission medications   Medication Sig Start Date End Date Taking? Authorizing Provider  alprazolam Duanne Moron) 2 MG tablet Take 1 mg by mouth every 6 (six) hours. Scheduled doses May take half tablet   Yes Historical Provider, MD  metoprolol succinate (TOPROL-XL) 25 MG 24 hr tablet Take 1 tablet (25 mg total) by mouth daily. Patient taking differently: Take 12.5 mg by mouth every evening.  11/15/15  Yes Lelon Perla, MD  rosuvastatin (CRESTOR) 10 MG tablet Take 10 mg by mouth daily. 10/23/15  Yes Historical Provider, MD  amoxicillin-clavulanate (AUGMENTIN) 875-125 MG tablet Take 1 tablet by mouth every 12 (twelve)  hours. Patient not taking: Reported on 09/22/2016 08/31/16   Nita Sells, MD  aspirin 81 MG chewable tablet Chew 81 mg by mouth daily as needed.    Historical Provider, MD  cholecalciferol (VITAMIN D) 1000 UNITS tablet Take 2,000 Units by mouth every morning.     Historical Provider, MD  docusate sodium 100 MG CAPS Take 100 mg by mouth daily. Patient not taking: Reported on 09/22/2016 07/16/13   Erline Hau, MD  ondansetron (ZOFRAN) 4 MG tablet Take 1 tablet (4 mg total) by mouth every 8 (eight) hours as needed for nausea or vomiting. Patient not taking: Reported on 09/22/2016 02/19/14   Clayton Bibles, PA-C    Family History Family History  Problem Relation Age of Onset  . Diabetes Mother   . Hypertension Mother   . Heart attack Mother   . Cancer Father     Melanoma   . Diabetes Brother   . Diabetes Son     Social History Social History  Substance Use Topics  . Smoking status: Never Smoker  . Smokeless tobacco: Never Used  . Alcohol use No     Allergies   Ciprofloxacin; Codeine; Metronidazole; Morphine and related; and Other   Review of Systems Review of Systems  Gastrointestinal: Positive for abdominal pain, constipation, diarrhea and nausea.  Musculoskeletal: Positive for back pain.  All other systems reviewed and are negative.    Physical Exam Updated Vital Signs BP 125/63   Pulse 71   Temp 97.8 F (36.6 C) (Oral)   Resp 16   Ht 5\' 6"  (1.676 m)   Wt 82.6 kg   SpO2 100%   BMI 29.38 kg/m   Physical Exam  Constitutional: She is oriented to person, place, and time. She appears well-developed and well-nourished. No distress.  HENT:  Head: Normocephalic and atraumatic.  Mouth/Throat: Uvula is midline, oropharynx is clear and moist and mucous membranes are normal. No oropharyngeal exudate, posterior oropharyngeal edema, posterior oropharyngeal erythema or tonsillar abscesses. No tonsillar exudate.  Eyes: Conjunctivae and EOM are normal. Right eye exhibits no discharge. Left eye exhibits no discharge. No scleral icterus.  Neck: Normal range of motion. Neck supple.  Cardiovascular: Normal rate, regular rhythm, normal heart sounds and intact distal pulses.   Pulmonary/Chest: Effort normal and breath sounds normal. No respiratory distress. She has no wheezes. She has no rales. She exhibits no tenderness.  Abdominal: Soft. Bowel sounds are normal. She  exhibits no distension and no mass. There is tenderness. There is no rebound and no guarding. No hernia.  Mild TTP over lower abdominal quadrants, worse over left lower quadrant. No CVA tenderness.   Musculoskeletal: Normal range of motion. She exhibits no edema.  No midline C, T, or L tenderness. Full range of motion of neck and back. Full range of motion of bilateral upper and lower extremities, with 5/5 strength. Sensation intact. 2+ radial and PT pulses. Cap refill <2 seconds.   Lymphadenopathy:    She has no cervical adenopathy.  Neurological: She is alert and oriented to person, place, and time.  Skin: Skin is warm and dry. Capillary refill takes less than 2 seconds. She is not diaphoretic.  Nursing note and vitals reviewed.    ED Treatments / Results  Labs (all labs ordered are listed, but only abnormal results are displayed) Labs Reviewed  COMPREHENSIVE METABOLIC PANEL - Abnormal; Notable for the following:       Result Value   Potassium 3.3 (*)    Glucose,  Bld 100 (*)    All other components within normal limits  LIPASE, BLOOD  CBC  URINALYSIS, ROUTINE W REFLEX MICROSCOPIC (NOT AT [facility])    EKG  EKG Interpretation None       Radiology Ct Abdomen Pelvis W Contrast  Result Date: 09/22/2016 CLINICAL DATA:  70 year old female with lower abdominal pain, low back pain with nausea for 4 days. Constipation and diarrhea. Recent diverticulitis. Initial encounter. EXAM: CT ABDOMEN AND PELVIS WITH CONTRAST TECHNIQUE: Multidetector CT imaging of the abdomen and pelvis was performed using the standard protocol following bolus administration of intravenous contrast. CONTRAST:  125mL ISOVUE-300 IOPAMIDOL (ISOVUE-300) INJECTION 61% COMPARISON:  CT Abdomen and Pelvis 08/27/2016 and earlier. FINDINGS: Lower chest: Stable lung bases.  No pericardial or pleural effusion. No upper abdominal free air. Hepatobiliary: Borderline to mild hepatic steatosis. Gallbladder appears to be surgically  absent as before. Mild intrahepatic biliary ductal prominence is stable. Pancreas: Negative. Spleen: Negative. Adrenals/Urinary Tract: Negative adrenal glands. Bilateral renal enhancement and contrast excretion is normal. Unremarkable urinary bladder. Stomach/Bowel: Small volume of free fluid now anterior to the rectum which otherwise remains normal. Severe diverticulosis throughout the sigmoid colon with continued inflammation including a indistinct appearance of the sigmoid wall throughout. Mesenteric stranding along the distal sigmoid colon has increased on series 21, image 65. At the same time mesenteric stranding previously-seen at the junction of the descending and sigmoid colon has regressed. No extraluminal gas. Diverticulosis throughout the left colon without definite active inflammation. Negative transverse colon. Decompressed right colon. Negative terminal ileum. Diminutive or absent appendix. No dilated small bowel. Small gastric hiatal hernia is unchanged. Decompressed stomach and duodenum with a small volume of oral contrast. Vascular/Lymphatic: Calcified aortic atherosclerosis. Major arterial structures are patent. Portal venous system is patent. No lymphadenopathy. Reproductive: Negative. Other: Small volume pelvic free fluid is new (series 21, image 70). No organized or drainable pelvic fluid collection. Musculoskeletal: Stable, including levoconvex lumbar scoliosis. IMPRESSION: 1. Unresolved sigmoid diverticulitis since October. There is increased mesenteric inflammation about the distal sigmoid colon and new small volume of layering pelvic free fluid, however, inflammation about the proximal sigmoid colon has regressed since 08/27/2016. No abscess, drainable fluid collection, or other complicating features. 2. Otherwise stable abdomen and pelvis. Electronically Signed   By: Genevie Ann M.D.   On: 09/22/2016 15:40    Procedures Procedures (including critical care time)  Medications Ordered in  ED Medications  piperacillin-tazobactam (ZOSYN) IVPB 3.375 g (3.375 g Intravenous New Bag/Given 09/22/16 1226)  sodium chloride 0.9 % bolus 1,000 mL (1,000 mLs Intravenous New Bag/Given 09/22/16 1026)  ondansetron (ZOFRAN) injection 4 mg (4 mg Intravenous Given 09/22/16 1026)  fentaNYL (SUBLIMAZE) injection 50 mcg (50 mcg Intravenous Given 09/22/16 1026)  potassium chloride SA (K-DUR,KLOR-CON) CR tablet 40 mEq (40 mEq Oral Given 09/22/16 1256)  fentaNYL (SUBLIMAZE) injection 25 mcg (25 mcg Intravenous Given 09/22/16 1439)  iopamidol (ISOVUE-300) 61 % injection (100 mLs  Contrast Given 09/22/16 1508)     Initial Impression / Assessment and Plan / ED Course  I have reviewed the triage vital signs and the nursing notes.  Pertinent labs & imaging results that were available during my care of the patient were reviewed by me and considered in my medical decision making (see chart for details).  Clinical Course    Patient presents with worsening lower abdominal pain and associated low back pain that started yesterday morning. She reports her pain is consistent with her presentation of diverticulitis. She reports being admitted last month  for diverticulitis and was discharged home on 10/30 with improvement of symptoms. Denies fever. VSS. Exam revealed mild tenderness palpation over lower abdominal quadrants, mildly worse over left lower quadrant, no peritoneal signs. Remaining exam unremarkable. No neuro deficits. Patient given IV fluids, Zofran and pain meds. K 3.3, patient given potassium supplement in the ED. Remaining labs and urine unremarkable. On reevaluation patient reports having continued mild intermittent abdominal pain and reports concern for recurrent diverticulitis. CT abdomen pelvis ordered for further evaluation. CT showed unresolved sigmoid diverticulitis with no abscess or other complicating features. Consulted hospitalist for admission. Erin Hearing, NP agrees to admission.  Final  Clinical Impressions(s) / ED Diagnoses   Final diagnoses:  Diverticulitis of large intestine without perforation or abscess without bleeding    New Prescriptions New Prescriptions   No medications on file     Nona Dell, [place] 09/22/16 1608    [name], MD 09/24/16 [phone]

## 2016-09-22 NOTE — ED Notes (Signed)
Patient ambulated to restroom without assistance,

## 2016-09-22 NOTE — ED Triage Notes (Signed)
Per Pt, Pt is coming from home with complaints of lower abdomina pain and back pain that started yesterday. Pt reports having Hx of Diverticulitis two weeks ago. Reports eating snickers yesterday being unaware that it had peanuts. Pt reports pain increasing over the night.

## 2016-09-23 ENCOUNTER — Encounter (HOSPITAL_COMMUNITY): Payer: Self-pay | Admitting: General Practice

## 2016-09-23 DIAGNOSIS — E785 Hyperlipidemia, unspecified: Secondary | ICD-10-CM

## 2016-09-23 DIAGNOSIS — E876 Hypokalemia: Secondary | ICD-10-CM

## 2016-09-23 DIAGNOSIS — K5732 Diverticulitis of large intestine without perforation or abscess without bleeding: Principal | ICD-10-CM

## 2016-09-23 LAB — CBC
HEMATOCRIT: 34.2 % — AB (ref 36.0–46.0)
HEMOGLOBIN: 11 g/dL — AB (ref 12.0–15.0)
MCH: 31.1 pg (ref 26.0–34.0)
MCHC: 32.2 g/dL (ref 30.0–36.0)
MCV: 96.6 fL (ref 78.0–100.0)
Platelets: 229 10*3/uL (ref 150–400)
RBC: 3.54 MIL/uL — ABNORMAL LOW (ref 3.87–5.11)
RDW: 13.3 % (ref 11.5–15.5)
WBC: 5.2 10*3/uL (ref 4.0–10.5)

## 2016-09-23 LAB — COMPREHENSIVE METABOLIC PANEL
ALBUMIN: 3.1 g/dL — AB (ref 3.5–5.0)
ALK PHOS: 65 U/L (ref 38–126)
ALT: 12 U/L — ABNORMAL LOW (ref 14–54)
ANION GAP: 6 (ref 5–15)
AST: 19 U/L (ref 15–41)
BILIRUBIN TOTAL: 0.8 mg/dL (ref 0.3–1.2)
BUN: 7 mg/dL (ref 6–20)
CALCIUM: 8.5 mg/dL — AB (ref 8.9–10.3)
CO2: 24 mmol/L (ref 22–32)
Chloride: 109 mmol/L (ref 101–111)
Creatinine, Ser: 0.89 mg/dL (ref 0.44–1.00)
GFR calc Af Amer: >60 mL/min (ref >60)
GFR calc non Af Amer: >60 mL/min (ref >60)
GLUCOSE: 88 mg/dL (ref 65–99)
Potassium: 3.7 mmol/L (ref 3.5–5.1)
SODIUM: 139 mmol/L (ref 135–145)
TOTAL PROTEIN: 5.8 g/dL — AB (ref 6.5–8.1)

## 2016-09-23 NOTE — Progress Notes (Signed)
PROGRESS NOTE  Alexa Wilson  [id number] DOB: 12-26-1945  DOA: 09/22/2016 PCP: Glenda Chroman, MD  GI: Dr. Sydell Axon   Brief Narrative:  70 year old female with PMH of dyslipidemia, colonic polyps with last colonoscopy approximately 5 years ago (due for repeat at this time), GERD, constipation, recent hospitalization for acute diverticulitis without abscess who was discharged on 10/30 on Augmentin after being treated in the hospital with Zosyn, noted within the week of discharge that she was completely better but 2 days prior to this admission, her pain returned. CT abdomen showed improvement in previous inflammation at the proximal sigmoid colon but increased mesenteric inflammation about the distal sigmoid colon. She was admitted for recurrent acute diverticulitis.   Assessment & Plan:   Principal Problem:   Diverticulitis Active Problems:   Hyperlipidemia   Acute hypokalemia   Acute diverticulitis, recurrent - History as indicated above where she was treated inpatient with Zosyn and discharged on oral Augmentin with improvement but returned with abdominal pain 2 days prior to this admission. - Treating with bowel rest, IV fluids, empiric IV Cipro and Flagyl and pain management. Advised her that she will need follow-up with her outpatient GI for repeat colonoscopy in 4-6 weeks after resolution of her acute diverticulitis and she verbalized understanding. - Monitor closely and consider surgical consultation if she gets worse or does not improve. - Clinically improved compared to admission.  Hypokalemia Replaced  Anemia - Follow CBC in a.m.   Hyperlipidemia - Holding statins at this time.  Essential hypertension - Continue low-dose Toprol.  Anxiety disorder and panic attacks - Continue Xanax.   DVT prophylaxis: Lovenox Code Status: Full Family Communication: None at bedside Disposition Plan: DC home when medically improved, possibly in 3-4 days.   Consultants:    None   Procedures:   None   Antimicrobials:   IV Cipro and Flagyl   Subjective: Feels much better. Passed a lot of flatus and small BM. Indicates that she has minimal intermittent lower abdominal pain. No nausea or vomiting. Denies any other complaints.   Objective:  Vitals:   09/22/16 1734 09/22/16 2145 09/23/16 0605 09/23/16 1420  BP: 133/66 129/62 (!) 118/58 122/66  Pulse: 71 72 63 68  Resp:  17 17 18   Temp: 98.5 F (36.9 C) 98 F (36.7 C) 97.9 F (36.6 C) 98 F (36.7 C)  TempSrc: Oral Oral Oral Oral  SpO2: 100% 99% 100% 100%  Weight:      Height:        Intake/Output Summary (Last 24 hours) at 09/23/16 1755 Last data filed at 09/23/16 1700  Gross per 24 hour  Intake                0 ml  Output                0 ml  Net                0 ml   Filed Weights   09/22/16 0934  Weight: 82.6 kg (182 lb)    Examination:  General exam: Pleasant middle-aged female lying comfortably supine in bed. Respiratory system: Clear to auscultation. Respiratory effort normal. Cardiovascular system: S1 & S2 heard, RRR. No JVD, murmurs, rubs, gallops or clicks. No pedal edema. Gastrointestinal system: Abdomen is nondistended, soft and nontender. No organomegaly or masses felt. Normal bowel sounds heard. Central nervous system: Alert and oriented. No focal neurological deficits. Extremities: Symmetric 5 x 5 power. Skin: No rashes, lesions or ulcers  Psychiatry: Judgement and insight appear normal. Mood & affect appropriate.     Data Reviewed: I have personally reviewed following labs and imaging studies  CBC:  Recent Labs Lab 09/22/16 0944 09/23/16 0440  WBC 10.1 5.2  HGB 13.0 11.0*  HCT 40.0 34.2*  MCV 95.7 96.6  PLT 296 [id number]   Basic Metabolic Panel:  Recent Labs Lab 09/22/16 0944 09/23/16 0440  NA 141 139  K 3.3* 3.7  CL 108 109  CO2 24 24  GLUCOSE 100* 88  BUN 9 7  CREATININE 0.84 0.89  CALCIUM 9.4 8.5*   GFR: Estimated Creatinine Clearance: 64.6  mL/min (by C-G formula based on SCr of 0.89 mg/dL). Liver Function Tests:  Recent Labs Lab 09/22/16 0944 09/23/16 0440  AST 27 19  ALT 19 12*  ALKPHOS 86 65  BILITOT 0.8 0.8  PROT 7.1 5.8*  ALBUMIN 4.3 3.1*    Recent Labs Lab 09/22/16 0944  LIPASE 40   No results for input(s): AMMONIA in the last 168 hours. Coagulation Profile: No results for input(s): INR, PROTIME in the last 168 hours. Cardiac Enzymes: No results for input(s): CKTOTAL, CKMB, CKMBINDEX, TROPONINI in the last 168 hours. BNP (last 3 results) No results for input(s): PROBNP in the last 8760 hours. HbA1C: No results for input(s): HGBA1C in the last 72 hours. CBG: No results for input(s): GLUCAP in the last 168 hours. Lipid Profile: No results for input(s): CHOL, HDL, LDLCALC, TRIG, CHOLHDL, LDLDIRECT in the last 72 hours. Thyroid Function Tests: No results for input(s): TSH, T4TOTAL, FREET4, T3FREE, THYROIDAB in the last 72 hours. Anemia Panel: No results for input(s): VITAMINB12, FOLATE, FERRITIN, TIBC, IRON, RETICCTPCT in the last 72 hours.  Sepsis Labs: No results for input(s): PROCALCITON, LATICACIDVEN in the last 168 hours.  No results found for this or any previous visit (from the past 240 hour(s)).       Radiology Studies: Ct Abdomen Pelvis W Contrast  Result Date: 09/22/2016 CLINICAL DATA:  70 year old female with lower abdominal pain, low back pain with nausea for 4 days. Constipation and diarrhea. Recent diverticulitis. Initial encounter. EXAM: CT ABDOMEN AND PELVIS WITH CONTRAST TECHNIQUE: Multidetector CT imaging of the abdomen and pelvis was performed using the standard protocol following bolus administration of intravenous contrast. CONTRAST:  141mL ISOVUE-300 IOPAMIDOL (ISOVUE-300) INJECTION 61% COMPARISON:  CT Abdomen and Pelvis 08/27/2016 and earlier. FINDINGS: Lower chest: Stable lung bases.  No pericardial or pleural effusion. No upper abdominal free air. Hepatobiliary: Borderline to  mild hepatic steatosis. Gallbladder appears to be surgically absent as before. Mild intrahepatic biliary ductal prominence is stable. Pancreas: Negative. Spleen: Negative. Adrenals/Urinary Tract: Negative adrenal glands. Bilateral renal enhancement and contrast excretion is normal. Unremarkable urinary bladder. Stomach/Bowel: Small volume of free fluid now anterior to the rectum which otherwise remains normal. Severe diverticulosis throughout the sigmoid colon with continued inflammation including a indistinct appearance of the sigmoid wall throughout. Mesenteric stranding along the distal sigmoid colon has increased on series 21, image 65. At the same time mesenteric stranding previously-seen at the junction of the descending and sigmoid colon has regressed. No extraluminal gas. Diverticulosis throughout the left colon without definite active inflammation. Negative transverse colon. Decompressed right colon. Negative terminal ileum. Diminutive or absent appendix. No dilated small bowel. Small gastric hiatal hernia is unchanged. Decompressed stomach and duodenum with a small volume of oral contrast. Vascular/Lymphatic: Calcified aortic atherosclerosis. Major arterial structures are patent. Portal venous system is patent. No lymphadenopathy. Reproductive: Negative. Other: Small volume pelvic free fluid  is new (series 21, image 70). No organized or drainable pelvic fluid collection. Musculoskeletal: Stable, including levoconvex lumbar scoliosis. IMPRESSION: 1. Unresolved sigmoid diverticulitis since October. There is increased mesenteric inflammation about the distal sigmoid colon and new small volume of layering pelvic free fluid, however, inflammation about the proximal sigmoid colon has regressed since 08/27/2016. No abscess, drainable fluid collection, or other complicating features. 2. Otherwise stable abdomen and pelvis. Electronically Signed   By: Genevie Ann M.D.   On: 09/22/2016 15:40        Scheduled  Meds: . alprazolam  1 mg Oral Q6H  . ciprofloxacin  400 mg Intravenous Q12H  . enoxaparin (LOVENOX) injection  40 mg Subcutaneous Q24H  . metoprolol succinate  12.5 mg Oral QPM  . metronidazole  500 mg Intravenous Q8H  . ondansetron (ZOFRAN) IV  4 mg Intravenous Q12H   Continuous Infusions: . 0.9 % NaCl with KCl 20 mEq / L 100 mL/hr at 09/23/16 1607     LOS: 1 day        [facility], MD [facility] Pager [phone]  If 7PM-7AM, please contact night-coverage [email or web address] Password [facility] 09/23/2016, 5:55 PM

## 2016-09-24 LAB — CBC
HEMATOCRIT: 33.5 % — AB (ref 36.0–46.0)
HEMOGLOBIN: 10.9 g/dL — AB (ref 12.0–15.0)
MCH: 31.2 pg (ref 26.0–34.0)
MCHC: 32.5 g/dL (ref 30.0–36.0)
MCV: 96 fL (ref 78.0–100.0)
Platelets: 221 10*3/uL (ref 150–400)
RBC: 3.49 MIL/uL — ABNORMAL LOW (ref 3.87–5.11)
RDW: 13.3 % (ref 11.5–15.5)
WBC: 4.5 10*3/uL (ref 4.0–10.5)

## 2016-09-24 MED ORDER — ACETAMINOPHEN 325 MG PO TABS: 650.0000 mg | ORAL_TABLET | Freq: Four times a day (QID) | ORAL | Status: DC | PRN

## 2016-09-24 MED ORDER — MORPHINE SULFATE (PF) 4 MG/ML IV SOLN: 1.0000 mg | INTRAVENOUS | Status: DC | PRN

## 2016-09-24 MED ORDER — KETOROLAC TROMETHAMINE 15 MG/ML IJ SOLN: 15.0000 mg | Freq: Three times a day (TID) | INTRAMUSCULAR | Status: DC | PRN

## 2016-09-24 NOTE — Progress Notes (Signed)
PROGRESS NOTE  Alexa Wilson  [id number] DOB: 1946/03/28  DOA: 09/22/2016 PCP: Glenda Chroman, MD  GI: Dr. Sydell Axon   Brief Narrative:  70 year old female with PMH of dyslipidemia, colonic polyps with last colonoscopy approximately 5 years ago (due for repeat at this time), GERD, constipation, recent hospitalization for acute diverticulitis without abscess who was discharged on 10/30 on Augmentin after being treated in the hospital with Zosyn, noted within the week of discharge that she was completely better but 2 days prior to this admission, her pain returned. CT abdomen showed improvement in previous inflammation at the proximal sigmoid colon but increased mesenteric inflammation about the distal sigmoid colon. She was admitted for recurrent acute diverticulitis.   Assessment & Plan:   Principal Problem:   Diverticulitis Active Problems:   Hyperlipidemia   Acute hypokalemia   Acute diverticulitis, Persistent Vs recurrent - History as indicated above where she was treated inpatient with Zosyn and discharged on oral Augmentin with improvement but returned with abdominal pain 2 days prior to this admission. - Treating with bowel rest, IV fluids, empiric IV Cipro and Flagyl and pain management. Advised her that she will need follow-up with her outpatient GI for repeat colonoscopy in 4-6 weeks after resolution of her acute diverticulitis and she verbalized understanding. - Monitor closely and consider surgical consultation if she gets worse or does not improve. Discussed with general surgeon on 11/23 who agreed with current plan and sees no surgical input. However if patient worsens or does not improve then may consider formal surgical consultation. - Clinically improving. Continue nothing by mouth for additional 24 hours and then start clear liquids and gradually advance diet as tolerated.  Hypokalemia Replaced  Anemia - Stable  Hyperlipidemia - Holding statins at this  time.  Essential hypertension - Continue low-dose Toprol. Controlled  Anxiety disorder and panic attacks - Continue Xanax.  Mild intermittent headache - Attributes it to lack of by mouth intake. Supportive treatment.   DVT prophylaxis: Lovenox Code Status: Full Family Communication: None at bedside Disposition Plan: DC home when medically improved, possibly in 2-3 days.   Consultants:   None   Procedures:   None   Antimicrobials:   IV Cipro and Flagyl   Subjective: Passing lots of flatus. No BM since yesterday. Occasional and minimal left lower quadrant abdominal pain. No nausea or vomiting. Intermittent minimal headache.  Objective:  Vitals:   09/23/16 0605 09/23/16 1420 09/23/16 2124 09/24/16 0443  BP: (!) 118/58 122/66 (!) 123/55 116/63  Pulse: 63 68 64 (!) 58  Resp: 17 18 18 16   Temp: 97.9 F (36.6 C) 98 F (36.7 C) 97.9 F (36.6 C) 97.6 F (36.4 C)  TempSrc: Oral Oral Oral Oral  SpO2: 100% 100% 98% 98%  Weight:      Height:        Intake/Output Summary (Last 24 hours) at 09/24/16 1223 Last data filed at 09/24/16 0422  Gross per 24 hour  Intake          1543.33 ml  Output                0 ml  Net          1543.33 ml   Filed Weights   09/22/16 0934  Weight: 82.6 kg (182 lb)    Examination:  General exam: Pleasant middle-aged female lying comfortably supine in bed. Respiratory system: Clear to auscultation. Respiratory effort normal. Cardiovascular system: S1 & S2 heard, RRR. No JVD, murmurs, rubs, gallops  or clicks. No pedal edema. Gastrointestinal system: Abdomen is nondistended, soft and nontender. No organomegaly or masses felt. Normal bowel sounds heard. Central nervous system: Alert and oriented. No focal neurological deficits. Extremities: Symmetric 5 x 5 power. Skin: No rashes, lesions or ulcers Psychiatry: Judgement and insight appear normal. Mood & affect appropriate.     Data Reviewed: I have personally reviewed following labs  and imaging studies  CBC:  Recent Labs Lab 09/22/16 0944 09/23/16 0440 09/24/16 0333  WBC 10.1 5.2 4.5  HGB 13.0 11.0* 10.9*  HCT 40.0 34.2* 33.5*  MCV 95.7 96.6 96.0  PLT 296 229 [id number]   Basic Metabolic Panel:  Recent Labs Lab 09/22/16 0944 09/23/16 0440  NA 141 139  K 3.3* 3.7  CL 108 109  CO2 24 24  GLUCOSE 100* 88  BUN 9 7  CREATININE 0.84 0.89  CALCIUM 9.4 8.5*   GFR: Estimated Creatinine Clearance: 64.6 mL/min (by C-G formula based on SCr of 0.89 mg/dL). Liver Function Tests:  Recent Labs Lab 09/22/16 0944 09/23/16 0440  AST 27 19  ALT 19 12*  ALKPHOS 86 65  BILITOT 0.8 0.8  PROT 7.1 5.8*  ALBUMIN 4.3 3.1*    Recent Labs Lab 09/22/16 0944  LIPASE 40   No results for input(s): AMMONIA in the last 168 hours. Coagulation Profile: No results for input(s): INR, PROTIME in the last 168 hours. Cardiac Enzymes: No results for input(s): CKTOTAL, CKMB, CKMBINDEX, TROPONINI in the last 168 hours. BNP (last 3 results) No results for input(s): PROBNP in the last 8760 hours. HbA1C: No results for input(s): HGBA1C in the last 72 hours. CBG: No results for input(s): GLUCAP in the last 168 hours. Lipid Profile: No results for input(s): CHOL, HDL, LDLCALC, TRIG, CHOLHDL, LDLDIRECT in the last 72 hours. Thyroid Function Tests: No results for input(s): TSH, T4TOTAL, FREET4, T3FREE, THYROIDAB in the last 72 hours. Anemia Panel: No results for input(s): VITAMINB12, FOLATE, FERRITIN, TIBC, IRON, RETICCTPCT in the last 72 hours.  Sepsis Labs: No results for input(s): PROCALCITON, LATICACIDVEN in the last 168 hours.  No results found for this or any previous visit (from the past 240 hour(s)).       Radiology Studies: Ct Abdomen Pelvis W Contrast  Result Date: 09/22/2016 CLINICAL DATA:  70 year old female with lower abdominal pain, low back pain with nausea for 4 days. Constipation and diarrhea. Recent diverticulitis. Initial encounter. EXAM: CT ABDOMEN AND  PELVIS WITH CONTRAST TECHNIQUE: Multidetector CT imaging of the abdomen and pelvis was performed using the standard protocol following bolus administration of intravenous contrast. CONTRAST:  189mL ISOVUE-300 IOPAMIDOL (ISOVUE-300) INJECTION 61% COMPARISON:  CT Abdomen and Pelvis 08/27/2016 and earlier. FINDINGS: Lower chest: Stable lung bases.  No pericardial or pleural effusion. No upper abdominal free air. Hepatobiliary: Borderline to mild hepatic steatosis. Gallbladder appears to be surgically absent as before. Mild intrahepatic biliary ductal prominence is stable. Pancreas: Negative. Spleen: Negative. Adrenals/Urinary Tract: Negative adrenal glands. Bilateral renal enhancement and contrast excretion is normal. Unremarkable urinary bladder. Stomach/Bowel: Small volume of free fluid now anterior to the rectum which otherwise remains normal. Severe diverticulosis throughout the sigmoid colon with continued inflammation including a indistinct appearance of the sigmoid wall throughout. Mesenteric stranding along the distal sigmoid colon has increased on series 21, image 65. At the same time mesenteric stranding previously-seen at the junction of the descending and sigmoid colon has regressed. No extraluminal gas. Diverticulosis throughout the left colon without definite active inflammation. Negative transverse colon. Decompressed right colon. Negative  terminal ileum. Diminutive or absent appendix. No dilated small bowel. Small gastric hiatal hernia is unchanged. Decompressed stomach and duodenum with a small volume of oral contrast. Vascular/Lymphatic: Calcified aortic atherosclerosis. Major arterial structures are patent. Portal venous system is patent. No lymphadenopathy. Reproductive: Negative. Other: Small volume pelvic free fluid is new (series 21, image 70). No organized or drainable pelvic fluid collection. Musculoskeletal: Stable, including levoconvex lumbar scoliosis. IMPRESSION: 1. Unresolved sigmoid  diverticulitis since October. There is increased mesenteric inflammation about the distal sigmoid colon and new small volume of layering pelvic free fluid, however, inflammation about the proximal sigmoid colon has regressed since 08/27/2016. No abscess, drainable fluid collection, or other complicating features. 2. Otherwise stable abdomen and pelvis. Electronically Signed   By: Genevie Ann M.D.   On: 09/22/2016 15:40        Scheduled Meds: . alprazolam  1 mg Oral Q6H  . ciprofloxacin  400 mg Intravenous Q12H  . enoxaparin (LOVENOX) injection  40 mg Subcutaneous Q24H  . metoprolol succinate  12.5 mg Oral QPM  . metronidazole  500 mg Intravenous Q8H  . ondansetron (ZOFRAN) IV  4 mg Intravenous Q12H   Continuous Infusions: . 0.9 % NaCl with KCl 20 mEq / L 100 mL/hr at 09/24/16 0110     LOS: 2 days        [facility], MD [facility] Pager [phone]  If 7PM-7AM, please contact night-coverage [email or web address] Password [facility] 09/24/2016, 12:23 PM

## 2016-09-25 MED ORDER — METRONIDAZOLE 500 MG PO TABS
500.0000 mg | ORAL_TABLET | Freq: Three times a day (TID) | ORAL | Status: DC
  Administered 2016-09-25 – 2016-09-26 (×3): 500 mg via ORAL
  Filled 2016-09-25 (×3): qty 1

## 2016-09-25 NOTE — Progress Notes (Signed)
PROGRESS NOTE  Alexa Wilson  [id number] DOB: 1946-05-21  DOA: 09/22/2016 PCP: Glenda Chroman, MD  GI: Dr. Sydell Axon   Brief Narrative:  70 year old female with PMH of dyslipidemia, colonic polyps with last colonoscopy approximately 5 years ago (due for repeat at this time), GERD, constipation, recent hospitalization for acute diverticulitis without abscess who was discharged on 10/30 on Augmentin after being treated in the hospital with Zosyn, noted within the week of discharge that she was completely better but 2 days prior to this admission, her pain returned. CT abdomen showed improvement in previous inflammation at the proximal sigmoid colon but increased mesenteric inflammation about the distal sigmoid colon. She was admitted for recurrent acute diverticulitis.   Assessment & Plan:   Principal Problem:   Diverticulitis Active Problems:   Hyperlipidemia   Acute hypokalemia   Acute diverticulitis, Persistent Vs recurrent - History as indicated above where she was treated inpatient with Zosyn and discharged on oral Augmentin with improvement but returned with abdominal pain 2 days prior to this admission. - Treating with bowel rest, IV fluids, empiric IV Cipro and Flagyl and pain management. Advised her that she will need follow-up with her outpatient GI for repeat colonoscopy in 4-6 weeks after resolution of her acute diverticulitis and she verbalized understanding. - Monitor closely and consider surgical consultation if she gets worse or does not improve. Discussed with general surgeon on 11/23 who agreed with current plan and sees no surgical input. However if patient worsens or does not improve then may consider formal surgical consultation. - Clinically improving. Start full liquid diet. If continues to make improvement, transitioned to soft diet and possible DC home on 11/25.  Hypokalemia Replaced  Anemia - Stable  Hyperlipidemia - Holding statins at this time.  Essential  hypertension - Continue low-dose Toprol. Controlled  Anxiety disorder and panic attacks - Continue Xanax.  Mild intermittent headache - Attributes it to lack of by mouth intake. Supportive treatment. Resolved.   DVT prophylaxis: Lovenox Code Status: Full Family Communication: None at bedside Disposition Plan: DC home when medically improved, possibly 11/25   Consultants:   None   Procedures:   None   Antimicrobials:   IV Cipro and Flagyl   Subjective: Had BM. No headache. Abdominal pain minimal. No other complaints reported.  Objective:  Vitals:   09/24/16 1500 09/24/16 2107 09/25/16 0357 09/25/16 1357  BP: 125/62 (!) 128/48 (!) 124/50 (!) 109/53  Pulse: 62 62 (!) 58 72  Resp: 16 17 18 18   Temp: 98 F (36.7 C) 98.2 F (36.8 C) 98.2 F (36.8 C) 98 F (36.7 C)  TempSrc: Oral Oral Oral Oral  SpO2: 97% 93% 98% 100%  Weight:      Height:        Intake/Output Summary (Last 24 hours) at 09/25/16 1727 Last data filed at 09/25/16 1400  Gross per 24 hour  Intake             2180 ml  Output                1 ml  Net             2179 ml   Filed Weights   09/22/16 0934  Weight: 82.6 kg (182 lb)    Examination:  General exam: Pleasant middle-aged female lying comfortably supine in bed. Respiratory system: Clear to auscultation. Respiratory effort normal. Cardiovascular system: S1 & S2 heard, RRR. No JVD, murmurs, rubs, gallops or clicks. No pedal edema. Gastrointestinal  system: Abdomen is nondistended, soft and nontender. No organomegaly or masses felt. Normal bowel sounds heard. Central nervous system: Alert and oriented. No focal neurological deficits. Extremities: Symmetric 5 x 5 power. Skin: No rashes, lesions or ulcers Psychiatry: Judgement and insight appear normal. Mood & affect appropriate.     Data Reviewed: I have personally reviewed following labs and imaging studies  CBC:  Recent Labs Lab 09/22/16 0944 09/23/16 0440 09/24/16 0333  WBC  10.1 5.2 4.5  HGB 13.0 11.0* 10.9*  HCT 40.0 34.2* 33.5*  MCV 95.7 96.6 96.0  PLT 296 229 [id number]   Basic Metabolic Panel:  Recent Labs Lab 09/22/16 0944 09/23/16 0440  NA 141 139  K 3.3* 3.7  CL 108 109  CO2 24 24  GLUCOSE 100* 88  BUN 9 7  CREATININE 0.84 0.89  CALCIUM 9.4 8.5*   GFR: Estimated Creatinine Clearance: 64.6 mL/min (by C-G formula based on SCr of 0.89 mg/dL). Liver Function Tests:  Recent Labs Lab 09/22/16 0944 09/23/16 0440  AST 27 19  ALT 19 12*  ALKPHOS 86 65  BILITOT 0.8 0.8  PROT 7.1 5.8*  ALBUMIN 4.3 3.1*    Recent Labs Lab 09/22/16 0944  LIPASE 40   No results for input(s): AMMONIA in the last 168 hours. Coagulation Profile: No results for input(s): INR, PROTIME in the last 168 hours. Cardiac Enzymes: No results for input(s): CKTOTAL, CKMB, CKMBINDEX, TROPONINI in the last 168 hours. BNP (last 3 results) No results for input(s): PROBNP in the last 8760 hours. HbA1C: No results for input(s): HGBA1C in the last 72 hours. CBG: No results for input(s): GLUCAP in the last 168 hours. Lipid Profile: No results for input(s): CHOL, HDL, LDLCALC, TRIG, CHOLHDL, LDLDIRECT in the last 72 hours. Thyroid Function Tests: No results for input(s): TSH, T4TOTAL, FREET4, T3FREE, THYROIDAB in the last 72 hours. Anemia Panel: No results for input(s): VITAMINB12, FOLATE, FERRITIN, TIBC, IRON, RETICCTPCT in the last 72 hours.  Sepsis Labs: No results for input(s): PROCALCITON, LATICACIDVEN in the last 168 hours.  No results found for this or any previous visit (from the past 240 hour(s)).       Radiology Studies: No results found.      Scheduled Meds: . alprazolam  1 mg Oral Q6H  . ciprofloxacin  400 mg Intravenous Q12H  . enoxaparin (LOVENOX) injection  40 mg Subcutaneous Q24H  . metoprolol succinate  12.5 mg Oral QPM  . metroNIDAZOLE  500 mg Oral Q8H  . ondansetron (ZOFRAN) IV  4 mg Intravenous Q12H   Continuous Infusions: . 0.9 % NaCl  with KCl 20 mEq / L 50 mL/hr at 09/25/16 1611     LOS: 3 days        [facility], MD [facility] Pager [phone]  If 7PM-7AM, please contact night-coverage [email or web address] Password [facility] 09/25/2016, 5:27 PM

## 2016-09-26 MED ORDER — MOXIFLOXACIN HCL 400 MG PO TABS
400.0000 mg | ORAL_TABLET | Freq: Every day | ORAL | Status: DC
  Administered 2016-09-26 – 2016-09-27 (×2): 400 mg via ORAL
  Filled 2016-09-26 (×2): qty 1

## 2016-09-26 MED ORDER — SACCHAROMYCES BOULARDII 250 MG PO CAPS
250.0000 mg | ORAL_CAPSULE | Freq: Two times a day (BID) | ORAL | Status: DC
  Administered 2016-09-26 – 2016-09-27 (×3): 250 mg via ORAL
  Filled 2016-09-26 (×3): qty 1

## 2016-09-26 MED ORDER — NON FORMULARY: 400.0000 mg | Freq: Every day | Status: DC

## 2016-09-26 NOTE — Progress Notes (Signed)
PROGRESS NOTE  Alexa Wilson  [id number] DOB: Feb 27, 1946  DOA: 09/22/2016 PCP: Glenda Chroman, MD  GI: Dr. Sydell Axon   Brief Narrative:  70 year old female with PMH of dyslipidemia, colonic polyps with last colonoscopy approximately 5 years ago (due for repeat at this time), GERD, constipation, recent hospitalization for acute diverticulitis without abscess who was discharged on 10/30 on Augmentin after being treated in the hospital with Zosyn, noted within the week of discharge that she was completely better but 2 days prior to this admission, her pain returned. CT abdomen showed improvement in previous inflammation at the proximal sigmoid colon but increased mesenteric inflammation about the distal sigmoid colon. She was admitted for recurrent acute diverticulitis.   Assessment & Plan:   Principal Problem:   Diverticulitis Active Problems:   Hyperlipidemia   Acute hypokalemia   Acute diverticulitis, Persistent Vs recurrent - History as indicated above where she was treated inpatient with Zosyn and discharged on oral Augmentin with improvement but returned with abdominal pain 2 days prior to this admission. - Treating with bowel rest, IV fluids, empiric IV Cipro and Flagyl and pain management. Advised her that she will need follow-up with her outpatient GI for repeat colonoscopy in 4-6 weeks after resolution of her acute diverticulitis and she verbalized understanding. - Monitor closely and consider surgical consultation if she gets worse or does not improve. Discussed with general surgeon on 11/23 who agreed with current plan and sees no surgical input. However if patient worsens or does not improve then may consider formal surgical consultation. - Clinically improving. Started full liquid diet 11/24. Pharmacy automatically switched her from IV to oral metronidazole on 11/24. Patient complained of significant nausea early this morning. This is likely secondary to oral metronidazole rather  than Cipro. Discontinued metronidazole. Discussed with infectious disease M.D. on call regarding anaerobic coverage on 11/25 and he recommended changing to oral Avelox which provide some anaerobic coverage and discussed with pharmacy who have in stock. Augmentin not used because of recent failure and hesitant to use clindamycin along with quinolones for risk of C. difficile. Started probiotics. Advance diet this afternoon and if doing better, possible DC home on 11/26. Discussed in detail with patient verbalized understanding.  Hypokalemia Replaced  Anemia - Stable  Hyperlipidemia - Holding statins at this time.  Essential hypertension - Continue low-dose Toprol. Controlled  Anxiety disorder and panic attacks - Continue Xanax.  Mild intermittent headache - Attributes it to lack of by mouth intake. Supportive treatment. Resolved.   DVT prophylaxis: Lovenox Code Status: Full Family Communication: None at bedside Disposition Plan: DC home when medically improved, possibly 11/26   Consultants:   None   Procedures:   None   Antimicrobials:   IV Cipro and Flagyl   Subjective: Having BMs. No abdominal pain. Significant nausea this morning without vomiting.  Objective:  Vitals:   09/25/16 1357 09/25/16 2152 09/26/16 0534 09/26/16 1427  BP: (!) 109/53 (!) 117/59 (!) 108/54 (!) 124/57  Pulse: 72 62 62 73  Resp: 18 18 17 17   Temp: 98 F (36.7 C) 98.2 F (36.8 C) 98.2 F (36.8 C) 98 F (36.7 C)  TempSrc: Oral Oral  Oral  SpO2: 100% 98% 98% 100%  Weight:      Height:        Intake/Output Summary (Last 24 hours) at 09/26/16 1631 Last data filed at 09/26/16 1500  Gross per 24 hour  Intake          3486.67 ml  Output  8 ml  Net          3478.67 ml   Filed Weights   09/22/16 0934  Weight: 82.6 kg (182 lb)    Examination:  General exam: Pleasant middle-aged female ambulating comfortably in the room. Respiratory system: Clear to auscultation.  Respiratory effort normal. Cardiovascular system: S1 & S2 heard, RRR. No JVD, murmurs, rubs, gallops or clicks. No pedal edema. Gastrointestinal system: Abdomen is nondistended, soft and nontender. No organomegaly or masses felt. Normal bowel sounds heard. Central nervous system: Alert and oriented. No focal neurological deficits. Extremities: Symmetric 5 x 5 power. Skin: No rashes, lesions or ulcers Psychiatry: Judgement and insight appear normal. Mood & affect appropriate.     Data Reviewed: I have personally reviewed following labs and imaging studies  CBC:  Recent Labs Lab 09/22/16 0944 09/23/16 0440 09/24/16 0333  WBC 10.1 5.2 4.5  HGB 13.0 11.0* 10.9*  HCT 40.0 34.2* 33.5*  MCV 95.7 96.6 96.0  PLT 296 229 [id number]   Basic Metabolic Panel:  Recent Labs Lab 09/22/16 0944 09/23/16 0440  NA 141 139  K 3.3* 3.7  CL 108 109  CO2 24 24  GLUCOSE 100* 88  BUN 9 7  CREATININE 0.84 0.89  CALCIUM 9.4 8.5*   GFR: Estimated Creatinine Clearance: 64.6 mL/min (by C-G formula based on SCr of 0.89 mg/dL). Liver Function Tests:  Recent Labs Lab 09/22/16 0944 09/23/16 0440  AST 27 19  ALT 19 12*  ALKPHOS 86 65  BILITOT 0.8 0.8  PROT 7.1 5.8*  ALBUMIN 4.3 3.1*    Recent Labs Lab 09/22/16 0944  LIPASE 40   No results for input(s): AMMONIA in the last 168 hours. Coagulation Profile: No results for input(s): INR, PROTIME in the last 168 hours. Cardiac Enzymes: No results for input(s): CKTOTAL, CKMB, CKMBINDEX, TROPONINI in the last 168 hours. BNP (last 3 results) No results for input(s): PROBNP in the last 8760 hours. HbA1C: No results for input(s): HGBA1C in the last 72 hours. CBG: No results for input(s): GLUCAP in the last 168 hours. Lipid Profile: No results for input(s): CHOL, HDL, LDLCALC, TRIG, CHOLHDL, LDLDIRECT in the last 72 hours. Thyroid Function Tests: No results for input(s): TSH, T4TOTAL, FREET4, T3FREE, THYROIDAB in the last 72 hours. Anemia  Panel: No results for input(s): VITAMINB12, FOLATE, FERRITIN, TIBC, IRON, RETICCTPCT in the last 72 hours.  Sepsis Labs: No results for input(s): PROCALCITON, LATICACIDVEN in the last 168 hours.  No results found for this or any previous visit (from the past 240 hour(s)).       Radiology Studies: No results found.      Scheduled Meds: . alprazolam  1 mg Oral Q6H  . enoxaparin (LOVENOX) injection  40 mg Subcutaneous Q24H  . metoprolol succinate  12.5 mg Oral QPM  . moxifloxacin  400 mg Oral q1800  . ondansetron (ZOFRAN) IV  4 mg Intravenous Q12H  . saccharomyces boulardii  250 mg Oral BID   Continuous Infusions: . 0.9 % NaCl with KCl 20 mEq / L 50 mL/hr at 09/25/16 1611     LOS: 4 days        [facility], MD [facility] Pager [phone]  If 7PM-7AM, please contact night-coverage [email or web address] Password [facility] 09/26/2016, 4:31 PM

## 2016-09-27 MED ORDER — ONDANSETRON HCL 4 MG PO TABS: 4.0000 mg | ORAL_TABLET | Freq: Three times a day (TID) | ORAL | 0 refills | Status: DC | PRN

## 2016-09-27 MED ORDER — SACCHAROMYCES BOULARDII 250 MG PO CAPS: 250.0000 mg | ORAL_CAPSULE | Freq: Two times a day (BID) | ORAL | 0 refills | Status: DC

## 2016-09-27 MED ORDER — METOPROLOL SUCCINATE ER 25 MG PO TB24: 12.5000 mg | ORAL_TABLET | Freq: Every evening | ORAL | Status: DC

## 2016-09-27 MED ORDER — MOXIFLOXACIN HCL 400 MG PO TABS: 400.0000 mg | ORAL_TABLET | Freq: Every day | ORAL | 0 refills | Status: DC

## 2016-09-27 MED ORDER — ACETAMINOPHEN 325 MG PO TABS: 650.0000 mg | ORAL_TABLET | Freq: Four times a day (QID) | ORAL | Status: DC | PRN

## 2016-09-27 NOTE — Discharge Summary (Signed)
Physician Discharge Summary  Alexa Wilson [id number] DOB: June 20, 1946  PCP: Glenda Chroman, MD  Admit date: 09/22/2016 Discharge date: 09/27/2016  Recommendations for Outpatient Follow-up:  1. Dr. Jerene Bears, PCP in 5 days with repeat labs (CBC & BMP). 2. Dr. Manus Rudd, GI in 4 weeks. Recommend colonoscopy in 6-8 weeks after treatment resolution of acute diverticulitis. 3. Dr. Leighton Ruff, General Surgery: For further evaluation of diverticulitis.  Home Health: None Equipment/Devices: None    Discharge Condition: Improved and stable  CODE STATUS: Full  Diet recommendation: Soft and low fiber diet for several days then gradually advance diet as tolerated.  Discharge Diagnoses:  Principal Problem:   Diverticulitis Active Problems:   Hyperlipidemia   Acute hypokalemia   Brief/Interim Summary: 70 year old female with PMH of dyslipidemia, colonic polyps with last colonoscopy approximately 5 years ago (due for repeat at this time), GERD, constipation, anxiety, recent hospitalization for acute diverticulitis without abscess who was discharged on 10/30 on Augmentin after being treated in the hospital with Zosyn, noted within the week of discharge that she was completely better but 2 days prior to this admission, her pain returned. CT abdomen showed improvement in previous inflammation at the proximal sigmoid colon but increased mesenteric inflammation about the distal sigmoid colon. She was admitted for persistent acute diverticulitis.   Assessment & Plan:   Acute diverticulitis, Persistent - History as indicated above where she was treated inpatient with Zosyn and discharged on oral Augmentin with improvement but returned with abdominal pain 2 days prior to this admission. - Treated supportively with bowel rest, IV fluids, empiric IV Cipro and Flagyl and pain management. Advised her that she will need follow-up with her outpatient GI for repeat colonoscopy in 6-8 weeks after  resolution of her acute diverticulitis and she verbalized understanding. - Discussed with general surgeon on 11/23 who agreed with current plan and sees no surgical input. He recommended outpatient follow-up with his partner in colorectal surgeon. - Diet was slowly advanced and she tolerated soft diet. She was transitioned to oral Flagyl which she did not tolerate due to severe nausea. This was likely secondary to oral metronidazole rather than Cipro. Discontinued metronidazole. Discussed with infectious disease M.D. on call regarding anaerobic coverage on 11/25 and he recommended changing to oral Avelox which provides some anaerobic coverage. Augmentin not used because of recent failure and hesitant to use clindamycin along with quinolones for risk of C. difficile. Started probiotics.  - Patient has tolerated Avelox without any further nausea, vomiting or abdominal pain. Having BMs. No fever or chills. As per ID recommendations, complete total 10 days of antibiotics. Outpatient follow-up with PCP and GI.  Hypokalemia Replaced  Anemia - Stable. Outpatient follow-up and evaluation as deemed necessary.  Hyperlipidemia - Continue home statins.  Essential hypertension - Continue low-dose Toprol. Controlled  Anxiety disorder and panic attacks - Continue Xanax.  Mild intermittent headache - Attributes it to lack of by mouth intake. Supportive treatment. Resolved.   Consultants:   None   Procedures:   None    Discharge Instructions  Discharge Instructions    Call MD for:  extreme fatigue    Complete by:  As directed    Call MD for:  persistant dizziness or light-headedness    Complete by:  As directed    Call MD for:  persistant nausea and vomiting    Complete by:  As directed    Call MD for:  severe uncontrolled pain    Complete by:  As  directed    Call MD for:  temperature >100.4    Complete by:  As directed    Diet - low sodium heart healthy    Complete by:  As  directed    Discharge instructions    Complete by:  As directed    Diet: Soft and low fiber diet (please see instructions) for several days then gradually advance diet as tolerated.   Increase activity slowly    Complete by:  As directed        Medication List    STOP taking these medications   amoxicillin-clavulanate 875-125 MG tablet Commonly known as:  AUGMENTIN   aspirin 81 MG chewable tablet   cholecalciferol 1000 units tablet Commonly known as:  VITAMIN D   DSS 100 MG Caps     TAKE these medications   acetaminophen 325 MG tablet Commonly known as:  TYLENOL Take 2 tablets (650 mg total) by mouth every 6 (six) hours as needed for mild pain, moderate pain, fever or headache.   alprazolam 2 MG tablet Commonly known as:  XANAX Take 1 mg by mouth every 6 (six) hours. Scheduled doses May take half tablet   metoprolol succinate 25 MG 24 hr tablet Commonly known as:  TOPROL-XL Take 0.5 tablets (12.5 mg total) by mouth every evening.   moxifloxacin 400 MG tablet Commonly known as:  AVELOX Take 1 tablet (400 mg total) by mouth daily at 6 PM.   ondansetron 4 MG tablet Commonly known as:  ZOFRAN Take 1 tablet (4 mg total) by mouth every 8 (eight) hours as needed for nausea or vomiting.   rosuvastatin 10 MG tablet Commonly known as:  CRESTOR Take 10 mg by mouth daily.   saccharomyces boulardii 250 MG capsule Commonly known as:  FLORASTOR Take 1 capsule (250 mg total) by mouth 2 (two) times daily.      Follow-up Information    Glenda Chroman, MD. Schedule an appointment as soon as possible for a visit in 5 day(s).   Specialty:  Internal Medicine Why:  To be seen with repeat labs (CBC & BMP). Contact information: [place] [address] [phone]        Manus Rudd, MD. Schedule an appointment as soon as possible for a visit in 4 week(s).   Specialty:  Gastroenterology Contact information: [place] [address] [phone]         Rosario Adie., MD. Schedule an appointment as soon as possible for a visit.   Specialty:  General Surgery Why:  Follow re diverticulitis evaluation and management. Contact information: [address] [place] [address] [phone]          Allergies  Allergen Reactions  . Ciprofloxacin Nausea Only  . Codeine Other (See Comments)    Increases anxiety, Makes me feel all whoozie  . Metronidazole Nausea Only  . Morphine And Related Nausea And Vomiting  . Other Other (See Comments)    All antihistamines cause severe insomnia     Procedures/Studies: Ct Abdomen Pelvis W Contrast  Result Date: 09/22/2016 CLINICAL DATA:  70 year old female with lower abdominal pain, low back pain with nausea for 4 days. Constipation and diarrhea. Recent diverticulitis. Initial encounter. EXAM: CT ABDOMEN AND PELVIS WITH CONTRAST TECHNIQUE: Multidetector CT imaging of the abdomen and pelvis was performed using the standard protocol following bolus administration of intravenous contrast. CONTRAST:  157mL ISOVUE-300 IOPAMIDOL (ISOVUE-300) INJECTION 61% COMPARISON:  CT Abdomen and Pelvis 08/27/2016 and  earlier. FINDINGS: Lower chest: Stable lung bases.  No pericardial or pleural effusion. No upper abdominal free air. Hepatobiliary: Borderline to mild hepatic steatosis. Gallbladder appears to be surgically absent as before. Mild intrahepatic biliary ductal prominence is stable. Pancreas: Negative. Spleen: Negative. Adrenals/Urinary Tract: Negative adrenal glands. Bilateral renal enhancement and contrast excretion is normal. Unremarkable urinary bladder. Stomach/Bowel: Small volume of free fluid now anterior to the rectum which otherwise remains normal. Severe diverticulosis throughout the sigmoid colon with continued inflammation including a indistinct appearance of the sigmoid wall throughout. Mesenteric stranding along the distal sigmoid colon has increased on series 21, image 65. At the same  time mesenteric stranding previously-seen at the junction of the descending and sigmoid colon has regressed. No extraluminal gas. Diverticulosis throughout the left colon without definite active inflammation. Negative transverse colon. Decompressed right colon. Negative terminal ileum. Diminutive or absent appendix. No dilated small bowel. Small gastric hiatal hernia is unchanged. Decompressed stomach and duodenum with a small volume of oral contrast. Vascular/Lymphatic: Calcified aortic atherosclerosis. Major arterial structures are patent. Portal venous system is patent. No lymphadenopathy. Reproductive: Negative. Other: Small volume pelvic free fluid is new (series 21, image 70). No organized or drainable pelvic fluid collection. Musculoskeletal: Stable, including levoconvex lumbar scoliosis. IMPRESSION: 1. Unresolved sigmoid diverticulitis since October. There is increased mesenteric inflammation about the distal sigmoid colon and new small volume of layering pelvic free fluid, however, inflammation about the proximal sigmoid colon has regressed since 08/27/2016. No abscess, drainable fluid collection, or other complicating features. 2. Otherwise stable abdomen and pelvis. Electronically Signed   By: Genevie Ann M.D.   On: 09/22/2016 15:40      Subjective: Patient has tolerated Avelox without any further nausea, vomiting or abdominal pain. Having BMs. No fever or chills.   Discharge Exam:  Vitals:   09/26/16 0534 09/26/16 1427 09/26/16 2058 09/27/16 0507  BP: (!) 108/54 (!) 124/57 (!) 130/59 138/65  Pulse: 62 73 68 (!) 59  Resp: 17 17 17 17   Temp: 98.2 F (36.8 C) 98 F (36.7 C) 97.9 F (36.6 C) 97.5 F (36.4 C)  TempSrc:  Oral Oral Oral  SpO2: 98% 100% 100% 99%  Weight:      Height:        General exam: Pleasant middle-aged female ambulating comfortably in the room. Respiratory system: Clear to auscultation. Respiratory effort normal. Cardiovascular system: S1 & S2 heard, RRR. No JVD,  murmurs, rubs, gallops or clicks. No pedal edema. Gastrointestinal system: Abdomen is nondistended, soft and nontender. No organomegaly or masses felt. Normal bowel sounds heard. Central nervous system: Alert and oriented. No focal neurological deficits. Extremities: Symmetric 5 x 5 power. Skin: No rashes, lesions or ulcers Psychiatry: Judgement and insight appear normal. Mood & affect appropriate.     The results of significant diagnostics from this hospitalization (including imaging, microbiology, ancillary and laboratory) are listed below for reference.     Microbiology: No results found for this or any previous visit (from the past 240 hour(s)).   Labs: BNP (last 3 results) No results for input(s): BNP in the last 8760 hours. Basic Metabolic Panel:  Recent Labs Lab 09/22/16 0944 09/23/16 0440  NA 141 139  K 3.3* 3.7  CL 108 109  CO2 24 24  GLUCOSE 100* 88  BUN 9 7  CREATININE 0.84 0.89  CALCIUM 9.4 8.5*   Liver Function Tests:  Recent Labs Lab 09/22/16 0944 09/23/16 0440  AST 27 19  ALT 19 12*  ALKPHOS 86 65  BILITOT 0.8 0.8  PROT 7.1 5.8*  ALBUMIN 4.3 3.1*    Recent Labs Lab 09/22/16 0944  LIPASE 40   CBC:  Recent Labs Lab 09/22/16 0944 09/23/16 0440 09/24/16 0333  WBC 10.1 5.2 4.5  HGB 13.0 11.0* 10.9*  HCT 40.0 34.2* 33.5*  MCV 95.7 96.6 96.0  PLT 296 229 221   Urinalysis    Component Value Date/Time   COLORURINE YELLOW 09/22/2016 1003   APPEARANCEUR CLEAR 09/22/2016 1003   LABSPEC 1.012 09/22/2016 1003   PHURINE 5.0 09/22/2016 1003   GLUCOSEU NEGATIVE 09/22/2016 1003   HGBUR NEGATIVE 09/22/2016 1003   BILIRUBINUR NEGATIVE 09/22/2016 1003   KETONESUR NEGATIVE 09/22/2016 1003   PROTEINUR NEGATIVE 09/22/2016 1003   UROBILINOGEN 0.2 02/19/2014 0850   NITRITE NEGATIVE 09/22/2016 1003   LEUKOCYTESUR NEGATIVE 09/22/2016 1003     Time coordinating discharge: Over 30 minutes  SIGNED:  Vernell Leep, MD, FACP, FHM. [facility] Pager [phone]  If 7PM-7AM, please contact night-coverage [email or web address] Password [facility] 09/27/2016, 2:43 PM

## 2016-09-27 NOTE — Progress Notes (Signed)
Discharge home. Home discharge instruction given, no question verbalized. 

## 2016-09-28 ENCOUNTER — Encounter: Payer: Self-pay | Admitting: Internal Medicine

## 2016-10-02 DIAGNOSIS — Z6828 Body mass index (BMI) 28.0-28.9, adult: Secondary | ICD-10-CM | POA: Diagnosis not present

## 2016-10-02 DIAGNOSIS — Z713 Dietary counseling and surveillance: Secondary | ICD-10-CM | POA: Diagnosis not present

## 2016-10-02 DIAGNOSIS — K5732 Diverticulitis of large intestine without perforation or abscess without bleeding: Secondary | ICD-10-CM | POA: Diagnosis not present

## 2016-10-02 DIAGNOSIS — Z299 Encounter for prophylactic measures, unspecified: Secondary | ICD-10-CM | POA: Diagnosis not present

## 2016-10-02 DIAGNOSIS — Z09 Encounter for follow-up examination after completed treatment for conditions other than malignant neoplasm: Secondary | ICD-10-CM | POA: Diagnosis not present

## 2016-10-06 ENCOUNTER — Encounter: Payer: Self-pay | Admitting: Nurse Practitioner

## 2016-10-12 ENCOUNTER — Telehealth: Payer: Self-pay | Admitting: Internal Medicine

## 2016-10-12 NOTE — Telephone Encounter (Signed)
Pt called to cancel her OV with [facility] on 1/9. She is wanting to schedule her colonoscopy in February 2018. She was due colonoscopy in March 2017 but had lyme disease and put it off. She has [employer]. Does she need an OV or can she be triaged? Please advise [phone]

## 2016-10-12 NOTE — Telephone Encounter (Signed)
LMOM that I will put her on my call list to call in Jan for Feb and depending on what's going on we will see at that time if she needs an OV appt first or not.

## 2016-11-03 ENCOUNTER — Ambulatory Visit: Payer: Medicare Other | Admitting: Nurse Practitioner

## 2016-11-10 ENCOUNTER — Ambulatory Visit: Payer: Medicare Other | Admitting: Nurse Practitioner

## 2016-11-13 NOTE — Progress Notes (Signed)
HPI: FU palpitations and chest pain. Echocardiogram in September of 2014 showed normal LV function. There was grade 1 diastolic dysfunction. Trace mitral regurgitation. Patient has had intermittent palpitations for years. Exercise treadmill in November of 2014 showed poor exercise tolerance. There was no chest pain, no ECG changes and no arrhythmias. Monitor in November of 2014 showed sinus rhythm with PACs and PVCs. Since I last saw her, her palpitations are well controlled. There is no dyspnea, chest pain or syncope.  Current Outpatient Prescriptions  Medication Sig Dispense Refill  . acetaminophen (TYLENOL) 325 MG tablet Take 2 tablets (650 mg total) by mouth every 6 (six) hours as needed for mild pain, moderate pain, fever or headache.    . alprazolam (XANAX) 2 MG tablet Take 1 mg by mouth every 6 (six) hours. Scheduled doses May take half tablet    . Cholecalciferol (VITAMIN D3) 2000 units TABS Take 2,000 Units by mouth daily.    . metoprolol succinate (TOPROL-XL) 25 MG 24 hr tablet Take 0.5 tablets (12.5 mg total) by mouth every evening.    . ondansetron (ZOFRAN) 4 MG tablet Take 1 tablet (4 mg total) by mouth every 8 (eight) hours as needed for nausea or vomiting. 15 tablet 0  . rosuvastatin (CRESTOR) 10 MG tablet Take 10 mg by mouth daily.    Marland Kitchen saccharomyces boulardii (FLORASTOR) 250 MG capsule Take 1 capsule (250 mg total) by mouth 2 (two) times daily. 60 capsule 0   No current facility-administered medications for this visit.      Past Medical History:  Diagnosis Date  . Agoraphobia with panic disorder 1982  . Brown recluse spider bite   . Constipation   . Diverticulitis   . GERD (gastroesophageal reflux disease)   . Hyperlipidemia   . Lyme disease   . Migraines    "don't have them since menopause" (09/23/2016)  . Panic disorder   . Shingles     Past Surgical History:  Procedure Laterality Date  . APPENDECTOMY  1964  . CHOLECYSTECTOMY OPEN  1966  . COLONOSCOPY   2012   RMR: 1. External hemorrhoids, otherwise normal rectum. 2. Left-sided diverticula status post snare polypectomy left colon polyp. Remainder of the colonic mucosa and terminal ileum mucoa appeared normal.   . DILATION AND CURETTAGE OF UTERUS    . TONSILLECTOMY  1961    Social History   Social History  . Marital status: Widowed    Spouse name: N/A  . Number of children: 5  . Years of education: N/A   Occupational History  . Not on file.   Social History Main Topics  . Smoking status: Never Smoker  . Smokeless tobacco: Never Used  . Alcohol use No  . Drug use: No  . Sexual activity: No   Other Topics Concern  . Not on file   Social History Narrative  . No narrative on file    Family History  Problem Relation Age of Onset  . Diabetes Mother   . Hypertension Mother   . Heart attack Mother   . Cancer Father     Melanoma   . Diabetes Brother   . Diabetes Son     ROS: no fevers or chills, productive cough, hemoptysis, dysphasia, odynophagia, melena, hematochezia, dysuria, hematuria, rash, seizure activity, orthopnea, PND, pedal edema, claudication. Remaining systems are negative.  Physical Exam: Well-developed well-nourished in no acute distress.  Skin is warm and dry.  HEENT is normal.  Neck is supple.  Chest is clear to auscultation with normal expansion.  Cardiovascular exam is regular rate and rhythm.  Abdominal exam nontender or distended. No masses palpated. Extremities show no edema. Varicosities noted. neuro grossly intact  ECG-Sinus rhythm at a rate of 72. Left axis deviation. No ST changes.  A/P  1 palpitations-symptoms are reasonably well controlled. Continue beta blocker. She asked about trying to be off of this medication. I explained that I have no problem with her discontinuing but her palpitations may worsen. She could then resume if needed.  2 hyperlipidemia-continue statin. Patient is managed by primary care physician.   Kirk Ruths,  MD

## 2016-11-23 ENCOUNTER — Encounter: Payer: Self-pay | Admitting: Cardiology

## 2016-11-23 ENCOUNTER — Ambulatory Visit (INDEPENDENT_AMBULATORY_CARE_PROVIDER_SITE_OTHER): Payer: Medicare Other | Admitting: Cardiology

## 2016-11-23 VITALS — BP 140/74 | HR 72 | Ht 66.0 in | Wt 179.0 lb

## 2016-11-23 DIAGNOSIS — E7800 Pure hypercholesterolemia, unspecified: Secondary | ICD-10-CM

## 2016-11-23 DIAGNOSIS — R002 Palpitations: Secondary | ICD-10-CM | POA: Diagnosis not present

## 2016-11-23 NOTE — Patient Instructions (Signed)
Your physician wants you to follow-up in: ONE YEAR WITH DR CRENSHAW You will receive a reminder letter in the mail two months in advance. If you don't receive a letter, please call our office to schedule the follow-up appointment.   If you need a refill on your cardiac medications before your next appointment, please call your pharmacy.  

## 2016-11-26 ENCOUNTER — Telehealth: Payer: Self-pay

## 2016-11-26 NOTE — Telephone Encounter (Signed)
Called pt to see if she is ready to schedule her colonoscopy. She is a little over due. It was due in 01/2016. But she had lyme disease.  She is having a lot of family sickness now and not sure when she can come in.  She will call me when she is free and be triaged to see if she needs OV prior to procedure.

## 2016-12-23 ENCOUNTER — Other Ambulatory Visit: Payer: Self-pay | Admitting: Cardiology

## 2016-12-23 DIAGNOSIS — E785 Hyperlipidemia, unspecified: Secondary | ICD-10-CM

## 2016-12-23 DIAGNOSIS — R002 Palpitations: Secondary | ICD-10-CM

## 2016-12-24 ENCOUNTER — Telehealth: Payer: Self-pay

## 2016-12-24 NOTE — Telephone Encounter (Signed)
Pt said that she was returning DS call to schedule a colonoscopy. She refused to go to [facility] and she said if DS called her back that she wouldn't be available. She said that she would try again tomorrow. I told her that I would let DS know that she had called.

## 2017-01-05 NOTE — Telephone Encounter (Signed)
Gastroenterology Pre-Procedure Review  Request Date: Requesting Physician:   PATIENT REVIEW QUESTIONS: The patient responded to the following health history questions as indicated:    1. Diabetes Melitis: NO 2. Joint replacements in the past 12 months: NO 3. Major health problems in the past 3 months: NO 4. Has an artificial valve or MVP: NO 5. Has a defibrillator: NO 6. Has been advised in past to take antibiotics in advance of a procedure like teeth cleaning: NO 7. Family history of colon cancer: NO 8. Alcohol Use: NO 9. History of sleep apnea: NO 10. History of coronary artery or other vascular stents placed within the last 12 months: NO    MEDICATIONS & ALLERGIES:    Patient reports the following regarding taking any blood thinners:   Plavix? NO Aspirin? NO Coumadin? NO Brilinta? NO Xarelto? NO Eliquis? NO Pradaxa? NO Savaysa? NO Effient? NO  Patient confirms/reports the following medications:  Current Outpatient Prescriptions  Medication Sig Dispense Refill  . alprazolam (XANAX) 2 MG tablet Take 1 mg by mouth every 6 (six) hours. Scheduled doses May take half tablet    . Cholecalciferol (VITAMIN D3) 2000 units TABS Take 2,000 Units by mouth daily.    . metoprolol succinate (TOPROL-XL) 25 MG 24 hr tablet TAKE 1 TABLET BY MOUTH DAILY - ONLY TAKING 1/2 TABLET AT NIGHT, FILL ONLY WHEN SHE REQUESTS SS 03/27/16 90 tablet 3  . rosuvastatin (CRESTOR) 10 MG tablet Take 10 mg by mouth daily.    Marland Kitchen acetaminophen (TYLENOL) 325 MG tablet Take 2 tablets (650 mg total) by mouth every 6 (six) hours as needed for mild pain, moderate pain, fever or headache. (Patient not taking: Reported on 01/05/2017)    . ondansetron (ZOFRAN) 4 MG tablet Take 1 tablet (4 mg total) by mouth every 8 (eight) hours as needed for nausea or vomiting. (Patient not taking: Reported on 01/05/2017) 15 tablet 0  . saccharomyces boulardii (FLORASTOR) 250 MG capsule Take 1 capsule (250 mg total) by mouth 2 (two) times  daily. (Patient not taking: Reported on 01/05/2017) 60 capsule 0   No current facility-administered medications for this visit.     Patient confirms/reports the following allergies:  Allergies  Allergen Reactions  . Ciprofloxacin Nausea Only  . Codeine Other (See Comments)    Increases anxiety, Makes me feel all whoozie  . Metronidazole Nausea Only  . Morphine And Related Nausea And Vomiting  . Other Other (See Comments)    All antihistamines cause severe insomnia    No orders of the defined types were placed in this encounter.   AUTHORIZATION INFORMATION Primary Insurance: MEDICARE,  ID #: [id number],  Group #:  Pre-Cert / Auth required:  Pre-Cert / Auth #:   Secondary Insurance: BCBS,  ID #[id number],  Group #:  Pre-Cert / Auth required:  Pre-Cert / Auth #:   SCHEDULE INFORMATION: Procedure has been scheduled as follows:  Date: , Time:   Location:   This Gastroenterology Pre-Precedure Review Form is being routed to the following provider(s): R. Garfield Cornea, MD

## 2017-01-05 NOTE — Addendum Note (Signed)
Addended by: Marlou Porch on: 01/05/2017 12:53 PM   Modules accepted: Orders

## 2017-01-05 NOTE — Telephone Encounter (Signed)
Tried to call with no answer  

## 2017-01-06 ENCOUNTER — Other Ambulatory Visit: Payer: Self-pay

## 2017-01-06 DIAGNOSIS — Z1211 Encounter for screening for malignant neoplasm of colon: Secondary | ICD-10-CM

## 2017-01-06 MED ORDER — PEG 3350-KCL-NA BICARB-NACL 420 G PO SOLR: 4000.0000 mL | ORAL | 0 refills | Status: DC

## 2017-01-06 NOTE — Telephone Encounter (Signed)
Appropriate.

## 2017-01-06 NOTE — Telephone Encounter (Signed)
Pt is set up for TCS on 02/11/17 @ 12:0pm. She is aware and instructions are in the mail. NO PA is not needed.

## 2017-02-09 ENCOUNTER — Telehealth: Payer: Self-pay

## 2017-02-09 NOTE — Telephone Encounter (Signed)
Appropriate.

## 2017-02-09 NOTE — Telephone Encounter (Signed)
Gastroenterology Pre-Procedure Review  Request Date: 02/09/2017 Requesting Physician:   PATIENT REVIEW QUESTIONS: The patient responded to the following health history questions as indicated:    Update triage for procedure on 02/11/2017.   1. Diabetes Melitis: no 2. Joint replacements in the past 12 months: no 3. Major health problems in the past 3 months: no 4. Has an artificial valve or MVP: no 5. Has a defibrillator: no 6. Has been advised in past to take antibiotics in advance of a procedure like teeth cleaning: no 7. Family history of colon cancer: no  8. Alcohol Use: no 9. History of sleep apnea: no  10. History of coronary artery or other vascular stents placed within the last 12 months: no    MEDICATIONS & ALLERGIES:    Patient reports the following regarding taking any blood thinners:   Plavix? no Aspirin? no Coumadin? no Brilinta? no Xarelto? no Eliquis? no Pradaxa? no Savaysa? no Effient? no  Patient confirms/reports the following medications:  Current Outpatient Prescriptions  Medication Sig Dispense Refill  . acetaminophen (TYLENOL) 500 MG tablet Take 500 mg by mouth daily as needed for moderate pain or headache.    . alprazolam (XANAX) 2 MG tablet Take 1 mg by mouth every 6 (six) hours.    . ARTIFICIAL TEAR OP Apply 1 drop to eye daily as needed (dry eyes).    . Bisacodyl (LAXATIVE PO) Take 1 tablet by mouth daily as needed (constipation).    . Cholecalciferol (VITAMIN D3) 2000 units TABS Take by mouth daily.     . metoprolol succinate (TOPROL-XL) 25 MG 24 hr tablet TAKE 1 TABLET BY MOUTH DAILY - ONLY TAKING 1/2 TABLET AT NIGHT, FILL ONLY WHEN SHE REQUESTS SS 03/27/16 (Patient taking differently: Take 12.5 mgs by mouth daily in the evening) 90 tablet 3  . ondansetron (ZOFRAN) 4 MG tablet Take 1 tablet (4 mg total) by mouth every 8 (eight) hours as needed for nausea or vomiting. 15 tablet 0  . acetaminophen (TYLENOL) 325 MG tablet Take 2 tablets (650 mg total) by  mouth every 6 (six) hours as needed for mild pain, moderate pain, fever or headache. (Patient not taking: Reported on 02/09/2017)    . Docusate Calcium (STOOL SOFTENER PO) Take 1 tablet by mouth daily as needed (constipation).    . polyethylene glycol-electrolytes (TRILYTE) 420 g solution Take 4,000 mLs by mouth as directed. 4000 mL 0  . saccharomyces boulardii (FLORASTOR) 250 MG capsule Take 1 capsule (250 mg total) by mouth 2 (two) times daily. (Patient not taking: Reported on 01/05/2017) 60 capsule 0   No current facility-administered medications for this visit.     Patient confirms/reports the following allergies:  Allergies  Allergen Reactions  . Ciprofloxacin Nausea Only  . Codeine Other (See Comments)    Increases anxiety, Makes me feel all whoozie  . Metronidazole Nausea Only  . Morphine And Related Nausea And Vomiting  . Other Other (See Comments)    All antihistamines cause severe anxiety    No orders of the defined types were placed in this encounter.   AUTHORIZATION INFORMATION Primary Insurance:   ID #:  Group #:  Pre-Cert / Auth required: Pre-Cert / Auth #:   Secondary Insurance:   ID #:   Group #:  Pre-Cert / Auth required:  Pre-Cert / Auth #:   SCHEDULE INFORMATION: Procedure has been scheduled as follows:  Date:  Time:   Location:   This Gastroenterology Pre-Precedure Review Form is being routed to the  following provider(s): R. Garfield Cornea, MD

## 2017-02-10 ENCOUNTER — Telehealth: Payer: Self-pay

## 2017-02-10 NOTE — Telephone Encounter (Signed)
Open in error

## 2017-02-10 NOTE — Telephone Encounter (Signed)
Noted  

## 2017-02-10 NOTE — Telephone Encounter (Signed)
Pt called back  And said that she is feeling a little bit better and she was going to go on ahead and have the TCS.

## 2017-02-10 NOTE — Telephone Encounter (Signed)
Pt is calling this morning because she is scheduled for a TCS in the morning and she has a sore throat, headache, sick to her stomach. No complaints of a fever at this time. She is wanting to know if she should still have the TCS in the morning. Please advise

## 2017-02-11 ENCOUNTER — Ambulatory Visit (HOSPITAL_COMMUNITY)
Admission: RE | Admit: 2017-02-11 | Discharge: 2017-02-11 | Disposition: A | Discharge status: Home / Self Care | Payer: Medicare Other | Source: Ambulatory Visit | Attending: Internal Medicine | Admitting: Internal Medicine

## 2017-02-11 ENCOUNTER — Telehealth: Payer: Self-pay | Admitting: Internal Medicine

## 2017-02-11 ENCOUNTER — Encounter (HOSPITAL_COMMUNITY)
Admission: RE | Disposition: A | Discharge status: Home / Self Care | Payer: Self-pay | Source: Ambulatory Visit | Attending: Internal Medicine

## 2017-02-11 ENCOUNTER — Encounter (HOSPITAL_COMMUNITY): Payer: Self-pay

## 2017-02-11 DIAGNOSIS — D122 Benign neoplasm of ascending colon: Secondary | ICD-10-CM | POA: Diagnosis not present

## 2017-02-11 DIAGNOSIS — K219 Gastro-esophageal reflux disease without esophagitis: Secondary | ICD-10-CM | POA: Insufficient documentation

## 2017-02-11 DIAGNOSIS — Z881 Allergy status to other antibiotic agents status: Secondary | ICD-10-CM | POA: Insufficient documentation

## 2017-02-11 DIAGNOSIS — Z1211 Encounter for screening for malignant neoplasm of colon: Secondary | ICD-10-CM | POA: Diagnosis not present

## 2017-02-11 DIAGNOSIS — K5730 Diverticulosis of large intestine without perforation or abscess without bleeding: Secondary | ICD-10-CM | POA: Diagnosis not present

## 2017-02-11 DIAGNOSIS — Z885 Allergy status to narcotic agent status: Secondary | ICD-10-CM | POA: Diagnosis not present

## 2017-02-11 DIAGNOSIS — F410 Panic disorder [episodic paroxysmal anxiety] without agoraphobia: Secondary | ICD-10-CM | POA: Insufficient documentation

## 2017-02-11 DIAGNOSIS — E785 Hyperlipidemia, unspecified: Secondary | ICD-10-CM | POA: Diagnosis not present

## 2017-02-11 DIAGNOSIS — Z86010 Personal history of colonic polyps: Secondary | ICD-10-CM | POA: Diagnosis not present

## 2017-02-11 DIAGNOSIS — Z800 Family history of malignant neoplasm of digestive organs: Secondary | ICD-10-CM | POA: Diagnosis not present

## 2017-02-11 HISTORY — PX: COLONOSCOPY: SHX5424

## 2017-02-11 SURGERY — COLONOSCOPY
Anesthesia: Moderate Sedation

## 2017-02-11 MED ORDER — SIMETHICONE 40 MG/0.6ML PO SUSP
ORAL | Status: DC | PRN
  Administered 2017-02-11: 11:00:00

## 2017-02-11 MED ORDER — MEPERIDINE HCL 100 MG/ML IJ SOLN
INTRAMUSCULAR | Status: DC | PRN
  Administered 2017-02-11: 25 mg via INTRAVENOUS
  Administered 2017-02-11: 50 mg via INTRAVENOUS
  Administered 2017-02-11: 25 mg via INTRAVENOUS

## 2017-02-11 MED ORDER — MIDAZOLAM HCL 5 MG/5ML IJ SOLN
INTRAMUSCULAR | Status: AC
  Filled 2017-02-11: qty 10

## 2017-02-11 MED ORDER — SODIUM CHLORIDE 0.9 % IV SOLN
INTRAVENOUS | Status: DC
  Administered 2017-02-11: 11:00:00 via INTRAVENOUS

## 2017-02-11 MED ORDER — ONDANSETRON HCL 4 MG/2ML IJ SOLN
INTRAMUSCULAR | Status: AC
  Filled 2017-02-11: qty 2

## 2017-02-11 MED ORDER — MIDAZOLAM HCL 5 MG/5ML IJ SOLN
INTRAMUSCULAR | Status: DC | PRN
  Administered 2017-02-11 (×2): 2 mg via INTRAVENOUS
  Administered 2017-02-11: 1 mg via INTRAVENOUS

## 2017-02-11 MED ORDER — MEPERIDINE HCL 100 MG/ML IJ SOLN
INTRAMUSCULAR | Status: DC
Start: 2017-02-11 — End: 2017-02-11
  Filled 2017-02-11: qty 2

## 2017-02-11 MED ORDER — ONDANSETRON HCL 4 MG/2ML IJ SOLN
INTRAMUSCULAR | Status: DC | PRN
Start: 2017-02-11 — End: 2017-02-11
  Administered 2017-02-11: 4 mg via INTRAVENOUS

## 2017-02-11 NOTE — Op Note (Signed)
[facility] Patient Name: Alexa Wilson Procedure Date: 02/11/2017 10:18 AM MRN: [id number] Date of Birth: 09-25-1946 Attending MD: Gennette Pac , MD CSN: [id number] Age: 71 Admit Type: Outpatient Procedure:                Colonoscopy with multiple snare polypecdtomies Indications:              High risk colon cancer surveillance: Personal                            history of colonic polyps Providers:                Gennette Pac, MD, Judee Clara, RN, Dyann Ruddle Referring MD:              Medicines:                Midazolam 5 mg IV, Meperidine 100 mg IV,                            Ondansetron 4 mg IV Complications:            No immediate complications. Estimated Blood Loss:     Estimated blood loss was minimal. Procedure:                Pre-Anesthesia Assessment:                           - Prior to the procedure, a History and Physical                            was performed, and patient medications and                            allergies were reviewed. The patient's tolerance of                            previous anesthesia was also reviewed. The risks                            and benefits of the procedure and the sedation                            options and risks were discussed with the patient.                            All questions were answered, and informed consent                            was obtained. Prior Anticoagulants: The patient has                            taken no previous anticoagulant or antiplatelet  agents. ASA Grade Assessment: II - A patient with                            mild systemic disease. After reviewing the risks                            and benefits, the patient was deemed in                            satisfactory condition to undergo the procedure.                           After obtaining informed consent, the colonoscope                            was  passed under direct vision. Throughout the                            procedure, the patient's blood pressure, pulse, and                            oxygen saturations were monitored continuously. The                            EC-[id number] ([id number]) scope was introduced through                            the anus and advanced to the the cecum, identified                            by appendiceal orifice and ileocecal valve. The                            colonoscopy was performed without difficulty. The                            patient tolerated the procedure well. The entire                            colon was well visualized. The quality of the bowel                            preparation was adequate. The ileocecal valve,                            appendiceal orifice, and rectum were photographed. Scope In: 11:17:15 AM Scope Out: 11:32:38 AM Scope Withdrawal Time: 0 hours 8 minutes 27 seconds  Total Procedure Duration: 0 hours 15 minutes 23 seconds  Findings:      The perianal and digital rectal examinations were normal.      Multiple small and large-mouthed diverticula were found in the sigmoid       colon and descending colon.      Two semi-pedunculated polyps were found in the ascending colon. The  polyps were 4 to 6 mm in size. These polyps were removed with a cold       snare. Resection and retrieval were complete. Estimated blood loss was       minimal. Estimated blood loss was minimal.      The exam was otherwise without abnormality on direct and retroflexion       views. Impression:               - Diverticulosis in the sigmoid colon and in the                            descending colon.                           - Two 4 to 6 mm polyps in the ascending colon,                            removed with a cold snare. Resected and retrieved.                           - The examination was otherwise normal on direct                            and retroflexion  views. Moderate Sedation:      Moderate (conscious) sedation was administered by the endoscopy nurse       and supervised by the endoscopist. The following parameters were       monitored: oxygen saturation, heart rate, blood pressure, respiratory       rate, EKG, adequacy of pulmonary ventilation, and response to care.       Total physician intraservice time was 21 minutes. Recommendation:           - Patient has a contact number available for                            emergencies. The signs and symptoms of potential                            delayed complications were discussed with the                            patient. Return to normal activities tomorrow.                            Written discharge instructions were provided to the                            patient.                           - Resume previous diet.                           - Continue present medications.                           - Await pathology results.                           -  Repeat colonoscopy date to be determined after                            pending pathology results are reviewed for                            surveillance based on pathology results.                           - Return to GI clinic (date not yet determined). Procedure Code(s):        --- Professional ---                           [phone], Colonoscopy, flexible; with removal of                            tumor(s), polyp(s), or other lesion(s) by snare                            technique                           99152, Moderate sedation services provided by the                            same physician or other qualified health care                            professional performing the diagnostic or                            therapeutic service that the sedation supports,                            requiring the presence of an independent trained                            observer to assist in the monitoring of the                             patient's level of consciousness and physiological                            status; initial 15 minutes of intraservice time,                            patient age 60 years or older Diagnosis Code(s):        --- Professional ---                           Z86.010, Personal history of colonic polyps                           D12.2, Benign neoplasm of ascending colon  K57.30, Diverticulosis of large intestine without                            perforation or abscess without bleeding CPT copyright 2016 American Medical Association. All rights reserved. The codes documented in this report are preliminary and upon coder review may  be revised to meet current compliance requirements. Gerrit Friends. Ayala Ribble, MD Gennette Pac, MD 02/11/2017 11:42:18 AM This report has been signed electronically. Number of Addenda: 0

## 2017-02-11 NOTE — Telephone Encounter (Signed)
Pt called to say that she had her colonoscopy today at noon and she didn't get to speak with RMR before she was discharged. She also wants pictures of what was done today because she keeps a scrap book and was told to call our office. I told her that I could print her off a procedure report that has pictures on it, but anything else would probably be at the hospital. She said that she would come by one day to sign release and pick up copy. I told her that she would be getting a letter from RMR or a call from his nurse when her results were available. I also told her that her son that was with her today was given the discharge instructions and RMR spoke with him. She doesn't remember.

## 2017-02-11 NOTE — Telephone Encounter (Signed)
noted 

## 2017-02-11 NOTE — Discharge Instructions (Signed)
Colonoscopy Discharge Instructions  Read the instructions outlined below and refer to this sheet in the next few weeks. These discharge instructions provide you with general information on caring for yourself after you leave the hospital. Your doctor may also give you specific instructions. While your treatment has been planned according to the most current medical practices available, unavoidable complications occasionally occur. If you have any problems or questions after discharge, call Dr. Gala Romney at [phone]. ACTIVITY  You may resume your regular activity, but move at a slower pace for the next 24 hours.   Take frequent rest periods for the next 24 hours.   Walking will help get rid of the air and reduce the bloated feeling in your belly (abdomen).   No driving for 24 hours (because of the medicine (anesthesia) used during the test).    Do not sign any important legal documents or operate any machinery for 24 hours (because of the anesthesia used during the test).  NUTRITION  Drink plenty of fluids.   You may resume your normal diet as instructed by your doctor.   Begin with a light meal and progress to your normal diet. Heavy or fried foods are harder to digest and may make you feel sick to your stomach (nauseated).   Avoid alcoholic beverages for 24 hours or as instructed.  MEDICATIONS  You may resume your normal medications unless your doctor tells you otherwise.  WHAT YOU CAN EXPECT TODAY  Some feelings of bloating in the abdomen.   Passage of more gas than usual.   Spotting of blood in your stool or on the toilet paper.  IF YOU HAD POLYPS REMOVED DURING THE COLONOSCOPY:  No aspirin products for 7 days or as instructed.   No alcohol for 7 days or as instructed.   Eat a soft diet for the next 24 hours.  FINDING OUT THE RESULTS OF YOUR TEST Not all test results are available during your visit. If your test results are not back during the visit, make an appointment  with your caregiver to find out the results. Do not assume everything is normal if you have not heard from your caregiver or the medical facility. It is important for you to follow up on all of your test results.  SEEK IMMEDIATE MEDICAL ATTENTION IF:  You have more than a spotting of blood in your stool.   Your belly is swollen (abdominal distention).   You are nauseated or vomiting.   You have a temperature over 101.   You have abdominal pain or discomfort that is severe or gets worse throughout the day.     Colon polyp and diverticulosis information provided  Further recommendations to follow pending review of pathology report    Colon Polyps Polyps are tissue growths inside the body. Polyps can grow in many places, including the large intestine (colon). A polyp may be a round bump or a mushroom-shaped growth. You could have one polyp or several. Most colon polyps are noncancerous (benign). However, some colon polyps can become cancerous over time. What are the causes? The exact cause of colon polyps is not known. What increases the risk? This condition is more likely to develop in people who:  Have a family history of colon cancer or colon polyps.  Are older than 63 or older than 45 if they are African American.  Have inflammatory bowel disease, such as ulcerative colitis or Crohn disease.  Are overweight.  Smoke cigarettes.  Do not get enough exercise.  Drink too much alcohol.  Eat a diet that is:  High in fat and red meat.  Low in fiber.  Had childhood cancer that was treated with abdominal radiation. What are the signs or symptoms? Most polyps do not cause symptoms. If you have symptoms, they may include:  Blood coming from your rectum when having a bowel movement.  Blood in your stool.The stool may look dark red or black.  A change in bowel habits, such as constipation or diarrhea. How is this diagnosed? This condition is diagnosed with a  colonoscopy. This is a procedure that uses a lighted, flexible scope to look at the inside of your colon. How is this treated? Treatment for this condition involves removing any polyps that are found. Those polyps will then be tested for cancer. If cancer is found, your health care provider will talk to you about options for colon cancer treatment. Follow these instructions at home: Diet   Eat plenty of fiber, such as fruits, vegetables, and whole grains.  Eat foods that are high in calcium and vitamin D, such as milk, cheese, yogurt, eggs, liver, fish, and broccoli.  Limit foods high in fat, red meats, and processed meats, such as hot dogs, sausage, bacon, and lunch meats.  Maintain a healthy weight, or lose weight if recommended by your health care provider. General instructions   Do not smoke cigarettes.  Do not drink alcohol excessively.  Keep all follow-up visits as told by your health care provider. This is important. This includes keeping regularly scheduled colonoscopies. Talk to your health care provider about when you need a colonoscopy.  Exercise every day or as told by your health care provider. Contact a health care provider if:  You have new or worsening bleeding during a bowel movement.  You have new or increased blood in your stool.  You have a change in bowel habits.  You unexpectedly lose weight. This information is not intended to replace advice given to you by your health care provider. Make sure you discuss any questions you have with your health care provider. Document Released: 07/15/2004 Document Revised: 03/26/2016 Document Reviewed: 09/09/2015 Elsevier Interactive Patient Education  2017 [employer]    Diverticulosis Diverticulosis is a condition that develops when small pouches (diverticula) form in the wall of the large intestine (colon). The colon is where water is absorbed and stool is formed. The pouches form when the inside layer of the colon  pushes through weak spots in the outer layers of the colon. You may have a few pouches or many of them. What are the causes? The cause of this condition is not known. What increases the risk? The following factors may make you more likely to develop this condition:  Being older than age 73. Your risk for this condition increases with age. Diverticulosis is rare among people younger than age 73. By age 86, many people have it.  Eating a low-fiber diet.  Having frequent constipation.  Being overweight.  Not getting enough exercise.  Smoking.  Taking over-the-counter pain medicines, like aspirin and ibuprofen.  Having a family history of diverticulosis. What are the signs or symptoms? In most people, there are no symptoms of this condition. If you do have symptoms, they may include:  Bloating.  Cramps in the abdomen.  Constipation or diarrhea.  Pain in the lower left side of the abdomen. How is this diagnosed? This condition is most often diagnosed during an exam for other colon problems. Because diverticulosis usually has  no symptoms, it often cannot be diagnosed independently. This condition may be diagnosed by:  Using a flexible scope to examine the colon (colonoscopy).  Taking an X-ray of the colon after dye has been put into the colon (barium enema).  Doing a CT scan. How is this treated? You may not need treatment for this condition if you have never developed an infection related to diverticulosis. If you have had an infection before, treatment may include:  Eating a high-fiber diet. This may include eating more fruits, vegetables, and grains.  Taking a fiber supplement.  Taking a live bacteria supplement (probiotic).  Taking medicine to relax your colon.  Taking antibiotic medicines. Follow these instructions at home:  Drink 6-8 glasses of water or more each day to prevent constipation.  Try not to strain when you have a bowel movement.  If you have had  an infection before:  Eat more fiber as directed by your health care provider or your diet and nutrition specialist (dietitian).  Take a fiber supplement or probiotic, if your health care provider approves.  Take over-the-counter and prescription medicines only as told by your health care provider.  If you were prescribed an antibiotic, take it as told by your health care provider. Do not stop taking the antibiotic even if you start to feel better.  Keep all follow-up visits as told by your health care provider. This is important. Contact a health care provider if:  You have pain in your abdomen.  You have bloating.  You have cramps.  You have not had a bowel movement in 3 days. Get help right away if:  Your pain gets worse.  Your bloating becomes very bad.  You have a fever or chills, and your symptoms suddenly get worse.  You vomit.  You have bowel movements that are bloody or black.  You have bleeding from your rectum. Summary  Diverticulosis is a condition that develops when small pouches (diverticula) form in the wall of the large intestine (colon).  You may have a few pouches or many of them.  This condition is most often diagnosed during an exam for other colon problems.  If you have had an infection related to diverticulosis, treatment may include increasing the fiber in your diet, taking supplements, or taking medicines. This information is not intended to replace advice given to you by your health care provider. Make sure you discuss any questions you have with your health care provider. Document Released: 07/16/2004 Document Revised: 09/07/2016 Document Reviewed: 09/07/2016 Elsevier Interactive Patient Education  2017 [employer].

## 2017-02-11 NOTE — H&P (Signed)
@LOGO @   Primary Care Physician:  Glenda Chroman, MD Primary Gastroenterologist:  Dr. Gala Romney  Pre-Procedure History & Physical: HPI:  Alexa Wilson is a 71 y.o. female here for surveillance colonoscopy. History of colonic adenoma; removed 6 years ago. Positive family history colon cancer. Currently, no bowel symptoms. Here for surveillance examination.  Past Medical History:  Diagnosis Date  . Agoraphobia with panic disorder 1982  . Brown recluse spider bite   . Constipation   . Diverticulitis   . GERD (gastroesophageal reflux disease)   . Hyperlipidemia   . Lyme disease   . Migraines    "don't have them since menopause" (09/23/2016)  . Panic disorder   . Shingles     Past Surgical History:  Procedure Laterality Date  . APPENDECTOMY  1964  . CHOLECYSTECTOMY OPEN  1966  . COLONOSCOPY  2012   RMR: 1. External hemorrhoids, otherwise normal rectum. 2. Left-sided diverticula status post snare polypectomy left colon polyp. Remainder of the colonic mucosa and terminal ileum mucoa appeared normal.   . DILATION AND CURETTAGE OF UTERUS    . TONSILLECTOMY  1961    Prior to Admission medications   Medication Sig Start Date End Date Taking? Authorizing Provider  acetaminophen (TYLENOL) 500 MG tablet Take 500 mg by mouth daily as needed for moderate pain or headache.   Yes Historical Provider, MD  alprazolam Duanne Moron) 2 MG tablet Take 1 mg by mouth every 6 (six) hours.   Yes Historical Provider, MD  ARTIFICIAL TEAR OP Apply 1 drop to eye daily as needed (dry eyes).   Yes Historical Provider, MD  Bisacodyl (LAXATIVE PO) Take 1 tablet by mouth daily as needed (constipation).   Yes Historical Provider, MD  Cholecalciferol (VITAMIN D3) 2000 units TABS Take by mouth daily.    Yes Historical Provider, MD  Docusate Calcium (STOOL SOFTENER PO) Take 1 tablet by mouth daily as needed (constipation).   Yes Historical Provider, MD  metoprolol succinate (TOPROL-XL) 25 MG 24 hr tablet TAKE 1 TABLET BY  MOUTH DAILY - ONLY TAKING 1/2 TABLET AT NIGHT, FILL ONLY WHEN SHE REQUESTS SS 03/27/16 Patient taking differently: Take 12.5 mgs by mouth daily in the evening 12/24/16  Yes Lelon Perla, MD  polyethylene glycol-electrolytes (TRILYTE) 420 g solution Take 4,000 mLs by mouth as directed. 01/06/17  Yes Daneil Dolin, MD  acetaminophen (TYLENOL) 325 MG tablet Take 2 tablets (650 mg total) by mouth every 6 (six) hours as needed for mild pain, moderate pain, fever or headache. Patient not taking: Reported on 02/09/2017 09/27/16   Modena Jansky, MD  ondansetron (ZOFRAN) 4 MG tablet Take 1 tablet (4 mg total) by mouth every 8 (eight) hours as needed for nausea or vomiting. 09/27/16   Modena Jansky, MD  saccharomyces boulardii (FLORASTOR) 250 MG capsule Take 1 capsule (250 mg total) by mouth 2 (two) times daily. Patient not taking: Reported on 01/05/2017 09/27/16   Modena Jansky, MD    Allergies as of 01/06/2017 - Review Complete 01/05/2017  Allergen Reaction Noted  . Ciprofloxacin Nausea Only   . Codeine Other (See Comments)   . Metronidazole Nausea Only   . Morphine and related Nausea And Vomiting 05/11/2013  . Other Other (See Comments) 07/14/2012    Family History  Problem Relation Age of Onset  . Diabetes Mother   . Hypertension Mother   . Heart attack Mother   . Cancer Father     Melanoma   . Diabetes Brother   .  Diabetes Son     Social History   Social History  . Marital status: Widowed    Spouse name: N/A  . Number of children: 5  . Years of education: N/A   Occupational History  . Not on file.   Social History Main Topics  . Smoking status: Never Smoker  . Smokeless tobacco: Never Used  . Alcohol use No  . Drug use: No  . Sexual activity: No   Other Topics Concern  . Not on file   Social History Narrative  . No narrative on file    Review of Systems: See HPI, otherwise negative ROS  Physical Exam: BP (!) 142/79   Pulse 92   Temp 98.1 F (36.7 C)  (Oral)   Resp 13   Ht 5\' 6"  (1.676 m)   Wt 180 lb (81.6 kg)   SpO2 93%   BMI 29.05 kg/m  General:   Alert,  Well-developed, well-nourished, pleasant and cooperative in NAD Neck:  Supple; no masses or thyromegaly. No significant cervical adenopathy. Lungs:  Clear throughout to auscultation.   No wheezes, crackles, or rhonchi. No acute distress. Heart:  Regular rate and rhythm; no murmurs, clicks, rubs,  or gallops. Abdomen: Non-distended, normal bowel sounds.  Soft and nontender without appreciable mass or hepatosplenomegaly.  Pulses:  Normal pulses noted. Extremities:  Without clubbing or edema.  Impression:  Pleasant 71 year old lady with a positive family history of colon cancer personal history of colonic polyps. Here for some as examination. I have offered the patient a surveillance colonoscopy today.   The risks, benefits, limitations, alternatives and imponderables have been reviewed with the patient. Questions have been answered. All parties are agreeable.   Notice: This dictation was prepared with Dragon dictation along with smaller phrase technology. Any transcriptional errors that result from this process are unintentional and may not be corrected upon review.

## 2017-02-13 ENCOUNTER — Encounter: Payer: Self-pay | Admitting: Internal Medicine

## 2017-02-15 ENCOUNTER — Encounter (HOSPITAL_COMMUNITY): Payer: Self-pay | Admitting: Internal Medicine

## 2017-02-16 DIAGNOSIS — F410 Panic disorder [episodic paroxysmal anxiety] without agoraphobia: Secondary | ICD-10-CM | POA: Diagnosis not present

## 2017-02-16 DIAGNOSIS — M1990 Unspecified osteoarthritis, unspecified site: Secondary | ICD-10-CM | POA: Diagnosis not present

## 2017-02-16 DIAGNOSIS — Z6829 Body mass index (BMI) 29.0-29.9, adult: Secondary | ICD-10-CM | POA: Diagnosis not present

## 2017-02-16 DIAGNOSIS — Z789 Other specified health status: Secondary | ICD-10-CM | POA: Diagnosis not present

## 2017-02-16 DIAGNOSIS — Z299 Encounter for prophylactic measures, unspecified: Secondary | ICD-10-CM | POA: Diagnosis not present

## 2017-02-16 DIAGNOSIS — E7800 Pure hypercholesterolemia, unspecified: Secondary | ICD-10-CM | POA: Diagnosis not present

## 2017-03-12 DIAGNOSIS — Z6829 Body mass index (BMI) 29.0-29.9, adult: Secondary | ICD-10-CM | POA: Diagnosis not present

## 2017-03-12 DIAGNOSIS — F419 Anxiety disorder, unspecified: Secondary | ICD-10-CM | POA: Diagnosis not present

## 2017-03-12 DIAGNOSIS — J209 Acute bronchitis, unspecified: Secondary | ICD-10-CM | POA: Diagnosis not present

## 2017-03-12 DIAGNOSIS — F329 Major depressive disorder, single episode, unspecified: Secondary | ICD-10-CM | POA: Diagnosis not present

## 2017-03-12 DIAGNOSIS — E7800 Pure hypercholesterolemia, unspecified: Secondary | ICD-10-CM | POA: Diagnosis not present

## 2017-03-12 DIAGNOSIS — Z789 Other specified health status: Secondary | ICD-10-CM | POA: Diagnosis not present

## 2017-03-12 DIAGNOSIS — Z299 Encounter for prophylactic measures, unspecified: Secondary | ICD-10-CM | POA: Diagnosis not present

## 2017-04-14 DIAGNOSIS — N390 Urinary tract infection, site not specified: Secondary | ICD-10-CM | POA: Diagnosis not present

## 2017-04-14 DIAGNOSIS — E7800 Pure hypercholesterolemia, unspecified: Secondary | ICD-10-CM | POA: Diagnosis not present

## 2017-04-14 DIAGNOSIS — Z299 Encounter for prophylactic measures, unspecified: Secondary | ICD-10-CM | POA: Diagnosis not present

## 2017-04-14 DIAGNOSIS — F419 Anxiety disorder, unspecified: Secondary | ICD-10-CM | POA: Diagnosis not present

## 2017-04-14 DIAGNOSIS — R350 Frequency of micturition: Secondary | ICD-10-CM | POA: Diagnosis not present

## 2017-04-14 DIAGNOSIS — Z6829 Body mass index (BMI) 29.0-29.9, adult: Secondary | ICD-10-CM | POA: Diagnosis not present

## 2017-04-27 DIAGNOSIS — M546 Pain in thoracic spine: Secondary | ICD-10-CM | POA: Diagnosis not present

## 2017-04-27 DIAGNOSIS — R1032 Left lower quadrant pain: Secondary | ICD-10-CM | POA: Diagnosis not present

## 2017-04-27 DIAGNOSIS — M545 Low back pain: Secondary | ICD-10-CM | POA: Diagnosis not present

## 2017-04-27 DIAGNOSIS — R109 Unspecified abdominal pain: Secondary | ICD-10-CM | POA: Diagnosis not present

## 2017-04-27 DIAGNOSIS — M549 Dorsalgia, unspecified: Secondary | ICD-10-CM | POA: Diagnosis not present

## 2017-04-28 DIAGNOSIS — Z79899 Other long term (current) drug therapy: Secondary | ICD-10-CM | POA: Diagnosis not present

## 2017-04-28 DIAGNOSIS — R1032 Left lower quadrant pain: Secondary | ICD-10-CM | POA: Diagnosis not present

## 2017-04-30 DIAGNOSIS — K449 Diaphragmatic hernia without obstruction or gangrene: Secondary | ICD-10-CM | POA: Diagnosis not present

## 2017-04-30 DIAGNOSIS — K760 Fatty (change of) liver, not elsewhere classified: Secondary | ICD-10-CM | POA: Diagnosis not present

## 2017-04-30 DIAGNOSIS — I700 Atherosclerosis of aorta: Secondary | ICD-10-CM | POA: Diagnosis not present

## 2017-04-30 DIAGNOSIS — R1032 Left lower quadrant pain: Secondary | ICD-10-CM | POA: Diagnosis not present

## 2017-05-03 DIAGNOSIS — W1839XA Other fall on same level, initial encounter: Secondary | ICD-10-CM | POA: Diagnosis not present

## 2017-05-03 DIAGNOSIS — M79671 Pain in right foot: Secondary | ICD-10-CM | POA: Diagnosis not present

## 2017-05-03 DIAGNOSIS — S99921A Unspecified injury of right foot, initial encounter: Secondary | ICD-10-CM | POA: Diagnosis not present

## 2017-05-03 DIAGNOSIS — S99911A Unspecified injury of right ankle, initial encounter: Secondary | ICD-10-CM | POA: Diagnosis not present

## 2017-05-03 DIAGNOSIS — S92901A Unspecified fracture of right foot, initial encounter for closed fracture: Secondary | ICD-10-CM | POA: Diagnosis not present

## 2017-05-03 DIAGNOSIS — M25561 Pain in right knee: Secondary | ICD-10-CM | POA: Diagnosis not present

## 2017-05-03 DIAGNOSIS — M25462 Effusion, left knee: Secondary | ICD-10-CM | POA: Diagnosis not present

## 2017-05-03 DIAGNOSIS — Z79899 Other long term (current) drug therapy: Secondary | ICD-10-CM | POA: Diagnosis not present

## 2017-05-03 DIAGNOSIS — M25562 Pain in left knee: Secondary | ICD-10-CM | POA: Diagnosis not present

## 2017-05-03 DIAGNOSIS — M25571 Pain in right ankle and joints of right foot: Secondary | ICD-10-CM | POA: Diagnosis not present

## 2017-05-04 DIAGNOSIS — Z299 Encounter for prophylactic measures, unspecified: Secondary | ICD-10-CM | POA: Diagnosis not present

## 2017-05-04 DIAGNOSIS — M25579 Pain in unspecified ankle and joints of unspecified foot: Secondary | ICD-10-CM | POA: Diagnosis not present

## 2017-05-04 DIAGNOSIS — E876 Hypokalemia: Secondary | ICD-10-CM | POA: Diagnosis not present

## 2017-05-07 DIAGNOSIS — S93609D Unspecified sprain of unspecified foot, subsequent encounter: Secondary | ICD-10-CM | POA: Diagnosis not present

## 2017-05-13 DIAGNOSIS — E7800 Pure hypercholesterolemia, unspecified: Secondary | ICD-10-CM | POA: Diagnosis not present

## 2017-05-13 DIAGNOSIS — E785 Hyperlipidemia, unspecified: Secondary | ICD-10-CM | POA: Diagnosis not present

## 2017-05-13 DIAGNOSIS — Z299 Encounter for prophylactic measures, unspecified: Secondary | ICD-10-CM | POA: Diagnosis not present

## 2017-05-13 DIAGNOSIS — L259 Unspecified contact dermatitis, unspecified cause: Secondary | ICD-10-CM | POA: Diagnosis not present

## 2017-05-13 DIAGNOSIS — Z6829 Body mass index (BMI) 29.0-29.9, adult: Secondary | ICD-10-CM | POA: Diagnosis not present

## 2017-05-13 DIAGNOSIS — F329 Major depressive disorder, single episode, unspecified: Secondary | ICD-10-CM | POA: Diagnosis not present

## 2017-05-18 DIAGNOSIS — R21 Rash and other nonspecific skin eruption: Secondary | ICD-10-CM | POA: Diagnosis not present

## 2017-05-25 DIAGNOSIS — R21 Rash and other nonspecific skin eruption: Secondary | ICD-10-CM | POA: Diagnosis not present

## 2017-05-28 DIAGNOSIS — I7300 Raynaud's syndrome without gangrene: Secondary | ICD-10-CM | POA: Diagnosis not present

## 2017-05-28 DIAGNOSIS — F419 Anxiety disorder, unspecified: Secondary | ICD-10-CM | POA: Diagnosis not present

## 2017-05-28 DIAGNOSIS — Z299 Encounter for prophylactic measures, unspecified: Secondary | ICD-10-CM | POA: Diagnosis not present

## 2017-05-28 DIAGNOSIS — Z6829 Body mass index (BMI) 29.0-29.9, adult: Secondary | ICD-10-CM | POA: Diagnosis not present

## 2017-05-28 DIAGNOSIS — R21 Rash and other nonspecific skin eruption: Secondary | ICD-10-CM | POA: Diagnosis not present

## 2017-05-28 DIAGNOSIS — E7800 Pure hypercholesterolemia, unspecified: Secondary | ICD-10-CM | POA: Diagnosis not present

## 2017-06-21 ENCOUNTER — Emergency Department (HOSPITAL_COMMUNITY): Payer: Medicare Other

## 2017-06-21 ENCOUNTER — Encounter (HOSPITAL_COMMUNITY): Payer: Self-pay

## 2017-06-21 ENCOUNTER — Emergency Department (HOSPITAL_COMMUNITY)
Admission: EM | Admit: 2017-06-21 | Discharge: 2017-06-21 | Disposition: A | Discharge status: Home / Self Care | Payer: Medicare Other | Attending: Emergency Medicine | Admitting: Emergency Medicine

## 2017-06-21 DIAGNOSIS — K5792 Diverticulitis of intestine, part unspecified, without perforation or abscess without bleeding: Secondary | ICD-10-CM | POA: Diagnosis not present

## 2017-06-21 DIAGNOSIS — Z9049 Acquired absence of other specified parts of digestive tract: Secondary | ICD-10-CM | POA: Diagnosis not present

## 2017-06-21 DIAGNOSIS — Z79899 Other long term (current) drug therapy: Secondary | ICD-10-CM | POA: Insufficient documentation

## 2017-06-21 DIAGNOSIS — R1032 Left lower quadrant pain: Secondary | ICD-10-CM | POA: Diagnosis present

## 2017-06-21 DIAGNOSIS — K5732 Diverticulitis of large intestine without perforation or abscess without bleeding: Secondary | ICD-10-CM | POA: Insufficient documentation

## 2017-06-21 LAB — COMPREHENSIVE METABOLIC PANEL
ALBUMIN: 4.1 g/dL (ref 3.5–5.0)
ALT: 18 U/L (ref 14–54)
ANION GAP: 8 (ref 5–15)
AST: 24 U/L (ref 15–41)
Alkaline Phosphatase: 79 U/L (ref 38–126)
BUN: 10 mg/dL (ref 6–20)
CHLORIDE: 105 mmol/L (ref 101–111)
CO2: 28 mmol/L (ref 22–32)
Calcium: 9.5 mg/dL (ref 8.9–10.3)
Creatinine, Ser: 0.87 mg/dL (ref 0.44–1.00)
GFR calc Af Amer: >60 mL/min (ref >60)
GFR calc non Af Amer: >60 mL/min (ref >60)
GLUCOSE: 100 mg/dL — AB (ref 65–99)
POTASSIUM: 3.8 mmol/L (ref 3.5–5.1)
SODIUM: 141 mmol/L (ref 135–145)
Total Bilirubin: 1.2 mg/dL (ref 0.3–1.2)
Total Protein: 7.3 g/dL (ref 6.5–8.1)

## 2017-06-21 LAB — CBC
HEMATOCRIT: 39.8 % (ref 36.0–46.0)
HEMOGLOBIN: 13.1 g/dL (ref 12.0–15.0)
MCH: 32.8 pg (ref 26.0–34.0)
MCHC: 32.9 g/dL (ref 30.0–36.0)
MCV: 99.5 fL (ref 78.0–100.0)
Platelets: 291 10*3/uL (ref 150–400)
RBC: 4 MIL/uL (ref 3.87–5.11)
RDW: 13.5 % (ref 11.5–15.5)
WBC: 7.6 10*3/uL (ref 4.0–10.5)

## 2017-06-21 LAB — LIPASE, BLOOD: Lipase: 37 U/L (ref 11–51)

## 2017-06-21 MED ORDER — AMOXICILLIN-POT CLAVULANATE 875-125 MG PO TABS: 1.0000 | ORAL_TABLET | Freq: Two times a day (BID) | ORAL | 0 refills | Status: AC

## 2017-06-21 MED ORDER — HYDROCODONE-ACETAMINOPHEN 5-325 MG PO TABS: 1.0000 | ORAL_TABLET | Freq: Four times a day (QID) | ORAL | 0 refills | Status: DC | PRN

## 2017-06-21 MED ORDER — IOPAMIDOL (ISOVUE-300) INJECTION 61%
INTRAVENOUS | Status: AC
  Administered 2017-06-21: 100 mL via INTRAVENOUS
  Filled 2017-06-21: qty 100

## 2017-06-21 NOTE — ED Notes (Signed)
MD At the bedside

## 2017-06-21 NOTE — ED Notes (Signed)
Pt taken to CT.

## 2017-06-21 NOTE — ED Triage Notes (Signed)
Pt reports LLQ abd pain radiating into her back X2 days. She denies n/v. Pt does report diarrhea. Pt also reports red spots on her skin intermittently. Denies any new medications or detergents/soaps. Pt aoX4. Skin warm and dry.

## 2017-06-21 NOTE — ED Notes (Signed)
Attempted IV x1. Unable to access.  

## 2017-06-21 NOTE — ED Provider Notes (Signed)
[facility] DEPT Provider Note   CSN: [id number] Arrival date & time: 06/21/17  1144     History   Chief Complaint Chief Complaint  Patient presents with  . Abdominal Pain    HPI Alexa Wilson is a 71 y.o. female.  HPI   Patient is 71 year old female presenting with left lower quadrant pain. Patient reports feels similar to her diverticulitis except maybe a little bit worse. Patient reports that her primary care sent her here for evaluation. Patient is also concerned that she has occasional red spots on her skin. She reports she broke her foot 2 months ago. Since then she has little red spots that come and go. She reports they come and go all over her body at different times. They're mildly itchy.  Past Medical History:  Diagnosis Date  . Agoraphobia with panic disorder 1982  . Brown recluse spider bite   . Constipation   . Diverticulitis   . GERD (gastroesophageal reflux disease)   . Hyperlipidemia   . Lyme disease   . Migraines    "don't have them since menopause" (09/23/2016)  . Panic disorder   . Shingles     Patient Active Problem List   Diagnosis Date Noted  . Diverticulitis 09/22/2016  . Acute hypokalemia 09/22/2016  . Hyperlipidemia 11/15/2015  . Constipation 04/09/2015  . Palpitations 08/11/2013  . Chest pressure 07/15/2013  . Right arm weakness 07/15/2013  . Sigmoid diverticulitis 07/14/2012  . DYSPEPSIA 09/06/2009  . IBS 09/06/2009  . COLONIC POLYPS, HX OF 09/06/2009  . WEIGHT LOSS 06/07/2009  . NAUSEA AND VOMITING 06/07/2009  . ABDOMINAL PAIN 06/07/2009  . EPIGASTRIC PAIN 06/07/2009  . COLONIC POLYPS 02/21/2009  . DYSPHAGIA 02/21/2009  . PANIC DISORDER 02/20/2009  . CATARACTS 02/20/2009  . DIVERTICULOSIS OF COLON 02/20/2009  . HELICOBACTER PYLORI GASTRITIS, HX OF 02/20/2009    Past Surgical History:  Procedure Laterality Date  . APPENDECTOMY  1964  . CHOLECYSTECTOMY OPEN  1966  . COLONOSCOPY  2012   RMR: 1. External hemorrhoids,  otherwise normal rectum. 2. Left-sided diverticula status post snare polypectomy left colon polyp. Remainder of the colonic mucosa and terminal ileum mucoa appeared normal.   . COLONOSCOPY N/A 02/11/2017   Procedure: COLONOSCOPY;  Surgeon: Daneil Dolin, MD;  Location: [facility];  Service: Endoscopy;  Laterality: N/A;  1200  . DILATION AND CURETTAGE OF UTERUS    . TONSILLECTOMY  1961    OB History    No data available       Home Medications    Prior to Admission medications   Medication Sig Start Date End Date Taking? Authorizing Provider  acetaminophen (TYLENOL) 500 MG tablet Take 500 mg by mouth daily as needed for moderate pain or headache.    [provider]  alprazolam Duanne Moron) 2 MG tablet Take 1 mg by mouth every 6 (six) hours.    [provider]  ARTIFICIAL TEAR OP Apply 1 drop to eye daily as needed (dry eyes).    [provider]  Bisacodyl (LAXATIVE PO) Take 1 tablet by mouth daily as needed (constipation).    [provider]  Cholecalciferol (VITAMIN D3) 2000 units TABS Take by mouth daily.     [provider]  Docusate Calcium (STOOL SOFTENER PO) Take 1 tablet by mouth daily as needed (constipation).    [provider]  metoprolol succinate (TOPROL-XL) 25 MG 24 hr tablet TAKE 1 TABLET BY MOUTH DAILY - ONLY TAKING 1/2 TABLET AT NIGHT, FILL  ONLY WHEN SHE REQUESTS SS 03/27/16 Patient taking differently: Take 12.5 mgs by mouth daily in the evening 12/24/16   Lelon Perla, MD  polyethylene glycol-electrolytes (TRILYTE) 420 g solution Take 4,000 mLs by mouth as directed. 01/06/17   Rourk, Cristopher Estimable, MD    Family History Family History  Problem Relation Age of Onset  . Diabetes Mother   . Hypertension Mother   . Heart attack Mother   . Cancer Father        Melanoma   . Diabetes Brother   . Diabetes Son     Social History Social History  Substance Use Topics  . Smoking status: Never Smoker  . Smokeless tobacco:  Never Used  . Alcohol use No     Allergies   Ciprofloxacin; Codeine; Metronidazole; Morphine and related; and Other   Review of Systems Review of Systems  Constitutional: Negative for activity change, fatigue and fever.  Respiratory: Negative for shortness of breath.   Cardiovascular: Negative for chest pain.  Gastrointestinal: Positive for abdominal pain and diarrhea. Negative for nausea and vomiting.  Skin: Positive for rash.  Neurological: Negative for weakness.     Physical Exam Updated Vital Signs BP 140/72 (BP Location: Left Arm)   Pulse 70   Temp 98.1 F (36.7 C) (Oral)   Resp 17   Ht 5\' 6"  (1.676 m)   Wt 80.7 kg (178 lb)   SpO2 100%   BMI 28.73 kg/m   Physical Exam  Constitutional: She is oriented to person, place, and time. She appears well-developed and well-nourished.  HENT:  Head: Normocephalic and atraumatic.  Eyes: Right eye exhibits no discharge.  Cardiovascular: Normal rate, regular rhythm and normal heart sounds.   No murmur heard. Pulmonary/Chest: Effort normal and breath sounds normal. She has no wheezes. She has no rales.  Abdominal: Soft. She exhibits no distension. There is tenderness.  LLQ pain  Neurological: She is oriented to person, place, and time.  Skin: Skin is warm and dry. She is not diaphoretic.  Small approx 2 cm erthematous blanching areas that are scattered.  2 on  Legs, one on back.    Psychiatric: She has a normal mood and affect.  Nursing note and vitals reviewed.    ED Treatments / Results  Labs (all labs ordered are listed, but only abnormal results are displayed) Labs Reviewed  COMPREHENSIVE METABOLIC PANEL - Abnormal; Notable for the following:       Result Value   Glucose, Bld 100 (*)    All other components within normal limits  LIPASE, BLOOD  CBC  URINALYSIS, ROUTINE W REFLEX MICROSCOPIC    EKG  EKG Interpretation None       Radiology No results found.  Procedures Procedures (including critical  care time)  Medications Ordered in ED Medications - No data to display   Initial Impression / Assessment and Plan / ED Course  I have reviewed the triage vital signs and the nursing notes.  Pertinent labs & imaging results that were available during my care of the patient were reviewed by me and considered in my medical decision making (see chart for details).     Very well-appearing 71 year old female with history of diverticulosis presenting with similar pain. Patient has tenderness left lower quadrant. She requests a little bit worse in usual sterile get CT abdomen to rule out abscess. Patient also concerned about a rash that comes and goes. We will give her follow-up with a dermatologist. I suspect this is hives.  Patietn taking PO and no fever and systemically appears well.    Final Clinical Impressions(s) / ED Diagnoses   Final diagnoses:  None    New Prescriptions New Prescriptions   No medications on file     Macarthur Critchley, MD 06/24/17 0006

## 2017-06-21 NOTE — Discharge Instructions (Signed)
Please follow upw with your primary care physician for your diverticulitis.  For your rash, you should see a dermatologist if you remain concerned  You can call [facility] in [place], [place] at [phone] for further work up.

## 2017-06-21 NOTE — ED Provider Notes (Signed)
Please see previous physicians note regarding patient's presenting history and physical, initial ED course, and associated medical decision making.   71 year old female who presents with LLQ abdominal pain, similar to previous diverticulitis history. Very tender on exam. Pending CT abdomen/pelvis to evaluate for potential complicated diverticulitis.   CT visualized and shows evidence of uncomplicated mild diverticulitis. Hemodynamically she is stable. She is able to tolerate by mouth. No leukocytosis. She is felt stable for discharge for outpatient treatment with PCP follow-up. We'll treat with course of Augmentin.     Forde Dandy, MD 06/21/17 [phone]

## 2017-07-14 ENCOUNTER — Telehealth: Payer: Self-pay | Admitting: Internal Medicine

## 2017-07-14 NOTE — Telephone Encounter (Signed)
I tried to call the pt, NA and the mailbox was full, could not leave a message. A letter was mailed to her on 02/13/17. Please make sure she got it.

## 2017-07-14 NOTE — Telephone Encounter (Signed)
Pt called to say that she had a colonoscopy back in April and wanted to talk to someone about it because she doesn't remember getting the results. Please call her at [phone]

## 2017-07-15 ENCOUNTER — Telehealth: Payer: Self-pay | Admitting: Internal Medicine

## 2017-07-15 DIAGNOSIS — D485 Neoplasm of uncertain behavior of skin: Secondary | ICD-10-CM | POA: Diagnosis not present

## 2017-07-15 DIAGNOSIS — L309 Dermatitis, unspecified: Secondary | ICD-10-CM | POA: Diagnosis not present

## 2017-07-15 DIAGNOSIS — L308 Other specified dermatitis: Secondary | ICD-10-CM | POA: Diagnosis not present

## 2017-07-15 NOTE — Telephone Encounter (Signed)
Spoke with the pt, went over her procedure note and the letter that was mailed to her. Pt said she has one loose stool a day ever since her procedure. It is not watery, no blood in her stool. She is not eating as much as she used to because she doesn't have much of an appetite. No new meds, no abx recently. Pt said she has IBS and has been under a lot of stress. She wanted me to let RMR know and see if he had any recommendations. She is aware that we will be closed tomorrow and it will be next week before I can call her back.

## 2017-07-15 NOTE — Telephone Encounter (Signed)
Communication noted;plse check with her the first of the week

## 2017-07-15 NOTE — Telephone Encounter (Signed)
Patient has called back again this morning. I told her that RMR had mailed her a letter in April about her colonoscopy results. I read the letter to her and she still seems to be confused. I told her that RMR recommendations were to repeat colonoscopy in 5 yrs due to polyps. Patient know says that she has diarrhea (gravy looking stools) one hour after she eats and she never had this before since her colonoscopy in April. I offered to make her an OV but she wants to speak with nurse because she still has multiple questions. She has another doctor's appointment today at 130. I told her that the nurses were with patients this morning and I wasn't sure what time the nurse would be available, but I would let her know that she had called again. [phone]

## 2017-07-15 NOTE — Telephone Encounter (Signed)
Tried to call pt- NA and mailbox was full.

## 2017-07-21 NOTE — Telephone Encounter (Signed)
Tried to call pt- NA 

## 2017-07-23 NOTE — Telephone Encounter (Signed)
Tried to call pt- NA 

## 2017-07-26 NOTE — Telephone Encounter (Signed)
Spoke with the pt, she said she continues to have soft "gravy-like" stools. Not watery stools. It happens once a day, about an hour after she eats. No pain, no blood in her stool. Pt is not taking any laxatives or stool softeners.   Pt thinks she has some IBS and wants to know if there is anything she needs to do or take for it?

## 2017-07-28 ENCOUNTER — Encounter: Payer: Self-pay | Admitting: Internal Medicine

## 2017-07-28 NOTE — Telephone Encounter (Signed)
Back off on laxatives; keep upcoming office visit.

## 2017-07-28 NOTE — Telephone Encounter (Signed)
Pt is not taking any laxatives.  Please schedule ov.

## 2017-07-28 NOTE — Telephone Encounter (Signed)
PATIENT SCHEDULED  °

## 2017-08-04 DIAGNOSIS — R21 Rash and other nonspecific skin eruption: Secondary | ICD-10-CM | POA: Diagnosis not present

## 2017-08-24 DIAGNOSIS — Z79899 Other long term (current) drug therapy: Secondary | ICD-10-CM | POA: Diagnosis not present

## 2017-08-24 DIAGNOSIS — Z1339 Encounter for screening examination for other mental health and behavioral disorders: Secondary | ICD-10-CM | POA: Diagnosis not present

## 2017-08-24 DIAGNOSIS — Z0000 Encounter for general adult medical examination without abnormal findings: Secondary | ICD-10-CM | POA: Diagnosis not present

## 2017-08-24 DIAGNOSIS — E7800 Pure hypercholesterolemia, unspecified: Secondary | ICD-10-CM | POA: Diagnosis not present

## 2017-08-24 DIAGNOSIS — Z6828 Body mass index (BMI) 28.0-28.9, adult: Secondary | ICD-10-CM | POA: Diagnosis not present

## 2017-08-24 DIAGNOSIS — R5383 Other fatigue: Secondary | ICD-10-CM | POA: Diagnosis not present

## 2017-08-24 DIAGNOSIS — F419 Anxiety disorder, unspecified: Secondary | ICD-10-CM | POA: Diagnosis not present

## 2017-08-24 DIAGNOSIS — Z7189 Other specified counseling: Secondary | ICD-10-CM | POA: Diagnosis not present

## 2017-08-24 DIAGNOSIS — Z1331 Encounter for screening for depression: Secondary | ICD-10-CM | POA: Diagnosis not present

## 2017-08-24 DIAGNOSIS — Z299 Encounter for prophylactic measures, unspecified: Secondary | ICD-10-CM | POA: Diagnosis not present

## 2017-08-24 DIAGNOSIS — E2839 Other primary ovarian failure: Secondary | ICD-10-CM | POA: Diagnosis not present

## 2017-08-24 DIAGNOSIS — F341 Dysthymic disorder: Secondary | ICD-10-CM | POA: Diagnosis not present

## 2017-08-24 DIAGNOSIS — Z1211 Encounter for screening for malignant neoplasm of colon: Secondary | ICD-10-CM | POA: Diagnosis not present

## 2017-09-15 ENCOUNTER — Encounter: Payer: Self-pay | Admitting: Nurse Practitioner

## 2017-09-15 ENCOUNTER — Ambulatory Visit (INDEPENDENT_AMBULATORY_CARE_PROVIDER_SITE_OTHER): Payer: Medicare Other | Admitting: Nurse Practitioner

## 2017-09-15 VITALS — BP 148/80 | HR 69 | Temp 97.0°F | Ht 66.0 in | Wt 176.0 lb

## 2017-09-15 DIAGNOSIS — K582 Mixed irritable bowel syndrome: Secondary | ICD-10-CM

## 2017-09-15 MED ORDER — DICYCLOMINE HCL 10 MG PO CAPS: 10.0000 mg | ORAL_CAPSULE | Freq: Three times a day (TID) | ORAL | 1 refills | Status: DC | PRN

## 2017-09-15 NOTE — Patient Instructions (Addendum)
1. Stop using Colace stool softeners every day. 2. If you have constipation, use Dulcolax over-the-counter suppository as per the instructions on the box. 3. If you have soft/loose stools, use Bentyl up to 3 times a day as needed. 4. I have sent in a prescription for Bentyl to your pharmacy. 5. Return for follow-up in 2 months. 6. Call us if you have any questions or concerns.

## 2017-09-15 NOTE — Progress Notes (Signed)
Primary Care Physician:  Glenda Chroman, MD Primary Gastroenterologist:  Dr. Gala Romney  Chief Complaint  Patient presents with  . Diarrhea  . heme+ stool    HPI:   Alexa Wilson is a 71 y.o. female who presents for evaluation of diarrhea.  The patient has not been seen by our practice since 04/09/2015.  At that time she was seen for constipation and abdominal pain.  Previous colonoscopy completed 01/19/2011 with repeat colonoscopy in 5 years (March 2017).  At her previous visit she was having significant constipation required magnesium citrate which did not help.  Her back pain and abdominal pain improved after her bowel movement but continued to hurt some.  Chronic constipation, unable to quantify how long.  Some hematochezia noted no other GI symptoms.  Recommended MiraLAX daily, continue Colace, follow-up as needed.    She was due for colonoscopy in 2017 but deferred due to family issues on a couple of occasions.  Colonoscopy was completed 02/11/2017 which found a reticulosis in the sigmoid colon and descending colon, 2 polyps measuring between 4-6 mm in the ascending colon, otherwise normal.  Surgical pathology found the polyps to be tubular adenoma.  Recommended repeat colonoscopy in 5 years (2023).  The patient called a couple times stating she does not remember the results of her colonoscopy, despite a letter being sent to her home.  Attempted call back found a full mailbox.  The letter was read to her over the phone and she seemed to be confused.  She complained of diarrhea after she eats since her colonoscopy.  Attempted call back by the nurse again found a full mailbox.  Upon further discussion she admitted IBS and significant recent stress.  Multiple call back attempts were without answer.  Recommended she stop taking laxatives and come in for an office visit.  She stated she was not taking any laxatives.  She informed the nurse she is not sure why she was having an office visit today.   Reviewed medications before seeing the patient and she is currently on this condyle laxative as well as Colace stool softener.  Today she states since her colonoscopy she has had "gravy-like" stools since her colonoscopy after eating. History of colonoscopy. Last episode of constipation was 2 weeks ago. Last soft stools was 2 days ago. Happens only once when it happens. Denies abdominal pain, N/V. Has occasional toilet tissue hematochezia in the setting of hemorrhoids. PCP told her her stool was heme+. When she has soft stools she does not have straining. Does have straining with constipation and uses stool softeners daily and dulcolax as needed. Denies any other upper or lower GI symptoms.  There seems to be much confusion about her symptoms. States once in a day when it happens. Then she states she has it after every meal. But then states sometimes she's constipated which lasts up to 3 days.  NOTE: Patient PMH and [place] incomplete in this note due to computer issues. See history tab for complete information. History tab information was reviewed with the patient and found to be correct.  Past Medical History:  Diagnosis Date  . Agoraphobia with panic disorder 1982  . Brown recluse spider bite   . Constipation   . Diverticulitis   . GERD (gastroesophageal reflux disease)   . Hyperlipidemia   . Lyme disease   . Migraines    "don't have them since menopause" (09/23/2016)  . Panic disorder   . Shingles     Past  Surgical History:  Procedure Laterality Date  . APPENDECTOMY  1964  . CHOLECYSTECTOMY OPEN  1966  . COLONOSCOPY  2012   RMR: 1. External hemorrhoids, otherwise normal rectum. 2. Left-sided diverticula status post snare polypectomy left colon polyp. Remainder of the colonic mucosa and terminal ileum mucoa appeared normal.   . DILATION AND CURETTAGE OF UTERUS    . TONSILLECTOMY  1961    Current Outpatient Medications  Medication Sig Dispense Refill  . acetaminophen (TYLENOL) 500 MG  tablet Take 500 mg by mouth daily as needed for moderate pain or headache.    . alprazolam (XANAX) 2 MG tablet Take 1 mg by mouth every 6 (six) hours.    . ARTIFICIAL TEAR OP Apply 1 drop to eye daily as needed (dry eyes).    . Bisacodyl (LAXATIVE PO) Take 1 tablet by mouth daily as needed (constipation).    . Cholecalciferol (VITAMIN D3) 2000 units TABS Take by mouth daily.     Marland Kitchen HYDROcodone-acetaminophen (NORCO/VICODIN) 5-325 MG tablet Take 1 tablet by mouth every 6 (six) hours as needed for moderate pain or severe pain. 5 tablet 0  . metoprolol succinate (TOPROL-XL) 25 MG 24 hr tablet TAKE 1 TABLET BY MOUTH DAILY - ONLY TAKING 1/2 TABLET AT NIGHT, FILL ONLY WHEN SHE REQUESTS SS 03/27/16 (Patient taking differently: Take 12.5 mgs by mouth daily in the evening) 90 tablet 3  . Potassium 99 MG TABS Take 1 tablet as needed by mouth.    . rosuvastatin (CRESTOR) 10 MG tablet Take 10 mg daily by mouth.    Mariane Baumgarten Calcium (STOOL SOFTENER PO) Take 1 tablet by mouth daily as needed (constipation).    . polyethylene glycol-electrolytes (TRILYTE) 420 g solution Take 4,000 mLs by mouth as directed. (Patient not taking: Reported on 09/15/2017) 4000 mL 0   No current facility-administered medications for this visit.     Allergies as of 09/15/2017 - Review Complete 09/15/2017  Allergen Reaction Noted  . Ciprofloxacin Nausea Only   . Codeine Other (See Comments)   . Metronidazole Nausea Only   . Morphine and related Nausea And Vomiting 05/11/2013  . Other Other (See Comments) 07/14/2012    Family History  Problem Relation Age of Onset  . Diabetes Mother   . Hypertension Mother   . Heart attack Mother   . Cancer Father        Melanoma   . Diabetes Brother   . Diabetes Son   . Colon cancer Neg Hx     Social History   Socioeconomic History  . Marital status: Widowed    Spouse name: Not on file  . Number of children: 5  . Years of education: Not on file  . Highest education level: Not on  file  Social Needs  . Financial resource strain: Not on file  . Food insecurity - worry: Not on file  . Food insecurity - inability: Not on file  . Transportation needs - medical: Not on file  . Transportation needs - non-medical: Not on file  Occupational History  . Not on file  Tobacco Use  . Smoking status: Never Smoker  . Smokeless tobacco: Never Used  Substance and Sexual Activity  . Alcohol use: No    Alcohol/week: 0.0 oz  . Drug use: No  . Sexual activity: No  Other Topics Concern  . Not on file  Social History Narrative  . Not on file    Review of Systems: General: Negative for anorexia, weight loss, fever,  chills, fatigue, weakness. ENT: Negative for hoarseness, difficulty swallowing. CV: Negative for chest pain, angina, palpitations, peripheral edema.  Respiratory: Negative for dyspnea at rest, cough, sputum, wheezing.  GI: See history of present illness. Endo: Negative for unusual weight change.  Heme: Negative for bruising or bleeding.    Physical Exam: BP (!) 148/80   Pulse 69   Temp (!) 97 F (36.1 C) (Oral)   Ht 5\' 6"  (1.676 m)   Wt 176 lb (79.8 kg)   BMI 28.41 kg/m  General:   Alert and oriented. Pleasant and cooperative. Well-nourished and well-developed.  Eyes:  Without icterus, sclera clear and conjunctiva pink.  Ears:  Normal auditory acuity. Cardiovascular:  S1, S2 present without murmurs appreciated. Extremities without clubbing or edema. Respiratory:  Clear to auscultation bilaterally. No wheezes, rales, or rhonchi. No distress.  Gastrointestinal:  +BS, soft, non-tender and non-distended. No HSM noted. No guarding or rebound. No masses appreciated.  Rectal:  Deferred  Musculoskalatal:  Symmetrical without gross deformities. Neurologic:  Alert and oriented x4;  grossly normal neurologically. Psych:  Alert and cooperative. Normal mood and affect. Heme/Lymph/Immune: No excessive bruising noted.    09/15/2017 9:46 AM   Disclaimer: This  note was dictated with voice recognition software. Similar sounding words can inadvertently be transcribed and may not be corrected upon review.

## 2017-09-15 NOTE — Progress Notes (Signed)
cc'ed to pcp °

## 2017-09-15 NOTE — Assessment & Plan Note (Signed)
The patient seems to have worsened IBS mixed type.  It is very difficult to get an accurate history from her.  Her son is accompanying her and they argue about her answers.  At one point she states she has constipation which last 3 days or so about every couple weeks.  Then she says she has loose/soft stools every day but only about once a day.  Then she states she has loose/soft stools every time she eats, about 3 hours postprandial.  She was unable to clarify which.  At this time it seems like she has mixed irritable bowel syndrome type.  I have we will have her stop stool softeners daily.  I will have her take Dulcolax if constipated.  I will send in Bentyl to use up to 3 times a day as needed for loose stools.  Return for follow-up in 2 months.

## 2017-09-20 DIAGNOSIS — Z1231 Encounter for screening mammogram for malignant neoplasm of breast: Secondary | ICD-10-CM | POA: Diagnosis not present

## 2017-10-05 DIAGNOSIS — F419 Anxiety disorder, unspecified: Secondary | ICD-10-CM | POA: Diagnosis not present

## 2017-10-05 DIAGNOSIS — Z299 Encounter for prophylactic measures, unspecified: Secondary | ICD-10-CM | POA: Diagnosis not present

## 2017-10-05 DIAGNOSIS — R109 Unspecified abdominal pain: Secondary | ICD-10-CM | POA: Diagnosis not present

## 2017-10-05 DIAGNOSIS — Z6828 Body mass index (BMI) 28.0-28.9, adult: Secondary | ICD-10-CM | POA: Diagnosis not present

## 2017-10-05 DIAGNOSIS — K5732 Diverticulitis of large intestine without perforation or abscess without bleeding: Secondary | ICD-10-CM | POA: Diagnosis not present

## 2017-10-05 DIAGNOSIS — M1990 Unspecified osteoarthritis, unspecified site: Secondary | ICD-10-CM | POA: Diagnosis not present

## 2017-10-07 DIAGNOSIS — K5730 Diverticulosis of large intestine without perforation or abscess without bleeding: Secondary | ICD-10-CM | POA: Diagnosis not present

## 2017-10-07 DIAGNOSIS — R933 Abnormal findings on diagnostic imaging of other parts of digestive tract: Secondary | ICD-10-CM | POA: Diagnosis not present

## 2017-10-07 DIAGNOSIS — R1032 Left lower quadrant pain: Secondary | ICD-10-CM | POA: Diagnosis not present

## 2017-10-18 DIAGNOSIS — R109 Unspecified abdominal pain: Secondary | ICD-10-CM | POA: Diagnosis not present

## 2017-10-18 DIAGNOSIS — F410 Panic disorder [episodic paroxysmal anxiety] without agoraphobia: Secondary | ICD-10-CM | POA: Diagnosis not present

## 2017-10-18 DIAGNOSIS — E7800 Pure hypercholesterolemia, unspecified: Secondary | ICD-10-CM | POA: Diagnosis not present

## 2017-10-18 DIAGNOSIS — Z299 Encounter for prophylactic measures, unspecified: Secondary | ICD-10-CM | POA: Diagnosis not present

## 2017-10-18 DIAGNOSIS — F341 Dysthymic disorder: Secondary | ICD-10-CM | POA: Diagnosis not present

## 2017-10-18 DIAGNOSIS — Z6827 Body mass index (BMI) 27.0-27.9, adult: Secondary | ICD-10-CM | POA: Diagnosis not present

## 2017-10-18 DIAGNOSIS — Z2821 Immunization not carried out because of patient refusal: Secondary | ICD-10-CM | POA: Diagnosis not present

## 2017-10-19 ENCOUNTER — Other Ambulatory Visit: Payer: Self-pay

## 2017-10-19 ENCOUNTER — Encounter (HOSPITAL_COMMUNITY): Payer: Self-pay | Admitting: Emergency Medicine

## 2017-10-19 DIAGNOSIS — K5732 Diverticulitis of large intestine without perforation or abscess without bleeding: Secondary | ICD-10-CM | POA: Diagnosis not present

## 2017-10-19 DIAGNOSIS — Z6827 Body mass index (BMI) 27.0-27.9, adult: Secondary | ICD-10-CM | POA: Diagnosis not present

## 2017-10-19 DIAGNOSIS — Z79899 Other long term (current) drug therapy: Secondary | ICD-10-CM | POA: Insufficient documentation

## 2017-10-19 DIAGNOSIS — R102 Pelvic and perineal pain: Secondary | ICD-10-CM | POA: Diagnosis not present

## 2017-10-19 DIAGNOSIS — R1084 Generalized abdominal pain: Secondary | ICD-10-CM | POA: Diagnosis present

## 2017-10-19 DIAGNOSIS — Z713 Dietary counseling and surveillance: Secondary | ICD-10-CM | POA: Diagnosis not present

## 2017-10-19 DIAGNOSIS — R109 Unspecified abdominal pain: Secondary | ICD-10-CM | POA: Diagnosis not present

## 2017-10-19 DIAGNOSIS — Z299 Encounter for prophylactic measures, unspecified: Secondary | ICD-10-CM | POA: Diagnosis not present

## 2017-10-19 LAB — CBC
HCT: 39.7 % (ref 36.0–46.0)
Hemoglobin: 12.9 g/dL (ref 12.0–15.0)
MCH: 31.6 pg (ref 26.0–34.0)
MCHC: 32.5 g/dL (ref 30.0–36.0)
MCV: 97.3 fL (ref 78.0–100.0)
PLATELETS: 349 10*3/uL (ref 150–400)
RBC: 4.08 MIL/uL (ref 3.87–5.11)
RDW: 12.7 % (ref 11.5–15.5)
WBC: 11.8 10*3/uL — AB (ref 4.0–10.5)

## 2017-10-19 LAB — COMPREHENSIVE METABOLIC PANEL
ALK PHOS: 93 U/L (ref 38–126)
ALT: 20 U/L (ref 14–54)
AST: 30 U/L (ref 15–41)
Albumin: 4.1 g/dL (ref 3.5–5.0)
Anion gap: 9 (ref 5–15)
BUN: 11 mg/dL (ref 6–20)
CALCIUM: 9.4 mg/dL (ref 8.9–10.3)
CO2: 25 mmol/L (ref 22–32)
CREATININE: 0.88 mg/dL (ref 0.44–1.00)
Chloride: 103 mmol/L (ref 101–111)
Glucose, Bld: 94 mg/dL (ref 65–99)
Potassium: 3.6 mmol/L (ref 3.5–5.1)
Sodium: 137 mmol/L (ref 135–145)
TOTAL PROTEIN: 8 g/dL (ref 6.5–8.1)
Total Bilirubin: 1 mg/dL (ref 0.3–1.2)

## 2017-10-19 LAB — URINALYSIS, ROUTINE W REFLEX MICROSCOPIC
Bilirubin Urine: NEGATIVE
Glucose, UA: NEGATIVE mg/dL
HGB URINE DIPSTICK: NEGATIVE
Ketones, ur: NEGATIVE mg/dL
LEUKOCYTES UA: NEGATIVE
NITRITE: NEGATIVE
PROTEIN: NEGATIVE mg/dL
Specific Gravity, Urine: 1.012 (ref 1.005–1.030)
pH: 5 (ref 5.0–8.0)

## 2017-10-19 LAB — LIPASE, BLOOD: Lipase: 37 U/L (ref 11–51)

## 2017-10-19 NOTE — ED Triage Notes (Signed)
Pt states she had a normal BM this am

## 2017-10-19 NOTE — ED Triage Notes (Signed)
Pt states was seen at PCP yesterday- was told to come here or [facility] if no better,  Pt states that her colon is pushing out on her vagina -- "I can feel it, but it isn't coming out. Only hurts when I am constipated or impacted"  Also c/o low abd pain into vaginal area/groin area.

## 2017-10-20 ENCOUNTER — Emergency Department (HOSPITAL_COMMUNITY): Payer: Medicare Other

## 2017-10-20 ENCOUNTER — Emergency Department (HOSPITAL_COMMUNITY)
Admission: EM | Admit: 2017-10-20 | Discharge: 2017-10-20 | Disposition: A | Discharge status: Home / Self Care | Payer: Medicare Other | Attending: Emergency Medicine | Admitting: Emergency Medicine

## 2017-10-20 DIAGNOSIS — K5732 Diverticulitis of large intestine without perforation or abscess without bleeding: Secondary | ICD-10-CM | POA: Diagnosis not present

## 2017-10-20 MED ORDER — SODIUM CHLORIDE 0.9 % IV BOLUS (SEPSIS)
1000.0000 mL | Freq: Once | INTRAVENOUS | Status: AC
  Administered 2017-10-20: 1000 mL via INTRAVENOUS

## 2017-10-20 MED ORDER — MOXIFLOXACIN HCL 400 MG PO TABS: 400.0000 mg | ORAL_TABLET | Freq: Every day | ORAL | 0 refills | Status: DC

## 2017-10-20 MED ORDER — FENTANYL CITRATE (PF) 100 MCG/2ML IJ SOLN
50.0000 ug | Freq: Once | INTRAMUSCULAR | Status: AC
  Administered 2017-10-20: 50 ug via INTRAVENOUS
  Filled 2017-10-20: qty 2

## 2017-10-20 NOTE — ED Notes (Signed)
Pt departed in NAD.  

## 2017-10-20 NOTE — ED Notes (Signed)
ED Provider at bedside. 

## 2017-10-20 NOTE — ED Notes (Signed)
Pt called several times without response

## 2017-10-20 NOTE — ED Notes (Signed)
ED Provider at bedside. Bed setup for pelvic exam with Lovena Le, RN observing.

## 2017-10-20 NOTE — ED Provider Notes (Signed)
[facility] EMERGENCY DEPARTMENT Provider Note   CSN: [id number] Arrival date & time: 10/19/17  1827     History   Chief Complaint Chief Complaint  Patient presents with  . Abdominal Pain  . Vaginal Pain    HPI Alexa Wilson is a 71 y.o. female.  The history is provided by the patient.  Abdominal Pain   This is a recurrent problem. The current episode started more than 2 days ago. The problem occurs daily. The problem has been gradually worsening. The pain is located in the generalized abdominal region. The pain is moderate. Pertinent negatives include fever and dysuria. The symptoms are aggravated by palpation. Nothing relieves the symptoms.  Vaginal Pain  Associated symptoms include abdominal pain.  Patient with history of panic disorder, constipation, history of diverticulitis presents with abdominal pain  She reports the first time she can recall this pain was in the year 2000 Since that time she has had intermittent episodes This episode started several days ago and is worsening No fevers or vomiting, no diarrhea is reported No significant dysuria is reported  Also reports history of "colon Going pushing out my vagina " but nobody believes her that this is occurring Past Medical History:  Diagnosis Date  . Agoraphobia with panic disorder 1982  . Brown recluse spider bite   . Constipation   . Diverticulitis   . GERD (gastroesophageal reflux disease)   . Hyperlipidemia   . Lyme disease   . Migraines    "don't have them since menopause" (09/23/2016)  . Panic disorder   . Shingles     Patient Active Problem List   Diagnosis Date Noted  . Diverticulitis 09/22/2016  . Acute hypokalemia 09/22/2016  . Hyperlipidemia 11/15/2015  . Constipation 04/09/2015  . Palpitations 08/11/2013  . Chest pressure 07/15/2013  . Right arm weakness 07/15/2013  . Sigmoid diverticulitis 07/14/2012  . DYSPEPSIA 09/06/2009  . IBS 09/06/2009  . COLONIC POLYPS,  HX OF 09/06/2009  . WEIGHT LOSS 06/07/2009  . NAUSEA AND VOMITING 06/07/2009  . ABDOMINAL PAIN 06/07/2009  . EPIGASTRIC PAIN 06/07/2009  . COLONIC POLYPS 02/21/2009  . DYSPHAGIA 02/21/2009  . PANIC DISORDER 02/20/2009  . CATARACTS 02/20/2009  . DIVERTICULOSIS OF COLON 02/20/2009  . HELICOBACTER PYLORI GASTRITIS, HX OF 02/20/2009    Past Surgical History:  Procedure Laterality Date  . APPENDECTOMY  1964  . CHOLECYSTECTOMY OPEN  1966  . COLONOSCOPY  2012   RMR: 1. External hemorrhoids, otherwise normal rectum. 2. Left-sided diverticula status post snare polypectomy left colon polyp. Remainder of the colonic mucosa and terminal ileum mucoa appeared normal.   . COLONOSCOPY N/A 02/11/2017   Procedure: COLONOSCOPY;  Surgeon: Daneil Dolin, MD;  Location: [facility];  Service: Endoscopy;  Laterality: N/A;  1200  . DILATION AND CURETTAGE OF UTERUS    . TONSILLECTOMY  1961    OB History    No data available       Home Medications    Prior to Admission medications   Medication Sig Start Date End Date Taking? Authorizing Provider  acetaminophen (TYLENOL) 500 MG tablet Take 500 mg by mouth daily as needed for moderate pain or headache.    [provider]  alprazolam Duanne Moron) 2 MG tablet Take 1 mg by mouth every 6 (six) hours.    [provider]  ARTIFICIAL TEAR OP Apply 1 drop to eye daily as needed (dry eyes).    [provider]  Bisacodyl (LAXATIVE PO) Take  1 tablet by mouth daily as needed (constipation).    [provider]  Cholecalciferol (VITAMIN D3) 2000 units TABS Take by mouth daily.     [provider]  dicyclomine (BENTYL) 10 MG capsule Take 1 capsule (10 mg total) 3 (three) times daily as needed by mouth (for soft/loose stools). 09/15/17   Carlis Stable, NP  Docusate Calcium (STOOL SOFTENER PO) Take 1 tablet by mouth daily as needed (constipation).    [provider]  HYDROcodone-acetaminophen (NORCO/VICODIN) 5-325 MG  tablet Take 1 tablet by mouth every 6 (six) hours as needed for moderate pain or severe pain. 06/21/17   Forde Dandy, MD  metoprolol succinate (TOPROL-XL) 25 MG 24 hr tablet TAKE 1 TABLET BY MOUTH DAILY - ONLY TAKING 1/2 TABLET AT NIGHT, FILL ONLY WHEN SHE REQUESTS SS 03/27/16 Patient taking differently: Take 12.5 mgs by mouth daily in the evening 12/24/16   Lelon Perla, MD  polyethylene glycol-electrolytes (TRILYTE) 420 g solution Take 4,000 mLs by mouth as directed. Patient not taking: Reported on 09/15/2017 01/06/17   Daneil Dolin, MD  Potassium 99 MG TABS Take 1 tablet as needed by mouth.    [provider]  rosuvastatin (CRESTOR) 10 MG tablet Take 10 mg daily by mouth.    [provider]    Family History Family History  Problem Relation Age of Onset  . Diabetes Mother   . Hypertension Mother   . Heart attack Mother   . Cancer Father        Melanoma   . Diabetes Brother   . Diabetes Son   . Colon cancer Neg Hx     Social History Social History   Tobacco Use  . Smoking status: Never Smoker  . Smokeless tobacco: Never Used  Substance Use Topics  . Alcohol use: No    Alcohol/week: 0.0 oz  . Drug use: No     Allergies   Ciprofloxacin; Codeine; Metronidazole; Morphine and related; and Other   Review of Systems Review of Systems  Constitutional: Negative for fever.  Gastrointestinal: Positive for abdominal pain.  Genitourinary: Positive for vaginal pain. Negative for dysuria.  All other systems reviewed and are negative.    Physical Exam Updated Vital Signs BP 133/76 (BP Location: Left Arm)   Pulse 81   Temp 98.3 F (36.8 C) (Oral)   Resp 16   Ht 1.676 m (5\' 6" )   Wt 79.8 kg (176 lb)   SpO2 99%   BMI 28.41 kg/m   Physical Exam CONSTITUTIONAL: Elderly, anxious HEAD: Normocephalic/atraumatic EYES: EOMI/PERRL no icterus ENMT: Mucous membranes moist NECK: supple no meningeal signs SPINE/BACK:entire spine nontender CV: S1/S2  noted, no murmurs/rubs/gallops noted LUNGS: Lungs are clear to auscultation bilaterally, no apparent distress ABDOMEN: soft, mild lower abdominal tenderness noted, no rebound or guarding, bowel sounds noted throughout abdomen GU:no cva tenderness, pelvic exam chaperoned entire time by female nurse Lovena Le.  No vaginal bleeding/discharge, no CMT.  No visible trauma.  ?small rectocele that is nontender No erythema or lesions noted NEURO: Pt is awake/alert/appropriate, moves all extremitiesx4.  No facial droop.   EXTREMITIES: pulses normal/equal, full ROM SKIN: warm, color normal PSYCH: Anxious  ED Treatments / Results  Labs (all labs ordered are listed, but only abnormal results are displayed) Labs Reviewed  CBC - Abnormal; Notable for the following components:      Result Value   WBC 11.8 (*)    All other components within normal limits  LIPASE, BLOOD  COMPREHENSIVE METABOLIC PANEL  URINALYSIS, ROUTINE W REFLEX MICROSCOPIC    EKG  EKG Interpretation None       Radiology Ct Abdomen Pelvis Wo Contrast  Result Date: 10/20/2017 CLINICAL DATA:  Lower abdominal pain. EXAM: CT ABDOMEN AND PELVIS WITHOUT CONTRAST TECHNIQUE: Multidetector CT imaging of the abdomen and pelvis was performed following the standard protocol without IV contrast. COMPARISON:  CT 2 weeks prior 10/07/2017, additional prior exams FINDINGS: Lower chest: Left lobe scarring.  No consolidation or pleural fluid. Hepatobiliary: No focal hepatic lesion. Gallbladder surgically absent. No biliary dilatation. Pancreas: No ductal dilatation or inflammation. Spleen: Normal in size without focal abnormality. Adrenals/Urinary Tract: No adrenal nodule. No renal stone or hydronephrosis. No perinephric edema. The ureters are decompressed without stone along the course. Urinary bladder physiologically distended without stone. Stomach/Bowel: Acute diverticulitis of the mid sigmoid colon with inflamed diverticulum and adjacent pericolonic  edema. No perforation or abscess. Multiple additional noninflamed diverticula throughout the colon. Moderate stool in the more proximal colon. The appendix is surgically absent. No small bowel inflammation or obstruction. Stomach is distended with ingested contents. Tiny hiatal hernia. Vascular/Lymphatic: Aortic atherosclerosis without aneurysm. No enlarged abdominal or pelvic lymph nodes. Reproductive: Uterus and bilateral adnexa are unremarkable. Other: Trace reactive free fluid in the pelvis. No free air or intra-abdominal abscess. Musculoskeletal: Stable scoliosis and degenerative change in the spine. There are no acute or suspicious osseous abnormalities. IMPRESSION: 1. Acute uncomplicated diverticulitis of the mid sigmoid colon. 2.  Aortic Atherosclerosis (ICD10-I70.0). Electronically Signed   By: Jeb Levering M.D.   On: 10/20/2017 04:33    Procedures Procedures (including critical care time)  Medications Ordered in ED Medications  sodium chloride 0.9 % bolus 1,000 mL (1,000 mLs Intravenous New Bag/Given 10/20/17 0458)  fentaNYL (SUBLIMAZE) injection 50 mcg (50 mcg Intravenous Given 10/20/17 0459)     Initial Impression / Assessment and Plan / ED Course  I have reviewed the triage vital signs and the nursing notes.  Pertinent labs & imaging results that were available during my care of the patient were reviewed by me and considered in my medical decision making (see chart for details).     3:34 AM Patient in the ED for recurrent abdominal pain In terms of her rectocele this appears to be a nonissue and is nontender In terms of her abdominal pain I offered a CT scan However she reports she had one earlier this month at [facility] Reviewed those results on PACS and she had diverticulosis without diverticulitis She reports she needs her anxiety medicine before she proceeds with further testing 3:59 AM Patient now agrees to CT scan, but she refuses any contrast She  does still have significant lower abdominal tenderness, and she now tells me that she actually took a course of Augmentin already after previous CT scan Will obtain CT scan at this time 5:31 AM CT imaging reveals diverticulitis without perforation This is a recurrent issue, she has had multiple CT scans before that revealed diverticulitis Just finished Augmentin earlier this month for diverticulitis We discussed previous antibiotics that she has used, she has used Levaquin without issue before, but she refuses to use Cipro and Flagyl due to nausea When I reviewed the antibiotics that can be used for diverticulitis, Avelox can be used as monotherapy She thinks she can probably tolerate this medicine Will prescribe Avelox for the next week  Otherwise she is awake and alert, appears improved, she is not septic appearing I feel she is appropriate for  outpatient management at this time  Her son is now in the ER, he is very well informed about this condition. Given the frequency of these episodes of diverticulitis, we agreed that surgical referral is in order He requests referral to Dr. Annie Main gross with [facility] [place] [facility] Final Clinical Impressions(s) / ED Diagnoses   Final diagnoses:  Sigmoid diverticulitis    ED Discharge Orders        Ordered    moxifloxacin (AVELOX) 400 MG tablet  Daily     10/20/17 0529       Ripley Fraise, MD 10/20/17 [phone]

## 2017-11-16 ENCOUNTER — Ambulatory Visit: Payer: Medicare Other | Admitting: Internal Medicine

## 2018-01-17 DIAGNOSIS — R109 Unspecified abdominal pain: Secondary | ICD-10-CM | POA: Diagnosis not present

## 2018-01-17 DIAGNOSIS — M1990 Unspecified osteoarthritis, unspecified site: Secondary | ICD-10-CM | POA: Diagnosis not present

## 2018-01-17 DIAGNOSIS — K5900 Constipation, unspecified: Secondary | ICD-10-CM | POA: Diagnosis not present

## 2018-01-17 DIAGNOSIS — M19011 Primary osteoarthritis, right shoulder: Secondary | ICD-10-CM | POA: Diagnosis not present

## 2018-01-17 DIAGNOSIS — Z6828 Body mass index (BMI) 28.0-28.9, adult: Secondary | ICD-10-CM | POA: Diagnosis not present

## 2018-01-17 DIAGNOSIS — Z299 Encounter for prophylactic measures, unspecified: Secondary | ICD-10-CM | POA: Diagnosis not present

## 2018-01-17 DIAGNOSIS — M25511 Pain in right shoulder: Secondary | ICD-10-CM | POA: Diagnosis not present

## 2018-01-17 DIAGNOSIS — R1032 Left lower quadrant pain: Secondary | ICD-10-CM | POA: Diagnosis not present

## 2018-02-15 ENCOUNTER — Other Ambulatory Visit: Payer: Self-pay | Admitting: Cardiology

## 2018-02-15 DIAGNOSIS — E785 Hyperlipidemia, unspecified: Secondary | ICD-10-CM

## 2018-02-15 DIAGNOSIS — R002 Palpitations: Secondary | ICD-10-CM

## 2018-02-24 ENCOUNTER — Telehealth: Payer: Self-pay | Admitting: Cardiology

## 2018-02-24 DIAGNOSIS — E785 Hyperlipidemia, unspecified: Secondary | ICD-10-CM

## 2018-02-24 DIAGNOSIS — R002 Palpitations: Secondary | ICD-10-CM

## 2018-02-24 MED ORDER — METOPROLOL SUCCINATE ER 25 MG PO TB24: 12.5000 mg | ORAL_TABLET | Freq: Every day | ORAL | 0 refills | Status: DC

## 2018-02-24 NOTE — Telephone Encounter (Signed)
New message    *STAT* If patient is at the pharmacy, call can be transferred to refill team.   1. Which medications need to be refilled? (please list name of each medication and dose if known) metoprolol succinate (TOPROL-XL) 25 MG 24 hr tablet  2. Which pharmacy/location (including street and city if local pharmacy) is medication to be sent to?[facility] - [address], [place], [place]  3. Do they need a 30 day or 90 day supply? [place]

## 2018-03-31 DIAGNOSIS — J029 Acute pharyngitis, unspecified: Secondary | ICD-10-CM | POA: Diagnosis not present

## 2018-03-31 DIAGNOSIS — J329 Chronic sinusitis, unspecified: Secondary | ICD-10-CM | POA: Diagnosis not present

## 2018-03-31 DIAGNOSIS — W57XXXA Bitten or stung by nonvenomous insect and other nonvenomous arthropods, initial encounter: Secondary | ICD-10-CM | POA: Diagnosis not present

## 2018-03-31 DIAGNOSIS — Z299 Encounter for prophylactic measures, unspecified: Secondary | ICD-10-CM | POA: Diagnosis not present

## 2018-03-31 DIAGNOSIS — S40869A Insect bite (nonvenomous) of unspecified upper arm, initial encounter: Secondary | ICD-10-CM | POA: Diagnosis not present

## 2018-03-31 DIAGNOSIS — Z6828 Body mass index (BMI) 28.0-28.9, adult: Secondary | ICD-10-CM | POA: Diagnosis not present

## 2018-04-26 DIAGNOSIS — Z299 Encounter for prophylactic measures, unspecified: Secondary | ICD-10-CM | POA: Diagnosis not present

## 2018-04-26 DIAGNOSIS — F419 Anxiety disorder, unspecified: Secondary | ICD-10-CM | POA: Diagnosis not present

## 2018-04-26 DIAGNOSIS — F329 Major depressive disorder, single episode, unspecified: Secondary | ICD-10-CM | POA: Diagnosis not present

## 2018-04-26 DIAGNOSIS — Z6828 Body mass index (BMI) 28.0-28.9, adult: Secondary | ICD-10-CM | POA: Diagnosis not present

## 2018-04-26 DIAGNOSIS — L03113 Cellulitis of right upper limb: Secondary | ICD-10-CM | POA: Diagnosis not present

## 2018-05-31 DIAGNOSIS — I7300 Raynaud's syndrome without gangrene: Secondary | ICD-10-CM | POA: Diagnosis not present

## 2018-05-31 DIAGNOSIS — F410 Panic disorder [episodic paroxysmal anxiety] without agoraphobia: Secondary | ICD-10-CM | POA: Diagnosis not present

## 2018-05-31 DIAGNOSIS — Z299 Encounter for prophylactic measures, unspecified: Secondary | ICD-10-CM | POA: Diagnosis not present

## 2018-05-31 DIAGNOSIS — H9319 Tinnitus, unspecified ear: Secondary | ICD-10-CM | POA: Diagnosis not present

## 2018-05-31 DIAGNOSIS — Z6828 Body mass index (BMI) 28.0-28.9, adult: Secondary | ICD-10-CM | POA: Diagnosis not present

## 2018-06-01 NOTE — Progress Notes (Deleted)
HPI: FU palpitations and chest pain. Echocardiogram in September of 2014 showed normal LV function. There was grade 1 diastolic dysfunction. Trace mitral regurgitation. Patient has had intermittent palpitations for years. Exercise treadmill in November of 2014 showed poor exercise tolerance. There was no chest pain, no ECG changes and no arrhythmias. Monitor in November of 2014 showed sinus rhythm with PACs and PVCs. Since I last saw her,   Current Outpatient Medications  Medication Sig Dispense Refill  . acetaminophen (TYLENOL) 500 MG tablet Take 500 mg by mouth daily as needed for moderate pain or headache.    . alprazolam (XANAX) 2 MG tablet Take 1 mg by mouth every 6 (six) hours.    . ARTIFICIAL TEAR OP Apply 1 drop to eye daily as needed (dry eyes).    . Bisacodyl (LAXATIVE PO) Take 1 tablet by mouth daily as needed (constipation).    . Cholecalciferol (VITAMIN D3) 2000 units TABS Take by mouth daily.     Marland Kitchen dicyclomine (BENTYL) 10 MG capsule Take 1 capsule (10 mg total) 3 (three) times daily as needed by mouth (for soft/loose stools). 90 capsule 1  . Docusate Calcium (STOOL SOFTENER PO) Take 1 tablet by mouth daily as needed (constipation).    . metoprolol succinate (TOPROL-XL) 25 MG 24 hr tablet Take 0.5 tablets (12.5 mg total) by mouth daily. NEED OV. 15 tablet 0  . moxifloxacin (AVELOX) 400 MG tablet Take 1 tablet (400 mg total) by mouth daily at 8 pm. 7 tablet 0  . polyethylene glycol-electrolytes (TRILYTE) 420 g solution Take 4,000 mLs by mouth as directed. 4000 mL 0  . rosuvastatin (CRESTOR) 10 MG tablet Take 10 mg daily by mouth.     No current facility-administered medications for this visit.      Past Medical History:  Diagnosis Date  . Agoraphobia with panic disorder 1982  . Brown recluse spider bite   . Constipation   . Diverticulitis   . GERD (gastroesophageal reflux disease)   . Hyperlipidemia   . Lyme disease   . Migraines    "don't have them since  menopause" (09/23/2016)  . Panic disorder   . Shingles     Past Surgical History:  Procedure Laterality Date  . APPENDECTOMY  1964  . CHOLECYSTECTOMY OPEN  1966  . COLONOSCOPY  2012   RMR: 1. External hemorrhoids, otherwise normal rectum. 2. Left-sided diverticula status post snare polypectomy left colon polyp. Remainder of the colonic mucosa and terminal ileum mucoa appeared normal.   . COLONOSCOPY N/A 02/11/2017   Procedure: COLONOSCOPY;  Surgeon: Daneil Dolin, MD;  Location: [facility];  Service: Endoscopy;  Laterality: N/A;  1200  . DILATION AND CURETTAGE OF UTERUS    . TONSILLECTOMY  1961    Social History   Socioeconomic History  . Marital status: Widowed    Spouse name: Not on file  . Number of children: 5  . Years of education: Not on file  . Highest education level: Not on file  Occupational History  . Not on file  Social Needs  . Financial resource strain: Not on file  . Food insecurity:    Worry: Not on file    Inability: Not on file  . Transportation needs:    Medical: Not on file    Non-medical: Not on file  Tobacco Use  . Smoking status: Never Smoker  . Smokeless tobacco: Never Used  Substance and Sexual Activity  . Alcohol use: No  Alcohol/week: 0.0 oz  . Drug use: No  . Sexual activity: Never  Lifestyle  . Physical activity:    Days per week: Not on file    Minutes per session: Not on file  . Stress: Not on file  Relationships  . Social connections:    Talks on phone: Not on file    Gets together: Not on file    Attends religious service: Not on file    Active member of club or organization: Not on file    Attends meetings of clubs or organizations: Not on file    Relationship status: Not on file  . Intimate partner violence:    Fear of current or ex partner: Not on file    Emotionally abused: Not on file    Physically abused: Not on file    Forced sexual activity: Not on file  Other Topics Concern  . Not on file  Social History  Narrative  . Not on file    Family History  Problem Relation Age of Onset  . Diabetes Mother   . Hypertension Mother   . Heart attack Mother   . Cancer Father        Melanoma   . Diabetes Brother   . Diabetes Son   . Colon cancer Neg Hx     ROS: no fevers or chills, productive cough, hemoptysis, dysphasia, odynophagia, melena, hematochezia, dysuria, hematuria, rash, seizure activity, orthopnea, PND, pedal edema, claudication. Remaining systems are negative.  Physical Exam: Well-developed well-nourished in no acute distress.  Skin is warm and dry.  HEENT is normal.  Neck is supple.  Chest is clear to auscultation with normal expansion.  Cardiovascular exam is regular rate and rhythm.  Abdominal exam nontender or distended. No masses palpated. Extremities show no edema. neuro grossly intact  ECG- personally reviewed  A/P  1  Kirk Ruths, MD

## 2018-06-06 ENCOUNTER — Encounter (HOSPITAL_COMMUNITY): Payer: Self-pay | Admitting: Emergency Medicine

## 2018-06-06 ENCOUNTER — Other Ambulatory Visit: Payer: Self-pay

## 2018-06-06 ENCOUNTER — Observation Stay (HOSPITAL_COMMUNITY)
Admission: EM | Admit: 2018-06-06 | Discharge: 2018-06-07 | Disposition: A | Discharge status: Home / Self Care | Payer: Medicare Other | Attending: Internal Medicine | Admitting: Internal Medicine

## 2018-06-06 ENCOUNTER — Emergency Department (HOSPITAL_COMMUNITY): Payer: Medicare Other

## 2018-06-06 DIAGNOSIS — K5790 Diverticulosis of intestine, part unspecified, without perforation or abscess without bleeding: Secondary | ICD-10-CM | POA: Diagnosis not present

## 2018-06-06 DIAGNOSIS — K5732 Diverticulitis of large intestine without perforation or abscess without bleeding: Secondary | ICD-10-CM | POA: Diagnosis not present

## 2018-06-06 DIAGNOSIS — Z79899 Other long term (current) drug therapy: Secondary | ICD-10-CM | POA: Insufficient documentation

## 2018-06-06 DIAGNOSIS — K5900 Constipation, unspecified: Secondary | ICD-10-CM

## 2018-06-06 DIAGNOSIS — T887XXA Unspecified adverse effect of drug or medicament, initial encounter: Secondary | ICD-10-CM | POA: Diagnosis not present

## 2018-06-06 DIAGNOSIS — R109 Unspecified abdominal pain: Secondary | ICD-10-CM

## 2018-06-06 DIAGNOSIS — T508X5A Adverse effect of diagnostic agents, initial encounter: Secondary | ICD-10-CM

## 2018-06-06 DIAGNOSIS — F418 Other specified anxiety disorders: Secondary | ICD-10-CM | POA: Insufficient documentation

## 2018-06-06 DIAGNOSIS — R1030 Lower abdominal pain, unspecified: Secondary | ICD-10-CM | POA: Diagnosis present

## 2018-06-06 DIAGNOSIS — E785 Hyperlipidemia, unspecified: Secondary | ICD-10-CM | POA: Diagnosis not present

## 2018-06-06 DIAGNOSIS — K5792 Diverticulitis of intestine, part unspecified, without perforation or abscess without bleeding: Secondary | ICD-10-CM

## 2018-06-06 LAB — URINALYSIS, ROUTINE W REFLEX MICROSCOPIC
Bilirubin Urine: NEGATIVE
Glucose, UA: NEGATIVE mg/dL
Hgb urine dipstick: NEGATIVE
KETONES UR: NEGATIVE mg/dL
Leukocytes, UA: NEGATIVE
NITRITE: NEGATIVE
PH: 5 (ref 5.0–8.0)
Protein, ur: NEGATIVE mg/dL
Specific Gravity, Urine: 1.011 (ref 1.005–1.030)

## 2018-06-06 LAB — COMPREHENSIVE METABOLIC PANEL
ALK PHOS: 87 U/L (ref 38–126)
ALT: 18 U/L (ref 0–44)
ANION GAP: 8 (ref 5–15)
AST: 29 U/L (ref 15–41)
Albumin: 4.2 g/dL (ref 3.5–5.0)
BILIRUBIN TOTAL: 0.7 mg/dL (ref 0.3–1.2)
BUN: 13 mg/dL (ref 8–23)
CALCIUM: 9 mg/dL (ref 8.9–10.3)
CO2: 27 mmol/L (ref 22–32)
Chloride: 106 mmol/L (ref 98–111)
Creatinine, Ser: 0.93 mg/dL (ref 0.44–1.00)
GFR calc Af Amer: >60 mL/min (ref >60)
GFR calc non Af Amer: >60 mL/min (ref >60)
GLUCOSE: 95 mg/dL (ref 70–99)
Potassium: 3.6 mmol/L (ref 3.5–5.1)
Sodium: 141 mmol/L (ref 135–145)
TOTAL PROTEIN: 7.6 g/dL (ref 6.5–8.1)

## 2018-06-06 LAB — CBC
HCT: 39.8 % (ref 36.0–46.0)
Hemoglobin: 13 g/dL (ref 12.0–15.0)
MCH: 32.2 pg (ref 26.0–34.0)
MCHC: 32.7 g/dL (ref 30.0–36.0)
MCV: 98.5 fL (ref 78.0–100.0)
Platelets: 289 10*3/uL (ref 150–400)
RBC: 4.04 MIL/uL (ref 3.87–5.11)
RDW: 13 % (ref 11.5–15.5)
WBC: 7.3 10*3/uL (ref 4.0–10.5)

## 2018-06-06 LAB — LIPASE, BLOOD: Lipase: 43 U/L (ref 11–51)

## 2018-06-06 MED ORDER — ACETAMINOPHEN 650 MG RE SUPP: 650.0000 mg | Freq: Four times a day (QID) | RECTAL | Status: DC | PRN

## 2018-06-06 MED ORDER — ONDANSETRON HCL 4 MG/2ML IJ SOLN: 4.0000 mg | Freq: Four times a day (QID) | INTRAMUSCULAR | Status: DC | PRN

## 2018-06-06 MED ORDER — METOPROLOL SUCCINATE ER 25 MG PO TB24
12.5000 mg | ORAL_TABLET | Freq: Every day | ORAL | Status: DC
  Administered 2018-06-06: 12.5 mg via ORAL
  Filled 2018-06-06 (×2): qty 1

## 2018-06-06 MED ORDER — ENOXAPARIN SODIUM 40 MG/0.4ML ~~LOC~~ SOLN
40.0000 mg | SUBCUTANEOUS | Status: DC
  Administered 2018-06-06: 40 mg via SUBCUTANEOUS
  Filled 2018-06-06: qty 0.4

## 2018-06-06 MED ORDER — ACETAMINOPHEN 325 MG PO TABS
650.0000 mg | ORAL_TABLET | Freq: Four times a day (QID) | ORAL | Status: DC | PRN
  Administered 2018-06-07: 650 mg via ORAL
  Filled 2018-06-06: qty 2

## 2018-06-06 MED ORDER — OXYCODONE HCL 5 MG PO TABS
5.0000 mg | ORAL_TABLET | ORAL | Status: DC | PRN
  Administered 2018-06-07: 5 mg via ORAL
  Filled 2018-06-06: qty 1

## 2018-06-06 MED ORDER — ALPRAZOLAM 1 MG PO TABS
1.0000 mg | ORAL_TABLET | Freq: Four times a day (QID) | ORAL | Status: DC | PRN
  Administered 2018-06-07: 2 mg via ORAL
  Filled 2018-06-06: qty 2

## 2018-06-06 MED ORDER — ONDANSETRON HCL 4 MG PO TABS: 4.0000 mg | ORAL_TABLET | Freq: Four times a day (QID) | ORAL | Status: DC | PRN

## 2018-06-06 MED ORDER — IOPAMIDOL (ISOVUE-300) INJECTION 61%
100.0000 mL | Freq: Once | INTRAVENOUS | Status: AC | PRN
  Administered 2018-06-06: 100 mL via INTRAVENOUS

## 2018-06-06 MED ORDER — IOPAMIDOL (ISOVUE-300) INJECTION 61%
INTRAVENOUS | Status: AC
  Administered 2018-06-06: 12:00:00
  Filled 2018-06-06: qty 100

## 2018-06-06 MED ORDER — LACTATED RINGERS IV SOLN
INTRAVENOUS | Status: DC
  Administered 2018-06-06 – 2018-06-07 (×2): via INTRAVENOUS

## 2018-06-06 MED ORDER — SODIUM CHLORIDE 0.9 % IV SOLN
3.0000 g | Freq: Four times a day (QID) | INTRAVENOUS | Status: DC
  Administered 2018-06-06 – 2018-06-07 (×3): 3 g via INTRAVENOUS
  Filled 2018-06-06 (×5): qty 3

## 2018-06-06 MED ORDER — SODIUM CHLORIDE 0.9 % IV SOLN
3.0000 g | Freq: Once | INTRAVENOUS | Status: AC
  Administered 2018-06-06: 3 g via INTRAVENOUS
  Filled 2018-06-06 (×2): qty 3

## 2018-06-06 NOTE — ED Notes (Signed)
IV TEAM AT BEDSIDE 

## 2018-06-06 NOTE — Progress Notes (Signed)
Pharmacy Antibiotic Note  Alexa Wilson is a 72 y.o. female admitted on 06/06/2018 with possible diverticulitis.  Pharmacy has been consulted for Unasyn dosing.  Plan: Unasyn 3g IV q6h  Height: 5\' 6"  (167.6 cm)(Simultaneous filing. User may not have seen previous data.) Weight: 174 lb (78.9 kg)(Simultaneous filing. User may not have seen previous data.) IBW/kg (Calculated) : 59.3  Temp (24hrs), Avg:97.8 F (36.6 C), Min:97.8 F (36.6 C), Max:97.8 F (36.6 C)  Recent Labs  Lab 06/06/18 0750  WBC 7.3  CREATININE 0.93    Estimated Creatinine Clearance: 58.8 mL/min (by C-G formula based on SCr of 0.93 mg/dL).    Allergies  Allergen Reactions  . Ciprofloxacin Nausea Only  . Codeine Other (See Comments)    Increases anxiety, Makes me feel all whoozie  . Metronidazole Nausea Only  . Morphine And Related Nausea And Vomiting  . Other Other (See Comments)    All antihistamines cause severe anxiety    Antimicrobials this admission: 8/5 Unasyn >>  Dose adjustments this admission:   Microbiology results: 8/5 UCx: sent  Thank you for allowing pharmacy to be a part of this patient's care.  Peggyann Juba, PharmD, BCPS Pager: [phone] 06/06/2018 2:40 PM

## 2018-06-06 NOTE — ED Notes (Addendum)
DELAY IN TRANSFER. IV TEAM AT BEDSIDE. PHARMACY AT BEDSIDE. RN AWARE OF NEED FOR ANTIBIOTIC TO BE GIVEN. FAMILY AND PT AWARE OF BED ASSIGNMENT

## 2018-06-06 NOTE — ED Triage Notes (Signed)
Patient complaining of lower back pain, lower abdominal pain, and is having urinary retention. Patient states these symptoms started two days ago.

## 2018-06-06 NOTE — Progress Notes (Signed)
Patient ID: Alexa Wilson, female   DOB: May 11, 1946, 72 y.o.   MRN: [id number] Patient status post contrast extravasation of approximately 100 cc Isovue into previous right upper arm IV site; she currently denies right arm pain, numbness or drainage from puncture site.  Intact pulses.  There is some edema at the puncture site but no skin breakdown/blistering or bruising at this time.  Recommend the patient continue with arm elevation and intermittent cool compresses to site for remainder of evening.  She was told to contact nursing staff with any acute changes in her arm.  We will continue to monitor.

## 2018-06-06 NOTE — ED Provider Notes (Addendum)
[facility] DEPT Provider Note   CSN: [id number] Arrival date & time: 06/06/18  4196     History   Chief Complaint Chief Complaint  Patient presents with  . Abdominal Pain  . Back Pain  . Urinary Retention    HPI Alexa Wilson is a 72 y.o. female.  HPI  Patient presents with low abdominal pain associated with constipation, and low back pain.  The constipation improved several days ago after she took an enema.  The pain "comes in waves."  The pain has been present for 2 days and worsened during the night causing her to come here today.  She also has low back pain left-sided that radiates to the left buttock and left leg.  There is been no recent trauma.  She denies bowel or bladder incontinence.  There is been no dysuria or hematuria.  She has a rectocele, that worsens when she is constipated, and causes her to feel a large bulge in her vagina.  She denies nausea, vomiting, cough, shortness of breath, chest pain, weakness or dizziness.  She denies recent episodes of diverticulitis.  Previously seen in treated in the ED for diverticulitis and was referred to general surgery, but she never went to see them.  There are no other known modifying factors..  Past Medical History:  Diagnosis Date  . Agoraphobia with panic disorder 1982  . Brown recluse spider bite   . Constipation   . Diverticulitis   . GERD (gastroesophageal reflux disease)   . Hyperlipidemia   . Lyme disease   . Migraines    "don't have them since menopause" (09/23/2016)  . Panic disorder   . Shingles     Patient Active Problem List   Diagnosis Date Noted  . Diverticulitis 09/22/2016  . Acute hypokalemia 09/22/2016  . Hyperlipidemia 11/15/2015  . Constipation 04/09/2015  . Palpitations 08/11/2013  . Chest pressure 07/15/2013  . Right arm weakness 07/15/2013  . Sigmoid diverticulitis 07/14/2012  . DYSPEPSIA 09/06/2009  . IBS 09/06/2009  . COLONIC POLYPS, HX OF 09/06/2009  .  WEIGHT LOSS 06/07/2009  . NAUSEA AND VOMITING 06/07/2009  . ABDOMINAL PAIN 06/07/2009  . EPIGASTRIC PAIN 06/07/2009  . COLONIC POLYPS 02/21/2009  . DYSPHAGIA 02/21/2009  . PANIC DISORDER 02/20/2009  . CATARACTS 02/20/2009  . DIVERTICULOSIS OF COLON 02/20/2009  . HELICOBACTER PYLORI GASTRITIS, HX OF 02/20/2009    Past Surgical History:  Procedure Laterality Date  . APPENDECTOMY  1964  . CHOLECYSTECTOMY OPEN  1966  . COLONOSCOPY  2012   RMR: 1. External hemorrhoids, otherwise normal rectum. 2. Left-sided diverticula status post snare polypectomy left colon polyp. Remainder of the colonic mucosa and terminal ileum mucoa appeared normal.   . COLONOSCOPY N/A 02/11/2017   Procedure: COLONOSCOPY;  Surgeon: Daneil Dolin, MD;  Location: [facility];  Service: Endoscopy;  Laterality: N/A;  1200  . DILATION AND CURETTAGE OF UTERUS    . TONSILLECTOMY  1961     OB History   None      Home Medications    Prior to Admission medications   Medication Sig Start Date End Date Taking? Authorizing Provider  acetaminophen (TYLENOL) 500 MG tablet Take 500 mg by mouth daily as needed for moderate pain or headache.   Yes [provider]  alprazolam Duanne Moron) 2 MG tablet Take 1 mg by mouth every 6 (six) hours.   Yes [provider]  Bisacodyl (LAXATIVE PO) Take 1 tablet by mouth daily as needed (constipation).  Yes [provider]  Cholecalciferol (VITAMIN D3) 2000 units TABS Take by mouth daily.    Yes [provider]  Docusate Calcium (STOOL SOFTENER PO) Take 1 tablet by mouth daily as needed (constipation).   Yes [provider]  metoprolol succinate (TOPROL-XL) 25 MG 24 hr tablet Take 0.5 tablets (12.5 mg total) by mouth daily. NEED OV. 02/24/18  Yes Crenshaw, Denice Bors, MD  ARTIFICIAL TEAR OP Apply 1 drop to eye daily as needed (dry eyes).    [provider]  dicyclomine (BENTYL) 10 MG capsule Take 1 capsule (10 mg total) 3 (three) times daily  as needed by mouth (for soft/loose stools). Patient not taking: Reported on 06/06/2018 09/15/17   Carlis Stable, NP  moxifloxacin (AVELOX) 400 MG tablet Take 1 tablet (400 mg total) by mouth daily at 8 pm. Patient not taking: Reported on 06/06/2018 10/20/17   Ripley Fraise, MD  polyethylene glycol-electrolytes (TRILYTE) 420 g solution Take 4,000 mLs by mouth as directed. Patient not taking: Reported on 06/06/2018 01/06/17   Daneil Dolin, MD  rosuvastatin (CRESTOR) 10 MG tablet Take 10 mg daily by mouth.    [provider]    Family History Family History  Problem Relation Age of Onset  . Diabetes Mother   . Hypertension Mother   . Heart attack Mother   . Cancer Father        Melanoma   . Diabetes Brother   . Diabetes Son   . Colon cancer Neg Hx     Social History Social History   Tobacco Use  . Smoking status: Never Smoker  . Smokeless tobacco: Never Used  Substance Use Topics  . Alcohol use: No    Alcohol/week: 0.0 oz  . Drug use: No     Allergies   Ciprofloxacin; Codeine; Metronidazole; Morphine and related; and Other   Review of Systems Review of Systems  All other systems reviewed and are negative.    Physical Exam Updated Vital Signs BP (!) 144/75 (BP Location: Left Arm)   Pulse 72   Temp 98.3 F (36.8 C) (Oral)   Resp 16   Ht 5\' 6"  (1.676 m)   Wt 81.7 kg (180 lb 1.9 oz)   SpO2 100%   BMI 29.07 kg/m   Physical Exam  Constitutional: She is oriented to person, place, and time. She appears well-developed. She does not appear ill.  Elderly, frail  HENT:  Head: Normocephalic and atraumatic.  Eyes: Pupils are equal, round, and reactive to light. Conjunctivae and EOM are normal.  Neck: Normal range of motion and phonation normal. Neck supple.  Cardiovascular: Normal rate and regular rhythm.  Pulmonary/Chest: Effort normal and breath sounds normal. She exhibits no tenderness.  Abdominal: Soft. She exhibits no distension and no mass. There is  tenderness (At right lower quadrants, mild.). There is no rebound and no guarding.  Genitourinary:  Genitourinary Comments: Mild left-sided costovertebral angle tenderness with percussion.  No tenderness of the paravertebral musculature.  Normal range of motion back, arms and legs.  Musculoskeletal: Normal range of motion.  Normal gait  Neurological: She is alert and oriented to person, place, and time. She exhibits normal muscle tone.  Skin: Skin is warm and dry.  Psychiatric: She has a normal mood and affect. Her behavior is normal. Judgment and thought content normal.  Nursing note and vitals reviewed.    ED Treatments / Results  Labs (all labs ordered are listed, but only abnormal results are displayed) Labs  Reviewed  URINE CULTURE  LIPASE, BLOOD  COMPREHENSIVE METABOLIC PANEL  CBC  URINALYSIS, ROUTINE W REFLEX MICROSCOPIC    EKG None  Radiology Ct Abdomen Pelvis Wo Contrast  Result Date: 06/06/2018 CLINICAL DATA:  Lower abdominal pain associated with constipation. Low back pain. Low back pain on the left radiating to the left leg. No recent trauma. History of rectocele. The study was initially ordered with contrast. 100 mL of Isovue-300 extravasated into the patient's right arm. Contrast was not readministered. EXAM: CT ABDOMEN AND PELVIS WITHOUT CONTRAST TECHNIQUE: Multidetector CT imaging of the abdomen and pelvis was performed following the standard protocol without IV contrast. COMPARISON:  October 20, 2017 FINDINGS: Lower chest: There is a small hiatal hernia. No other abnormalities identified in the lung bases. Hepatobiliary: No focal liver abnormality is seen. Status post cholecystectomy. No biliary dilatation. Pancreas: Unremarkable. No pancreatic ductal dilatation or surrounding inflammatory changes. Spleen: Normal in size without focal abnormality. Adrenals/Urinary Tract: Adrenal glands are unremarkable. Kidneys are normal, without renal calculi, focal lesion, or  hydronephrosis. Bladder is unremarkable. Stomach/Bowel: The stomach and small bowel are normal. Colonic diverticuli are seen, most numerous in the sigmoid colon. There is wall thickening and pericolonic stranding associated with the sigmoid colon. The remainder of the colon is normal. The appendix is not visualized but there is no secondary evidence of appendicitis. Vascular/Lymphatic: Atherosclerotic changes are seen in the nonaneurysmal aorta and iliac vessels. No adenopathy. Reproductive: Uterus and bilateral adnexa are unremarkable. Other: Tiny amount of reactive fluid is seen in the pelvis. No other abnormalities. Musculoskeletal: No acute or significant osseous findings. IMPRESSION: 1. There is wall thickening in the sigmoid colon with adjacent fat stranding. Multiple diverticuli are identified in this region. Findings are consistent with diverticulitis. Recommend follow-up to resolution. 2. Atherosclerotic changes in the nonaneurysmal aorta. Electronically Signed   By: Dorise Bullion III M.D   On: 06/06/2018 12:39    Procedures Procedures (including critical care time)  Medications Ordered in ED Medications  iopamidol (ISOVUE-300) 61 % injection (has no administration in time range)  Ampicillin-Sulbactam (UNASYN) 3 g in sodium chloride 0.9 % 100 mL IVPB (has no administration in time range)  Ampicillin-Sulbactam (UNASYN) 3 g in sodium chloride 0.9 % 100 mL IVPB (has no administration in time range)  iopamidol (ISOVUE-300) 61 % injection 100 mL (100 mLs Intravenous Contrast Given 06/06/18 1159)     Initial Impression / Assessment and Plan / ED Course  I have reviewed the triage vital signs and the nursing notes.  Pertinent labs & imaging results that were available during my care of the patient were reviewed by me and considered in my medical decision making (see chart for details).  Clinical Course as of Jun 06 1542  [facility] Jun 06, 2018  1015 Normal  Comprehensive metabolic panel [EW]    8938 Normal  Urinalysis, Routine w reflex microscopic [EW]  1016 Normal  Lipase, blood [EW]  1016 Normal  CBC [EW]    Clinical Course User Index [EW] Daleen Bo, MD     Patient Vitals for the past 24 hrs:  BP Temp Temp src Pulse Resp SpO2 Height Weight  06/06/18 1535 (!) 144/75 98.3 F (36.8 C) Oral 72 16 100 % 5\' 6"  (1.676 m) 81.7 kg (180 lb 1.9 oz)  06/06/18 1457 129/69 - - 70 18 100 % - -  06/06/18 1026 136/69 - - 74 18 100 % - -  06/06/18 0704 (!) 153/80 97.8 F (36.6 C) Oral 77 16 99 %  5\' 6"  (1.676 m) 78.9 kg (174 lb)    10:16 AM Reevaluation with update and discussion. After initial assessment and treatment, an updated evaluation reveals she is fairly comfortable and has continued to have some occasional "waves of pain."  She states that she uses a stool softener occasionally but has not used it recently.  Findings discussed with the patient and all questions answered. Daleen Bo   Medical Decision Making: Abdominal discomfort, unlikely to be from diverticulitis with normal temperature and normal laboratory evaluation.  Doubt intestinal blockage, metabolic instability or serious bacterial infection.  CRITICAL CARE-no Performed by: Daleen Bo   Nursing Notes Reviewed/ Care Coordinated Applicable Imaging Reviewed Interpretation of Laboratory Data incorporated into ED treatment  1:21 PM Reevaluation with update and discussion. After initial assessment and treatment, an updated evaluation reveals patient continues to complain of low abdominal pain.  She also now complains of right upper arm pain, at the site of IV contrast extravasation.  An ice pack has been applied and patient has been given instructions on how to care for contrast extravasation injury.  Patient and her son would like to be admitted to the hospital for further treatment at this time. Daleen Bo   1:25 PM-Consult complete with hospitalist. Patient case explained and discussed.  Hospitalist agrees  to admit patient for further evaluation and treatment. Call ended at 1:35 PM   Final Clinical Impressions(s) / ED Diagnoses   Final diagnoses:  Abdominal pain, unspecified abdominal location  Constipation, unspecified constipation type  Diverticulitis  X-ray contrast agent causing adverse effect in therapeutic use, initial encounter    ED Discharge Orders    None       Daleen Bo, MD 06/06/18 1018  Patient's son returned to the room after discharge the patient, and asked about getting a CAT scan for evaluation of possible diverticulitis.  I then had a discussion with the patient's son and the patient regarding my decision making and plans.  Patient and her son discussed the possibility of ordering a scan, and could not initially make a decision.  I came back to the room about 7 minutes later, to see if they had arrived at a decision.  At that point the patient had brought to the bathroom and her son was in the room, and upset by "some disrespectful comments that were made."  He states he overheard someone in the hall talking.  They said things about him being a patient.  I asked the son if there was anything I could do to help and he said "no just get her discharged."  I have that the patient talk to someone else and he declined.    Daleen Bo, MD 06/06/18 1056  11:15 AM -at this point the patient's son and the patient want to proceed with a CT of the abdomen pelvis to evaluate for diverticulitis.    Daleen Bo, MD 06/06/18 [phone]

## 2018-06-06 NOTE — Discharge Instructions (Addendum)
Your abdominal pain is likely secondary to intestinal cramping with constipation.  To help improve regularity and soften stools, use your stool softener, docusate sodium, 100 mg twice a day, for 2 or 3 weeks.  To encourage stooling make sure you are drinking 2 or 3 glasses of water each day and eat a high-fiber diet.

## 2018-06-06 NOTE — H&P (Addendum)
History and Physical    Alexa Wilson:[id number] DOB: 12/10/1945 DOA: 06/06/2018  PCP: Glenda Chroman, MD  Patient coming from: home  I have personally briefly reviewed patient's old medical records in [place]  Chief Complaint: abdominal pain  HPI: Alexa Wilson is Alexa Wilson 72 y.o. female with medical history significant of anxiety, depression, diverticulitis, GERD and multiple other medical problems presenting with abdominal pain and imaging findings c/w diverticulitis.  Patient notes that her symptoms started on Saturday.  She describes this as crampy pain in her lower abdomen.  This was not bad for started, but has gotten progressively worse.  This morning around 4 5 AM she had pain so severe that she came to the emergency department.  She denies any fevers, but notes chills.  She denies any chest pain or shortness of breath.  She denies any nausea or vomiting.  She notes her appetite is okay.  She states her first episode of diverticulitis occurred in around 2000.  She notes that since then she has had about 2 flares Alexa Wilson year.    ED Course: Labs, CT, abx.  Admit for diverticulitis.  Review of Systems: As per HPI otherwise 10 point review of systems negative.   Past Medical History:  Diagnosis Date  . Agoraphobia with panic disorder 1982  . Brown recluse spider bite   . Constipation   . Diverticulitis   . GERD (gastroesophageal reflux disease)   . Hyperlipidemia   . Lyme disease   . Migraines    "don't have them since menopause" (09/23/2016)  . Panic disorder   . Shingles     Past Surgical History:  Procedure Laterality Date  . APPENDECTOMY  1964  . CHOLECYSTECTOMY OPEN  1966  . COLONOSCOPY  2012   RMR: 1. External hemorrhoids, otherwise normal rectum. 2. Left-sided diverticula status post snare polypectomy left colon polyp. Remainder of the colonic mucosa and terminal ileum mucoa appeared normal.   . COLONOSCOPY N/Alexa Wilson 02/11/2017   Procedure: COLONOSCOPY;  Surgeon:  Alexa Dolin, MD;  Location[facility];  Service: Endoscopy;  Laterality: N/Alexa Wilson;  1200  . DILATION AND CURETTAGE OF UTERUS    . TONSILLECTOMY  1961     reports that she has never smoked. She has never used smokeless tobacco. She reports that she does not drink alcohol or use drugs.  Allergies  Allergen Reactions  . Ciprofloxacin Nausea Only  . Codeine Other (See Comments)    Increases anxiety, Makes me feel all whoozie  . Metronidazole Nausea Only  . Morphine And Related Nausea And Vomiting  . Other Other (See Comments)    All antihistamines cause severe anxiety    Family History  Problem Relation Age of Onset  . Diabetes Mother   . Hypertension Mother   . Heart attack Mother   . Cancer Father        Melanoma   . Diabetes Brother   . Diabetes Son   . Colon cancer Neg Hx    Prior to Admission medications   Medication Sig Start Date End Date Taking? Authorizing Provider  acetaminophen (TYLENOL) 500 MG tablet Take 500 mg by mouth daily as needed for moderate pain or headache.   Yes [provider]  alprazolam Alexa Wilson) 2 MG tablet Take 1 mg by mouth every 6 (six) hours.   Yes [provider]  Bisacodyl (LAXATIVE PO) Take 1 tablet by mouth daily as needed (constipation).   Yes [provider]  Cholecalciferol (  VITAMIN D3) 2000 units TABS Take by mouth daily.    Yes [provider]  Docusate Calcium (STOOL SOFTENER PO) Take 1 tablet by mouth daily as needed (constipation).   Yes [provider]  metoprolol succinate (TOPROL-XL) 25 MG 24 hr tablet Take 0.5 tablets (12.5 mg total) by mouth daily. NEED OV. 02/24/18  Yes Crenshaw, Alexa Bors, MD  ARTIFICIAL TEAR OP Apply 1 drop to eye daily as needed (dry eyes).    [provider]  dicyclomine (BENTYL) 10 MG capsule Take 1 capsule (10 mg total) 3 (three) times daily as needed by mouth (for soft/loose stools). Patient not taking: Reported on 06/06/2018 09/15/17   Alexa Stable, NP    moxifloxacin (AVELOX) 400 MG tablet Take 1 tablet (400 mg total) by mouth daily at 8 pm. Patient not taking: Reported on 06/06/2018 10/20/17   Ripley Fraise, MD  polyethylene glycol-electrolytes (TRILYTE) 420 g solution Take 4,000 mLs by mouth as directed. Patient not taking: Reported on 06/06/2018 01/06/17   Alexa Dolin, MD  rosuvastatin (CRESTOR) 10 MG tablet Take 10 mg daily by mouth.    [provider]    Physical Exam: Vitals:   06/06/18 0704 06/06/18 1026 06/06/18 1457 06/06/18 1535  BP: (!) 153/80 136/69 129/69 (!) 144/75  Pulse: 77 74 70 72  Resp: 16 18 18 16   Temp: 97.8 F (36.6 C)   98.3 F (36.8 C)  TempSrc: Oral   Oral  SpO2: 99% 100% 100% 100%  Weight: 78.9 kg (174 lb)   81.7 kg (180 lb 1.9 oz)  Height: 5\' 6"  (1.676 m)   5\' 6"  (1.676 m)    Constitutional: NAD, calm, comfortable Vitals:   06/06/18 0704 06/06/18 1026 06/06/18 1457 06/06/18 1535  BP: (!) 153/80 136/69 129/69 (!) 144/75  Pulse: 77 74 70 72  Resp: 16 18 18 16   Temp: 97.8 F (36.6 C)   98.3 F (36.8 C)  TempSrc: Oral   Oral  SpO2: 99% 100% 100% 100%  Weight: 78.9 kg (174 lb)   81.7 kg (180 lb 1.9 oz)  Height: 5\' 6"  (1.676 m)   5\' 6"  (1.676 m)   Eyes: PERRL, lids and conjunctivae normal ENMT: Mucous membranes are moist. Posterior pharynx clear of any exudate or lesions.Normal dentition.  Neck: normal, supple, no masses, no thyromegaly Respiratory: clear to auscultation bilaterally, no wheezing, no crackles. Normal respiratory effort. No accessory muscle use.  Cardiovascular: Regular rate and rhythm, no murmurs / rubs / gallops. No extremity edema. 2+ pedal pulses. No carotid bruits.  Abdomen: diffuse TTP to lower abdomen, no rebound or guarding, no masses palpated. No hepatosplenomegaly. Bowel sounds positive.  Musculoskeletal: no clubbing / cyanosis. No joint deformity upper and lower extremities. Good ROM, no contractures. Normal muscle tone.  Skin: no rashes, lesions, ulcers. No  induration Neurologic: CN 2-12 grossly intact. Sensation intact. Strength 5/5 in all 4.  Psychiatric: Normal judgment and insight. Alert and oriented x 3. Normal mood.   Labs on Admission: I have personally reviewed following labs and imaging studies  CBC: Recent Labs  Lab 06/06/18 0750  WBC 7.3  HGB 13.0  HCT 39.8  MCV 98.5  PLT 850   Basic Metabolic Panel: Recent Labs  Lab 06/06/18 0750  NA 141  K 3.6  CL 106  CO2 27  GLUCOSE 95  BUN 13  CREATININE 0.93  CALCIUM 9.0   GFR: Estimated Creatinine Clearance: 59.8 mL/min (by C-G formula based on SCr of 0.93 mg/dL).  Liver Function Tests: Recent Labs  Lab 06/06/18 0750  AST 29  ALT 18  ALKPHOS 87  BILITOT 0.7  PROT 7.6  ALBUMIN 4.2   Recent Labs  Lab 06/06/18 0750  LIPASE 43   No results for input(s): AMMONIA in the last 168 hours. Coagulation Profile: No results for input(s): INR, PROTIME in the last 168 hours. Cardiac Enzymes: No results for input(s): CKTOTAL, CKMB, CKMBINDEX, TROPONINI in the last 168 hours. BNP (last 3 results) No results for input(s): PROBNP in the last 8760 hours. HbA1C: No results for input(s): HGBA1C in the last 72 hours. CBG: No results for input(s): GLUCAP in the last 168 hours. Lipid Profile: No results for input(s): CHOL, HDL, LDLCALC, TRIG, CHOLHDL, LDLDIRECT in the last 72 hours. Thyroid Function Tests: No results for input(s): TSH, T4TOTAL, FREET4, T3FREE, THYROIDAB in the last 72 hours. Anemia Panel: No results for input(s): VITAMINB12, FOLATE, FERRITIN, TIBC, IRON, RETICCTPCT in the last 72 hours. Urine analysis:    Component Value Date/Time   COLORURINE YELLOW 06/06/2018 0811   APPEARANCEUR CLEAR 06/06/2018 0811   LABSPEC 1.011 06/06/2018 0811   PHURINE 5.0 06/06/2018 0811   GLUCOSEU NEGATIVE 06/06/2018 0811   HGBUR NEGATIVE 06/06/2018 0811   BILIRUBINUR NEGATIVE 06/06/2018 0811   KETONESUR NEGATIVE 06/06/2018 0811   PROTEINUR NEGATIVE 06/06/2018 0811    UROBILINOGEN 0.2 02/19/2014 0850   NITRITE NEGATIVE 06/06/2018 0811   LEUKOCYTESUR NEGATIVE 06/06/2018 0811    Radiological Exams on Admission: Ct Abdomen Pelvis Wo Contrast  Result Date: 06/06/2018 CLINICAL DATA:  Lower abdominal pain associated with constipation. Low back pain. Low back pain on the left radiating to the left leg. No recent trauma. History of rectocele. The study was initially ordered with contrast. 100 mL of Isovue-300 extravasated into the patient's right arm. Contrast was not readministered. EXAM: CT ABDOMEN AND PELVIS WITHOUT CONTRAST TECHNIQUE: Multidetector CT imaging of the abdomen and pelvis was performed following the standard protocol without IV contrast. COMPARISON:  October 20, 2017 FINDINGS: Lower chest: There is Shawntrice Salle small hiatal hernia. No other abnormalities identified in the lung bases. Hepatobiliary: No focal liver abnormality is seen. Status post cholecystectomy. No biliary dilatation. Pancreas: Unremarkable. No pancreatic ductal dilatation or surrounding inflammatory changes. Spleen: Normal in size without focal abnormality. Adrenals/Urinary Tract: Adrenal glands are unremarkable. Kidneys are normal, without renal calculi, focal lesion, or hydronephrosis. Bladder is unremarkable. Stomach/Bowel: The stomach and small bowel are normal. Colonic diverticuli are seen, most numerous in the sigmoid colon. There is wall thickening and pericolonic stranding associated with the sigmoid colon. The remainder of the colon is normal. The appendix is not visualized but there is no secondary evidence of appendicitis. Vascular/Lymphatic: Atherosclerotic changes are seen in the nonaneurysmal aorta and iliac vessels. No adenopathy. Reproductive: Uterus and bilateral adnexa are unremarkable. Other: Tiny amount of reactive fluid is seen in the pelvis. No other abnormalities. Musculoskeletal: No acute or significant osseous findings. IMPRESSION: 1. There is wall thickening in the sigmoid colon  with adjacent fat stranding. Multiple diverticuli are identified in this region. Findings are consistent with diverticulitis. Recommend follow-up to resolution. 2. Atherosclerotic changes in the nonaneurysmal aorta. Electronically Signed   By: Dorise Bullion III M.D   On: 06/06/2018 12:39    EKG: Independently reviewed. none  Assessment/Plan Active Problems:   Diverticulitis   Diverticulitis:  Pt with abdominal pain and CT evidence of diverticulitis.  Significant pain leading to presentation to ED.  No fever or WBC count. Unasyn Pain control Antiemetics IVF Will  need to f/u with GI as outpatient for recurrent diverticulitis  RUE contrast extravasation: some swelling to RUE, continue to monitor  Anxiety  Depression: continue xanax prn.  PMP aware reviewed.  HLD: pt not taking crestor due to side effects  DVT prophylaxis: lovenox Code Status: full code  Family Communication: son at bedside  Disposition Plan: possibly tomorrow  Consults called: none  Admission status: observation    Fayrene Helper MD [facility] Pager [phone]  If 7PM-7AM, please contact night-coverage [email or web address] Password [email or web address]  06/06/2018, 4:15 PM

## 2018-06-06 NOTE — ED Notes (Addendum)
This writer went in to take IV out and do discharge vital signs. Did not take IV out due to patient's visitor stating  "patient may have diverticulitis and she should have gotten a CT scan." Patient stated "well, they checked blood, and urine, they didn't do a CT scan." Vitals done, patient IV still in place in case furtther testing was required.Sought advice of RN [employer]. Was told to leave IV in until Dr Eulis Foster and RN Caryl Pina were able to speak with patient and visitor. RN Caryl Pina notified.

## 2018-06-06 NOTE — Progress Notes (Signed)
CT scan completed; Report rec'd from ED & Pt will get a new IV prior to [place] to floor.

## 2018-06-07 ENCOUNTER — Ambulatory Visit: Payer: Medicare Other | Admitting: Cardiology

## 2018-06-07 DIAGNOSIS — K5792 Diverticulitis of intestine, part unspecified, without perforation or abscess without bleeding: Secondary | ICD-10-CM

## 2018-06-07 DIAGNOSIS — K5732 Diverticulitis of large intestine without perforation or abscess without bleeding: Secondary | ICD-10-CM | POA: Diagnosis not present

## 2018-06-07 LAB — COMPREHENSIVE METABOLIC PANEL
ALK PHOS: 75 U/L (ref 38–126)
ALT: 12 U/L (ref 0–44)
AST: 20 U/L (ref 15–41)
Albumin: 3.2 g/dL — ABNORMAL LOW (ref 3.5–5.0)
Anion gap: 6 (ref 5–15)
BUN: 10 mg/dL (ref 8–23)
CALCIUM: 8.7 mg/dL — AB (ref 8.9–10.3)
CO2: 27 mmol/L (ref 22–32)
Chloride: 108 mmol/L (ref 98–111)
Creatinine, Ser: 0.72 mg/dL (ref 0.44–1.00)
GFR calc non Af Amer: >60 mL/min (ref >60)
Glucose, Bld: 90 mg/dL (ref 70–99)
Potassium: 3.6 mmol/L (ref 3.5–5.1)
SODIUM: 141 mmol/L (ref 135–145)
TOTAL PROTEIN: 6.2 g/dL — AB (ref 6.5–8.1)
Total Bilirubin: 1 mg/dL (ref 0.3–1.2)

## 2018-06-07 LAB — CBC
HCT: 36.1 % (ref 36.0–46.0)
HEMOGLOBIN: 11.8 g/dL — AB (ref 12.0–15.0)
MCH: 32.1 pg (ref 26.0–34.0)
MCHC: 32.7 g/dL (ref 30.0–36.0)
MCV: 98.1 fL (ref 78.0–100.0)
Platelets: 279 10*3/uL (ref 150–400)
RBC: 3.68 MIL/uL — ABNORMAL LOW (ref 3.87–5.11)
RDW: 13.2 % (ref 11.5–15.5)
WBC: 5.1 10*3/uL (ref 4.0–10.5)

## 2018-06-07 LAB — URINE CULTURE: Culture: NO GROWTH

## 2018-06-07 MED ORDER — METOPROLOL SUCCINATE ER 25 MG PO TB24: 12.5000 mg | ORAL_TABLET | Freq: Every day | ORAL | Status: DC

## 2018-06-07 MED ORDER — AMOXICILLIN-POT CLAVULANATE 875-125 MG PO TABS: 1.0000 | ORAL_TABLET | Freq: Three times a day (TID) | ORAL | 0 refills | Status: AC

## 2018-06-07 MED ORDER — DOCUSATE SODIUM 100 MG PO CAPS: 100.0000 mg | ORAL_CAPSULE | Freq: Two times a day (BID) | ORAL | 0 refills | Status: DC

## 2018-06-07 NOTE — Progress Notes (Signed)
Patient is discharged, she is stable and free of pain. Paperwork was explained and signed

## 2018-06-07 NOTE — Discharge Summary (Signed)
Physician Discharge Summary  Alexa Wilson WER:[id number] DOB: 1946-11-01 DOA: 06/06/2018  PCP: Glenda Chroman, MD  Admit date: 06/06/2018 Discharge date: 06/07/2018  Admitted From: Home Disposition:  Home  Discharge Condition:Stable CODE STATUS:FULL Diet recommendation: Heart Healthy   Brief/Interim Summary: HPI: Alexa Wilson is a 72 y.o. female with medical history significant of anxiety, depression, diverticulitis, GERD and multiple other medical problems presenting with abdominal pain and imaging findings c/w diverticulitis.  Patient notes that her symptoms started on Saturday.  She describes this as crampy pain in her lower abdomen.  This was not bad for started, but has gotten progressively worse.  This morning around 4 5 AM she had pain so severe that she came to the emergency department.  She denies any fevers, but notes chills.  She denies any chest pain or shortness of breath.  She denies any nausea or vomiting.  She notes her appetite is okay.  She states her first episode of diverticulitis occurred in around 2000.  She notes that since then she has had about 2 flares a year.    Hospital Course: On hospital course remained stable.  Abdominal pain has much improved this morning.  She had a bowel movement.  Patient is afebrile and is hemodynamically stable.  She is able to tolerate her clear liquid diet so we have advance her diet to soft.  Patient is stable for discharge to home with oral antibiotics . She will continue oral antibiotics for next 7 days.  She needs  to follow-up with her PCP in a week and has to follow-up with a gastroenterologist in 6 to 8 weeks for colonoscopy after resolution of diverticulitis.  Following problems were addressed during her hospitalization:  Diverticulitis:  Pt with abdominal pain and CT evidence of diverticulitis.  Significant pain leading to presentation to ED.  No fever or WBC count. Started on Unasyn.  Changed to Augmentin on discharge.  She  does not have any fever today.  White cell counts are normal.  Hemodynamically stable She  needs to f/u with GI as outpatient for recurrent diverticulitis. We have advised her to take soft diet for next 3 to 5 days after that seems to be on high-fiber diet.  We recommend to continue medications like Colace for constipation.  RUE contrast extravasation: some swelling to RUE, continue to monitor.  Much improved this morning  Anxiety  Depression: continue xanax prn.    HLD: pt not taking crestor due to side effects    Discharge Diagnoses:  Active Problems:   Abdominal pain   Diverticulitis    Discharge Instructions  Discharge Instructions    Diet - low sodium heart healthy   Complete by:  As directed    Discharge instructions   Complete by:  As directed    1) Take prescribed medications as instructed. 2) Follow-up with your PCP in a week.  Follow-up with a gastroenterologist in 6 to 8 weeks through the referral of your PCP for colonoscopy. 3) Take soft diet for next 3 to 5 days.  After that continue taking high-fiber diet.   Increase activity slowly   Complete by:  As directed      Allergies as of 06/07/2018      Reactions   Ciprofloxacin Nausea Only   Codeine Other (See Comments)   Increases anxiety, Makes me feel all whoozie   Metronidazole Nausea Only   Morphine And Related Nausea And Vomiting   Other Other (See Comments)   All antihistamines  cause severe anxiety      Medication List    STOP taking these medications   moxifloxacin 400 MG tablet Commonly known as:  AVELOX     TAKE these medications   acetaminophen 500 MG tablet Commonly known as:  TYLENOL Take 500 mg by mouth daily as needed for moderate pain or headache.   alprazolam 2 MG tablet Commonly known as:  XANAX Take 1 mg by mouth every 6 (six) hours.   amoxicillin-clavulanate 875-125 MG tablet Commonly known as:  AUGMENTIN Take 1 tablet by mouth 3 (three) times daily for 7 days.   ARTIFICIAL  TEAR OP Apply 1 drop to eye daily as needed (dry eyes).   dicyclomine 10 MG capsule Commonly known as:  BENTYL Take 1 capsule (10 mg total) 3 (three) times daily as needed by mouth (for soft/loose stools).   docusate sodium 100 MG capsule Commonly known as:  STOOL SOFTENER Take 1 capsule (100 mg total) by mouth 2 (two) times daily. What changed:    medication strength  how much to take  when to take this  reasons to take this   LAXATIVE PO Take 1 tablet by mouth daily as needed (constipation).   metoprolol succinate 25 MG 24 hr tablet Commonly known as:  TOPROL-XL Take 0.5 tablets (12.5 mg total) by mouth daily. NEED OV.   polyethylene glycol-electrolytes 420 g solution Commonly known as:  TRILYTE Take 4,000 mLs by mouth as directed.   rosuvastatin 10 MG tablet Commonly known as:  CRESTOR Take 10 mg daily by mouth.   Vitamin D3 2000 units Tabs Take by mouth daily.      Follow-up Information    Vyas, Dhruv B, MD. Schedule an appointment as soon as possible for a visit in 1 week(s).   Specialty:  Internal Medicine Why:  As needed Contact information: [place] [address] 336 [phone]          Allergies  Allergen Reactions  . Ciprofloxacin Nausea Only  . Codeine Other (See Comments)    Increases anxiety, Makes me feel all whoozie  . Metronidazole Nausea Only  . Morphine And Related Nausea And Vomiting  . Other Other (See Comments)    All antihistamines cause severe anxiety    Consultations: None  Procedures/Studies: Ct Abdomen Pelvis Wo Contrast  Result Date: 06/06/2018 CLINICAL DATA:  Lower abdominal pain associated with constipation. Low back pain. Low back pain on the left radiating to the left leg. No recent trauma. History of rectocele. The study was initially ordered with contrast. 100 mL of Isovue-300 extravasated into the patient's right arm. Contrast was not readministered. EXAM: CT ABDOMEN AND PELVIS WITHOUT CONTRAST TECHNIQUE:  Multidetector CT imaging of the abdomen and pelvis was performed following the standard protocol without IV contrast. COMPARISON:  October 20, 2017 FINDINGS: Lower chest: There is a small hiatal hernia. No other abnormalities identified in the lung bases. Hepatobiliary: No focal liver abnormality is seen. Status post cholecystectomy. No biliary dilatation. Pancreas: Unremarkable. No pancreatic ductal dilatation or surrounding inflammatory changes. Spleen: Normal in size without focal abnormality. Adrenals/Urinary Tract: Adrenal glands are unremarkable. Kidneys are normal, without renal calculi, focal lesion, or hydronephrosis. Bladder is unremarkable. Stomach/Bowel: The stomach and small bowel are normal. Colonic diverticuli are seen, most numerous in the sigmoid colon. There is wall thickening and pericolonic stranding associated with the sigmoid colon. The remainder of the colon is normal. The appendix is not visualized but there is no secondary evidence of appendicitis. Vascular/Lymphatic:  Atherosclerotic changes are seen in the nonaneurysmal aorta and iliac vessels. No adenopathy. Reproductive: Uterus and bilateral adnexa are unremarkable. Other: Tiny amount of reactive fluid is seen in the pelvis. No other abnormalities. Musculoskeletal: No acute or significant osseous findings. IMPRESSION: 1. There is wall thickening in the sigmoid colon with adjacent fat stranding. Multiple diverticuli are identified in this region. Findings are consistent with diverticulitis. Recommend follow-up to resolution. 2. Atherosclerotic changes in the nonaneurysmal aorta. Electronically Signed   By: Dorise Bullion III M.D   On: 06/06/2018 12:39      Discharge Exam: Vitals:   06/06/18 2118 06/07/18 0507  BP: 131/69 112/62  Pulse: 65 63  Resp: 15 14  Temp: 98.3 F (36.8 C) 97.7 F (36.5 C)  SpO2: 99% 98%   Vitals:   06/06/18 1457 06/06/18 1535 06/06/18 2118 06/07/18 0507  BP: 129/69 (!) 144/75 131/69 112/62   Pulse: 70 72 65 63  Resp: 18 16 15 14   Temp:  98.3 F (36.8 C) 98.3 F (36.8 C) 97.7 F (36.5 C)  TempSrc:  Oral Oral Oral  SpO2: 100% 100% 99% 98%  Weight:  81.7 kg (180 lb 1.9 oz)    Height:  5\' 6"  (1.676 m)      General: Pt is alert, awake, not in acute distress Cardiovascular: RRR, S1/S2 +, no rubs, no gallops Respiratory: CTA bilaterally, no wheezing, no rhonchi Abdominal: Soft, NT, ND, bowel sounds + Extremities: no edema, no cyanosis    The results of significant diagnostics from this hospitalization (including imaging, microbiology, ancillary and laboratory) are listed below for reference.     Microbiology: Recent Results (from the past 240 hour(s))  Urine culture     Status: None   Collection Time: 06/06/18  8:11 AM  Result Value Ref Range Status   Specimen Description   Final    URINE, CLEAN CATCH Performed at [facility], [place] [address] [place], [place] [address]    Special Requests   Final    NONE Performed at [facility], [place] [address] [place], [place] [address]    Culture   Final    NO GROWTH Performed at [facility], [place] [address], [place], [place] [address]    Report Status 06/07/2018 FINAL  Final     Labs: BNP (last 3 results) No results for input(s): BNP in the last 8760 hours. Basic Metabolic Panel: Recent Labs  Lab 06/06/18 0750 06/07/18 0402  NA 141 141  K 3.6 3.6  CL 106 108  CO2 27 27  GLUCOSE 95 90  BUN 13 10  CREATININE 0.93 0.72  CALCIUM 9.0 8.7*   Liver Function Tests: Recent Labs  Lab 06/06/18 0750 06/07/18 0402  AST 29 20  ALT 18 12  ALKPHOS 87 75  BILITOT 0.7 1.0  PROT 7.6 6.2*  ALBUMIN 4.2 3.2*   Recent Labs  Lab 06/06/18 0750  LIPASE 43   No results for input(s): AMMONIA in the last 168 hours. CBC: Recent Labs  Lab 06/06/18 0750 06/07/18 0402  WBC 7.3 5.1  HGB 13.0 11.8*  HCT 39.8 36.1  MCV 98.5 98.1  PLT 289 279   Cardiac Enzymes: No results  for input(s): CKTOTAL, CKMB, CKMBINDEX, TROPONINI in the last 168 hours. BNP: Invalid input(s): POCBNP CBG: No results for input(s): GLUCAP in the last 168 hours. D-Dimer No results for input(s): DDIMER in the last 72 hours. Hgb A1c No results for input(s): HGBA1C in the last 72  hours. Lipid Profile No results for input(s): CHOL, HDL, LDLCALC, TRIG, CHOLHDL, LDLDIRECT in the last 72 hours. Thyroid function studies No results for input(s): TSH, T4TOTAL, T3FREE, THYROIDAB in the last 72 hours.  Invalid input(s): FREET3 Anemia work up No results for input(s): VITAMINB12, FOLATE, FERRITIN, TIBC, IRON, RETICCTPCT in the last 72 hours. Urinalysis    Component Value Date/Time   COLORURINE YELLOW 06/06/2018 0811   APPEARANCEUR CLEAR 06/06/2018 0811   LABSPEC 1.011 06/06/2018 0811   PHURINE 5.0 06/06/2018 0811   GLUCOSEU NEGATIVE 06/06/2018 0811   HGBUR NEGATIVE 06/06/2018 0811   BILIRUBINUR NEGATIVE 06/06/2018 0811   KETONESUR NEGATIVE 06/06/2018 0811   PROTEINUR NEGATIVE 06/06/2018 0811   UROBILINOGEN 0.2 02/19/2014 0850   NITRITE NEGATIVE 06/06/2018 0811   LEUKOCYTESUR NEGATIVE 06/06/2018 0811   Sepsis Labs Invalid input(s): PROCALCITONIN,  WBC,  LACTICIDVEN Microbiology Recent Results (from the past 240 hour(s))  Urine culture     Status: None   Collection Time: 06/06/18  8:11 AM  Result Value Ref Range Status   Specimen Description   Final    URINE, CLEAN CATCH Performed at [facility], [place] [address], [place], [place] [address]    Special Requests   Final    NONE Performed at [facility], [place] [address], [place], [place] [address]    Culture   Final    NO GROWTH Performed at [facility], [place] [address], [place], [place] [address]    Report Status 06/07/2018 FINAL  Final    Please note: You were cared for by a hospitalist during your hospital stay. Once you are discharged, your primary care physician will handle any  further medical issues. Please note that NO REFILLS for any discharge medications will be authorized once you are discharged, as it is imperative that you return to your primary care physician (or establish a relationship with a primary care physician if you do not have one) for your post hospital discharge needs so that they can reassess your need for medications and monitor your lab values.    Time coordinating discharge: 40 minutes  SIGNED:   Shelly Coss, MD  [facility] 06/07/2018, 12:21 PM Pager [phone]  If 7PM-7AM, please contact night-coverage [email or web address] Password [email or web address]

## 2018-06-10 DIAGNOSIS — Z09 Encounter for follow-up examination after completed treatment for conditions other than malignant neoplasm: Secondary | ICD-10-CM | POA: Diagnosis not present

## 2018-06-10 DIAGNOSIS — E7800 Pure hypercholesterolemia, unspecified: Secondary | ICD-10-CM | POA: Diagnosis not present

## 2018-06-10 DIAGNOSIS — F419 Anxiety disorder, unspecified: Secondary | ICD-10-CM | POA: Diagnosis not present

## 2018-06-10 DIAGNOSIS — Z6828 Body mass index (BMI) 28.0-28.9, adult: Secondary | ICD-10-CM | POA: Diagnosis not present

## 2018-06-10 DIAGNOSIS — K5792 Diverticulitis of intestine, part unspecified, without perforation or abscess without bleeding: Secondary | ICD-10-CM | POA: Diagnosis not present

## 2018-06-10 DIAGNOSIS — Z299 Encounter for prophylactic measures, unspecified: Secondary | ICD-10-CM | POA: Diagnosis not present

## 2018-06-13 ENCOUNTER — Emergency Department (HOSPITAL_COMMUNITY): Payer: Medicare Other

## 2018-06-13 ENCOUNTER — Emergency Department (HOSPITAL_COMMUNITY)
Admission: EM | Admit: 2018-06-13 | Discharge: 2018-06-13 | Disposition: A | Discharge status: Home / Self Care | Payer: Medicare Other | Attending: Emergency Medicine | Admitting: Emergency Medicine

## 2018-06-13 ENCOUNTER — Other Ambulatory Visit: Payer: Self-pay

## 2018-06-13 DIAGNOSIS — Z79899 Other long term (current) drug therapy: Secondary | ICD-10-CM | POA: Diagnosis not present

## 2018-06-13 DIAGNOSIS — K5732 Diverticulitis of large intestine without perforation or abscess without bleeding: Secondary | ICD-10-CM | POA: Diagnosis not present

## 2018-06-13 DIAGNOSIS — K5792 Diverticulitis of intestine, part unspecified, without perforation or abscess without bleeding: Secondary | ICD-10-CM | POA: Insufficient documentation

## 2018-06-13 DIAGNOSIS — R1032 Left lower quadrant pain: Secondary | ICD-10-CM | POA: Diagnosis present

## 2018-06-13 LAB — URINALYSIS, ROUTINE W REFLEX MICROSCOPIC
BACTERIA UA: NONE SEEN
Bilirubin Urine: NEGATIVE
Glucose, UA: NEGATIVE mg/dL
KETONES UR: NEGATIVE mg/dL
LEUKOCYTES UA: NEGATIVE
Nitrite: NEGATIVE
PROTEIN: NEGATIVE mg/dL
Specific Gravity, Urine: 1.005 (ref 1.005–1.030)
pH: 5 (ref 5.0–8.0)

## 2018-06-13 LAB — COMPREHENSIVE METABOLIC PANEL
ALT: 20 U/L (ref 0–44)
AST: 31 U/L (ref 15–41)
Albumin: 3.8 g/dL (ref 3.5–5.0)
Alkaline Phosphatase: 85 U/L (ref 38–126)
Anion gap: 12 (ref 5–15)
BUN: 10 mg/dL (ref 8–23)
CHLORIDE: 107 mmol/L (ref 98–111)
CO2: 21 mmol/L — ABNORMAL LOW (ref 22–32)
CREATININE: 0.83 mg/dL (ref 0.44–1.00)
Calcium: 9.3 mg/dL (ref 8.9–10.3)
GFR calc non Af Amer: >60 mL/min (ref >60)
Glucose, Bld: 95 mg/dL (ref 70–99)
POTASSIUM: 3.2 mmol/L — AB (ref 3.5–5.1)
Sodium: 140 mmol/L (ref 135–145)
Total Bilirubin: 0.5 mg/dL (ref 0.3–1.2)
Total Protein: 7 g/dL (ref 6.5–8.1)

## 2018-06-13 LAB — CBC
HEMATOCRIT: 43.2 % (ref 36.0–46.0)
Hemoglobin: 13.5 g/dL (ref 12.0–15.0)
MCH: 31.4 pg (ref 26.0–34.0)
MCHC: 31.3 g/dL (ref 30.0–36.0)
MCV: 100.5 fL — AB (ref 78.0–100.0)
Platelets: 343 10*3/uL (ref 150–400)
RBC: 4.3 MIL/uL (ref 3.87–5.11)
RDW: 12.4 % (ref 11.5–15.5)
WBC: 7.4 10*3/uL (ref 4.0–10.5)

## 2018-06-13 LAB — LIPASE, BLOOD: LIPASE: 46 U/L (ref 11–51)

## 2018-06-13 MED ORDER — HYDROCODONE-ACETAMINOPHEN 5-325 MG PO TABS: 1.0000 | ORAL_TABLET | Freq: Four times a day (QID) | ORAL | 0 refills | Status: DC | PRN

## 2018-06-13 MED ORDER — FENTANYL CITRATE (PF) 100 MCG/2ML IJ SOLN
50.0000 ug | Freq: Once | INTRAMUSCULAR | Status: AC
  Administered 2018-06-13: 50 ug via INTRAVENOUS
  Filled 2018-06-13: qty 2

## 2018-06-13 NOTE — ED Notes (Signed)
D/c reviewed with patient  And family

## 2018-06-13 NOTE — ED Triage Notes (Signed)
Patient c/o LLQ abdominal pain. States was seen at [facility] on Monday and dx with diverticulitis. States that the pain is not getting better.

## 2018-06-13 NOTE — ED Notes (Signed)
C/o lower abd. Pain onset yest afternoon, states she has been taking Augmentin for 1 week for diverticulosis, however states pain increased yest. C/o diarrhea however no blood

## 2018-06-13 NOTE — ED Provider Notes (Signed)
[place] EMERGENCY DEPARTMENT Provider Note   CSN: [id number] Arrival date & time: 06/13/18  0257     History   Chief Complaint Chief Complaint  Patient presents with  . Abdominal Pain    HPI Alexa Wilson is a 72 y.o. female.  The history is provided by the patient.  Abdominal Pain   This is a new problem. The current episode started more than 2 days ago. The problem occurs daily. The problem has been gradually worsening. The pain is located in the LLQ. The pain is severe. Associated symptoms include nausea. Pertinent negatives include fever, vomiting and dysuria. The symptoms are aggravated by palpation. Nothing relieves the symptoms.   Patient presents for recurrent pain from diverticulitis. Patient recently admitted to [facility] long hospital August 5 for diverticulitis.  She reports her pain never really went away but she was discharged by the hospitalist she reports that she was discharged too early  she reports her pain has continued at home, but she has been able to take her antibiotics. No fever or vomiting.  She reports pain is mostly located in left lower quadrant.  She also reports some back pain.  No urinary symptoms reported. Past Medical History:  Diagnosis Date  . Agoraphobia with panic disorder 1982  . Brown recluse spider bite   . Constipation   . Diverticulitis   . GERD (gastroesophageal reflux disease)   . Hyperlipidemia   . Lyme disease   . Migraines    "don't have them since menopause" (09/23/2016)  . Panic disorder   . Shingles     Patient Active Problem List   Diagnosis Date Noted  . Diverticulitis 09/22/2016  . Acute hypokalemia 09/22/2016  . Hyperlipidemia 11/15/2015  . Constipation 04/09/2015  . Palpitations 08/11/2013  . Chest pressure 07/15/2013  . Right arm weakness 07/15/2013  . Sigmoid diverticulitis 07/14/2012  . DYSPEPSIA 09/06/2009  . IBS 09/06/2009  . COLONIC POLYPS, HX OF 09/06/2009  . WEIGHT LOSS  06/07/2009  . NAUSEA AND VOMITING 06/07/2009  . Abdominal pain 06/07/2009  . EPIGASTRIC PAIN 06/07/2009  . COLONIC POLYPS 02/21/2009  . DYSPHAGIA 02/21/2009  . PANIC DISORDER 02/20/2009  . CATARACTS 02/20/2009  . DIVERTICULOSIS OF COLON 02/20/2009  . HELICOBACTER PYLORI GASTRITIS, HX OF 02/20/2009    Past Surgical History:  Procedure Laterality Date  . APPENDECTOMY  1964  . CHOLECYSTECTOMY OPEN  1966  . COLONOSCOPY  2012   RMR: 1. External hemorrhoids, otherwise normal rectum. 2. Left-sided diverticula status post snare polypectomy left colon polyp. Remainder of the colonic mucosa and terminal ileum mucoa appeared normal.   . COLONOSCOPY N/A 02/11/2017   Procedure: COLONOSCOPY;  Surgeon: Daneil Dolin, MD;  Location: [facility] SUITE;  Service: Endoscopy;  Laterality: N/A;  1200  . DILATION AND CURETTAGE OF UTERUS    . TONSILLECTOMY  1961     OB History   None      Home Medications    Prior to Admission medications   Medication Sig Start Date End Date Taking? Authorizing Provider  alprazolam Duanne Moron) 2 MG tablet Take 1-2 mg by mouth every 6 (six) hours.    Yes [provider]  amoxicillin-clavulanate (AUGMENTIN) 875-125 MG tablet Take 1 tablet by mouth 3 (three) times daily for 7 days. 06/07/18 06/14/18 Yes Shelly Coss, MD  Cholecalciferol (VITAMIN D3) 2000 units TABS Take 2,000 Units by mouth daily.    Yes [provider]  docusate sodium (STOOL SOFTENER) 100 MG capsule Take 1  capsule (100 mg total) by mouth 2 (two) times daily. 06/07/18  Yes Shelly Coss, MD  metoprolol succinate (TOPROL-XL) 25 MG 24 hr tablet Take 0.5 tablets (12.5 mg total) by mouth daily. NEED OV. 02/24/18  Yes Crenshaw, Denice Bors, MD  dicyclomine (BENTYL) 10 MG capsule Take 1 capsule (10 mg total) 3 (three) times daily as needed by mouth (for soft/loose stools). Patient not taking: Reported on 06/06/2018 09/15/17   Carlis Stable, NP  polyethylene glycol-electrolytes (TRILYTE) 420 g solution  Take 4,000 mLs by mouth as directed. Patient not taking: Reported on 06/06/2018 01/06/17   Rourk, Cristopher Estimable, MD    Family History Family History  Problem Relation Age of Onset  . Diabetes Mother   . Hypertension Mother   . Heart attack Mother   . Cancer Father        Melanoma   . Diabetes Brother   . Diabetes Son   . Colon cancer Neg Hx     Social History Social History   Tobacco Use  . Smoking status: Never Smoker  . Smokeless tobacco: Never Used  Substance Use Topics  . Alcohol use: No    Alcohol/week: 0.0 standard drinks  . Drug use: No     Allergies   Ciprofloxacin; Codeine; Metronidazole; Morphine and related; and Other   Review of Systems Review of Systems  Constitutional: Negative for fever.  Cardiovascular: Negative for chest pain.  Gastrointestinal: Positive for abdominal pain and nausea. Negative for vomiting.  Genitourinary: Negative for dysuria.  Musculoskeletal: Positive for back pain.  All other systems reviewed and are negative.    Physical Exam Updated Vital Signs BP (!) 160/80   Pulse (!) 59   Temp 97.8 F (36.6 C) (Oral)   Resp 17   Ht 1.676 m (5\' 6" )   Wt 78.9 kg   SpO2 100%   BMI 28.08 kg/m   Physical Exam  CONSTITUTIONAL: Elderly, no acute distress, flat affect HEAD: Normocephalic/atraumatic EYES: EOMI/PERRL ENMT: Mucous membranes moist NECK: supple no meningeal signs SPINE/BACK:entire spine nontender CV: S1/S2 noted, no murmurs/rubs/gallops noted LUNGS: Lungs are clear to auscultation bilaterally, no apparent distress ABDOMEN: soft, moderate left lower quadrant tenderness, no rebound or guarding, bowel sounds noted throughout abdomen GU:no cva tenderness NEURO: Pt is awake/alert/appropriate, moves all extremitiesx4.  No facial droop.   EXTREMITIES: pulses normal/equal, full ROM SKIN: warm, color normal PSYCH: Flat affect  ED Treatments / Results  Labs (all labs ordered are listed, but only abnormal results are  displayed) Labs Reviewed  COMPREHENSIVE METABOLIC PANEL - Abnormal; Notable for the following components:      Result Value   Potassium 3.2 (*)    CO2 21 (*)    All other components within normal limits  CBC - Abnormal; Notable for the following components:   MCV 100.5 (*)    All other components within normal limits  URINALYSIS, ROUTINE W REFLEX MICROSCOPIC - Abnormal; Notable for the following components:   Color, Urine STRAW (*)    Hgb urine dipstick SMALL (*)    All other components within normal limits  LIPASE, BLOOD    EKG None  Radiology Ct Abdomen Pelvis Wo Contrast  Result Date: 06/13/2018 CLINICAL DATA:  Left lower quadrant abdominal pain. Diagnosed with diverticulitis on Monday with no improvement. EXAM: CT ABDOMEN AND PELVIS WITHOUT CONTRAST TECHNIQUE: Multidetector CT imaging of the abdomen and pelvis was performed following the standard protocol without IV contrast. COMPARISON:  06/06/2018 FINDINGS: Lower chest: Linear scarring in the lung  bases. Small esophageal hiatal hernia. Hepatobiliary: No focal liver abnormality is seen. No gallstones, gallbladder wall thickening, or biliary dilatation. Pancreas: Unremarkable. No pancreatic ductal dilatation or surrounding inflammatory changes. Spleen: Normal in size without focal abnormality. Adrenals/Urinary Tract: Adrenal glands are unremarkable. Kidneys are normal, without renal calculi, focal lesion, or hydronephrosis. Bladder is unremarkable. Stomach/Bowel: Stomach, small bowel, and colon are not abnormally distended. Sigmoid colon diverticula with infiltration in the pericolonic fat suggesting early diverticulitis. Mild improvement since previous study. No developing abscess. Appendix is surgically absent. Vascular/Lymphatic: Aortic atherosclerosis. No enlarged abdominal or pelvic lymph nodes. Reproductive: Uterus and bilateral adnexa are unremarkable. Other: No abdominal wall hernia or abnormality. No abdominopelvic ascites.  Musculoskeletal: No acute or significant osseous findings. IMPRESSION: Diverticulitis involving the sigmoid colon with improvement since previous study. No developing abscess. Electronically Signed   By: Lucienne Capers M.D.   On: 06/13/2018 05:36    Procedures Procedures   Medications Ordered in ED Medications  fentaNYL (SUBLIMAZE) injection 50 mcg (50 mcg Intravenous Given 06/13/18 0622)     Initial Impression / Assessment and Plan / ED Course  I have reviewed the triage vital signs and the nursing notes.  Pertinent labs  results that were available during my care of the patient were reviewed by me and considered in my medical decision making (see chart for details). Narcotic database reviewed and considered in decision making    7:01 AM Patient presents for recurrent abdominal pain.  She was first seen August this for this pain and was admitted to the hospital for diverticulitis.  She reports the pain never really went away but she did continue her antibiotics.  She has had multiple CT scans before, but due to escalating pain and her age CT scan was performed.  Her diverticulitis is improving, no signs of perforation or abscess.  We will trial p.o. challenge  8:01 AM Patient improved, taking p.o. fluids.  She is able to continue her oral antibiotics.  Since her CT is improving, labs reassuring taking p.o. I feel she is appropriate for discharge home.  I have encouraged her to trial oral narcotics as needed.  She has tolerated vicodin before.  Due to her multiple episodes of diverticulitis follow-up with general surgery is warranted.  She would like to see Dr. Johney Maine.  Patient and son is at bedside both agree with plan.  They feel comfortable with discharge home. Final Clinical Impressions(s) / ED Diagnoses   Final diagnoses:  Diverticulitis    ED Discharge Orders         Ordered    HYDROcodone-acetaminophen (NORCO/VICODIN) 5-325 MG tablet  Every 6 hours PRN     06/13/18 0801            Ripley Fraise, MD 06/13/18 [phone]

## 2018-06-28 ENCOUNTER — Telehealth: Payer: Self-pay | Admitting: Cardiology

## 2018-06-28 NOTE — Telephone Encounter (Signed)
Spoke wit pt.  Pt sts that she is very anxious and her heartrate this morning was 78-80 bpm. Pt is asymptomatic. She had not taken her Metoprolol for 2 says and resumed it today. She denies palpitations, sob, chest pain.  She takes Metoprolol Succinate 12.5mg  daily. She reports that she has  panic attacks and anxiety and has a prescription for Xanax. She has taken Xanax 1mg  this morning and things for her has seem to be calming down.  Adv her that she should f/u with her pcp for anxiety. (pt was quite anxious on the phone, a lot of questions asked and answered)Her rate is with in normal range, adv her of the normal heartrate range of 60-100 bpm.  Pt has an appt to see Dr.Crenshaw in Oct 2019. She will f/u with him as planned. She will contact our office if cardiac symptoms develop. Pt agreeable with plan and verbalized understanding.

## 2018-06-28 NOTE — Telephone Encounter (Signed)
° °  Patient wants to know if she should take another metoprolol succinate (TOPROL-XL) 25 MG 24 hr tablet  1) What is your heart rate?78-80  2) Do you have a log of your heart rate readings (document readings)? 78-80  3) Do you have any other symptoms? Nervous, anxiety

## 2018-07-01 DIAGNOSIS — I7300 Raynaud's syndrome without gangrene: Secondary | ICD-10-CM | POA: Diagnosis not present

## 2018-07-01 DIAGNOSIS — M1990 Unspecified osteoarthritis, unspecified site: Secondary | ICD-10-CM | POA: Diagnosis not present

## 2018-07-01 DIAGNOSIS — H9202 Otalgia, left ear: Secondary | ICD-10-CM | POA: Diagnosis not present

## 2018-07-01 DIAGNOSIS — Z299 Encounter for prophylactic measures, unspecified: Secondary | ICD-10-CM | POA: Diagnosis not present

## 2018-07-01 DIAGNOSIS — Z6828 Body mass index (BMI) 28.0-28.9, adult: Secondary | ICD-10-CM | POA: Diagnosis not present

## 2018-08-15 NOTE — Progress Notes (Signed)
HPI: FU palpitations and chest pain. Echocardiogram in September of 2014 showed normal LV function. There was grade 1 diastolic dysfunction. Trace mitral regurgitation. Patient has had intermittent palpitations for years. Exercise treadmill in November of 2014 showed poor exercise tolerance. There was no chest pain, no ECG changes and no arrhythmias. Monitor in November of 2014 showed sinus rhythm with PACs and PVCs. Since I last saw her, the patient denies any dyspnea on exertion, orthopnea, PND, pedal edema, palpitations, syncope or chest pain.   Current Outpatient Medications  Medication Sig Dispense Refill  . alprazolam (XANAX) 2 MG tablet Take 1-2 mg by mouth every 6 (six) hours.     . Cholecalciferol (VITAMIN D3) 2000 units TABS Take 2,000 Units by mouth daily.     Marland Kitchen docusate sodium (STOOL SOFTENER) 100 MG capsule Take 1 capsule (100 mg total) by mouth 2 (two) times daily. 60 capsule 0  . metoprolol succinate (TOPROL-XL) 25 MG 24 hr tablet Take 0.5 tablets (12.5 mg total) by mouth daily. NEED OV. 15 tablet 0   No current facility-administered medications for this visit.      Past Medical History:  Diagnosis Date  . Agoraphobia with panic disorder 1982  . Brown recluse spider bite   . Constipation   . Diverticulitis   . GERD (gastroesophageal reflux disease)   . Hyperlipidemia   . Lyme disease   . Migraines    "don't have them since menopause" (09/23/2016)  . Panic disorder   . Shingles     Past Surgical History:  Procedure Laterality Date  . APPENDECTOMY  1964  . CHOLECYSTECTOMY OPEN  1966  . COLONOSCOPY  2012   RMR: 1. External hemorrhoids, otherwise normal rectum. 2. Left-sided diverticula status post snare polypectomy left colon polyp. Remainder of the colonic mucosa and terminal ileum mucoa appeared normal.   . COLONOSCOPY N/A 02/11/2017   Procedure: COLONOSCOPY;  Surgeon: Daneil Dolin, MD;  Location: [facility];  Service: Endoscopy;  Laterality: N/A;  1200   . DILATION AND CURETTAGE OF UTERUS    . TONSILLECTOMY  1961    Social History   Socioeconomic History  . Marital status: Widowed    Spouse name: Not on file  . Number of children: 5  . Years of education: Not on file  . Highest education level: Not on file  Occupational History  . Not on file  Social Needs  . Financial resource strain: Not on file  . Food insecurity:    Worry: Not on file    Inability: Not on file  . Transportation needs:    Medical: Not on file    Non-medical: Not on file  Tobacco Use  . Smoking status: Never Smoker  . Smokeless tobacco: Never Used  Substance and Sexual Activity  . Alcohol use: No    Alcohol/week: 0.0 standard drinks  . Drug use: No  . Sexual activity: Never  Lifestyle  . Physical activity:    Days per week: Not on file    Minutes per session: Not on file  . Stress: Not on file  Relationships  . Social connections:    Talks on phone: Not on file    Gets together: Not on file    Attends religious service: Not on file    Active member of club or organization: Not on file    Attends meetings of clubs or organizations: Not on file    Relationship status: Not on file  . Intimate  partner violence:    Fear of current or ex partner: Not on file    Emotionally abused: Not on file    Physically abused: Not on file    Forced sexual activity: Not on file  Other Topics Concern  . Not on file  Social History Narrative  . Not on file    Family History  Problem Relation Age of Onset  . Diabetes Mother   . Hypertension Mother   . Heart attack Mother   . Cancer Father        Melanoma   . Diabetes Brother   . Diabetes Son   . Colon cancer Neg Hx     ROS: no fevers or chills, productive cough, hemoptysis, dysphasia, odynophagia, melena, hematochezia, dysuria, hematuria, rash, seizure activity, orthopnea, PND, pedal edema, claudication. Remaining systems are negative.  Physical Exam: Well-developed well-nourished in no acute  distress.  Skin is warm and dry.  HEENT is normal.  Neck is supple.  Chest is clear to auscultation with normal expansion.  Cardiovascular exam is regular rate and rhythm.  Abdominal exam nontender or distended. No masses palpated. Extremities show no edema. neuro grossly intact  ECG-normal sinus rhythm at a rate of 67.  No ST changes.  Personally reviewed  A/P  1 palpitations-patient's symptoms are controlled with Toprol.  Will continue.  2 hyperlipidemia-Managed by primary care.  3 Chest pain-no further symptoms.  Previous treadmill negative.  No further ischemia evaluation.  Kirk Ruths, MD

## 2018-08-23 ENCOUNTER — Ambulatory Visit (INDEPENDENT_AMBULATORY_CARE_PROVIDER_SITE_OTHER): Payer: Medicare Other | Admitting: Cardiology

## 2018-08-23 ENCOUNTER — Encounter: Payer: Self-pay | Admitting: Cardiology

## 2018-08-23 VITALS — BP 127/67 | HR 65 | Ht 66.0 in | Wt 176.0 lb

## 2018-08-23 DIAGNOSIS — R002 Palpitations: Secondary | ICD-10-CM

## 2018-08-23 DIAGNOSIS — E785 Hyperlipidemia, unspecified: Secondary | ICD-10-CM | POA: Diagnosis not present

## 2018-08-23 MED ORDER — METOPROLOL SUCCINATE ER 25 MG PO TB24
12.5000 mg | ORAL_TABLET | Freq: Every day | ORAL | 3 refills | Status: DC
Start: 2018-08-23 — End: 2019-07-06

## 2018-08-23 NOTE — Patient Instructions (Signed)
Medication Instructions:  Refill sent to the pharmacy electronically.  If you need a refill on your cardiac medications before your next appointment, please call your pharmacy.   Lab work: If you have labs (blood work) drawn today and your tests are completely normal, you will receive your results only by: . MyChart Message (if you have MyChart) OR . A paper copy in the mail If you have any lab test that is abnormal or we need to change your treatment, we will call you to review the results.  Follow-Up: At [facility], you and your health needs are our priority.  As part of our continuing mission to provide you with exceptional heart care, we have created designated Provider Care Teams.  These Care Teams include your primary Cardiologist (physician) and Advanced Practice Providers (APPs -  Physician Assistants and Nurse Practitioners) who all work together to provide you with the care you need, when you need it. You will need a follow up appointment in 12 months.  Please call our office 2 months in advance to schedule this appointment.  You may see BRIAN CRENSHAW MD or one of the following Advanced Practice Providers on your designated Care Team:   Luke Kilroy, PA-C Krista Kroeger, PA-C . Callie Goodrich, PA-C     

## 2018-08-29 DIAGNOSIS — Z7189 Other specified counseling: Secondary | ICD-10-CM | POA: Diagnosis not present

## 2018-08-29 DIAGNOSIS — Z0000 Encounter for general adult medical examination without abnormal findings: Secondary | ICD-10-CM | POA: Diagnosis not present

## 2018-08-29 DIAGNOSIS — R5383 Other fatigue: Secondary | ICD-10-CM | POA: Diagnosis not present

## 2018-08-29 DIAGNOSIS — E7800 Pure hypercholesterolemia, unspecified: Secondary | ICD-10-CM | POA: Diagnosis not present

## 2018-08-29 DIAGNOSIS — Z6827 Body mass index (BMI) 27.0-27.9, adult: Secondary | ICD-10-CM | POA: Diagnosis not present

## 2018-08-29 DIAGNOSIS — Z1331 Encounter for screening for depression: Secondary | ICD-10-CM | POA: Diagnosis not present

## 2018-08-29 DIAGNOSIS — Z1339 Encounter for screening examination for other mental health and behavioral disorders: Secondary | ICD-10-CM | POA: Diagnosis not present

## 2018-08-29 DIAGNOSIS — Z299 Encounter for prophylactic measures, unspecified: Secondary | ICD-10-CM | POA: Diagnosis not present

## 2018-08-29 DIAGNOSIS — Z1211 Encounter for screening for malignant neoplasm of colon: Secondary | ICD-10-CM | POA: Diagnosis not present

## 2018-08-29 DIAGNOSIS — Z79899 Other long term (current) drug therapy: Secondary | ICD-10-CM | POA: Diagnosis not present

## 2018-08-31 DIAGNOSIS — Z01419 Encounter for gynecological examination (general) (routine) without abnormal findings: Secondary | ICD-10-CM | POA: Diagnosis not present

## 2018-08-31 DIAGNOSIS — Z6828 Body mass index (BMI) 28.0-28.9, adult: Secondary | ICD-10-CM | POA: Diagnosis not present

## 2018-09-18 IMAGING — CT CT ABD-PELV W/ CM
2 of 5 series · 15 of 46 positions shown, 17 images · IV contrast (Omni 300)
Comparison: 06/27/2015; radiographs from 08/06/2016

CLINICAL DATA: Abdominal pain for 1 week.

EXAM:
CT ABDOMEN AND PELVIS WITH CONTRAST
TECHNIQUE: Multidetector CT imaging of the abdomen and pelvis was performed
using the standard protocol following bolus administration of
intravenous contrast.
CONTRAST:  100mL 5F5NTV-WBB IOPAMIDOL (5F5NTV-WBB) INJECTION 61%

[Series 3: a/p w/ 5mm · axial · 0.78mm/px · z∈[+952,+1397]mm · 12 of 101 slices shown, 14 images]
[im 6/101  soft-tissue]
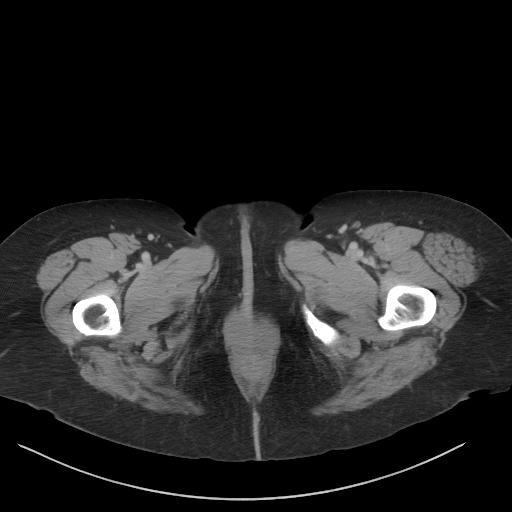
[im 6/101  bone]
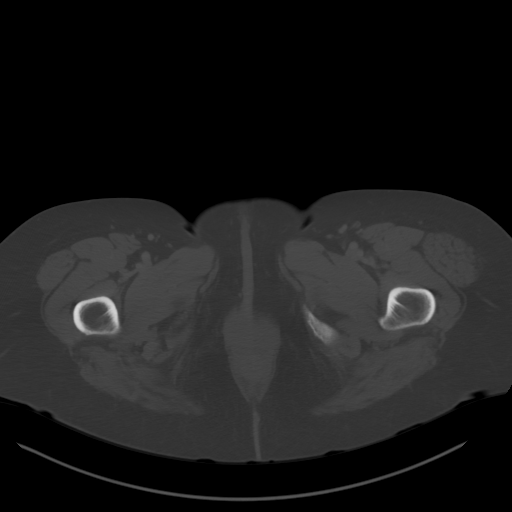
[im 16/101  soft-tissue]
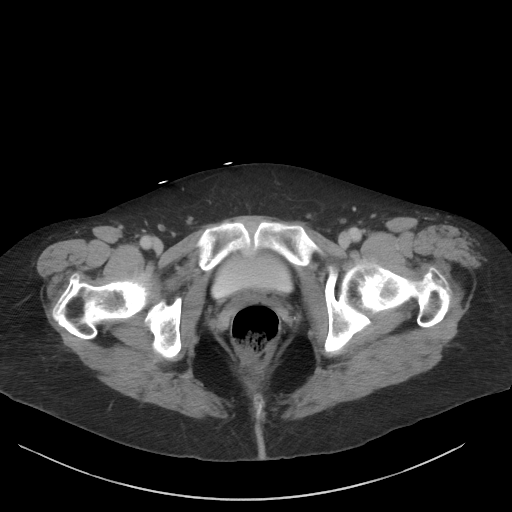
[im 22/101  soft-tissue]
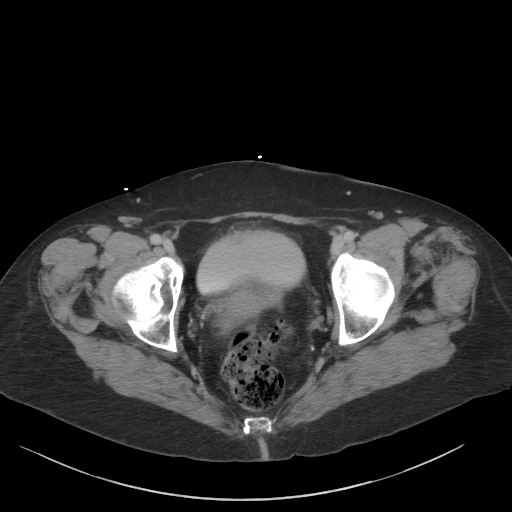
[im 32/101  soft-tissue]
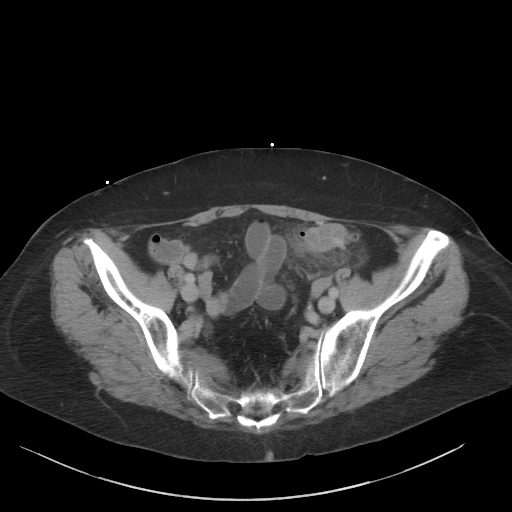
[im 37/101  soft-tissue]
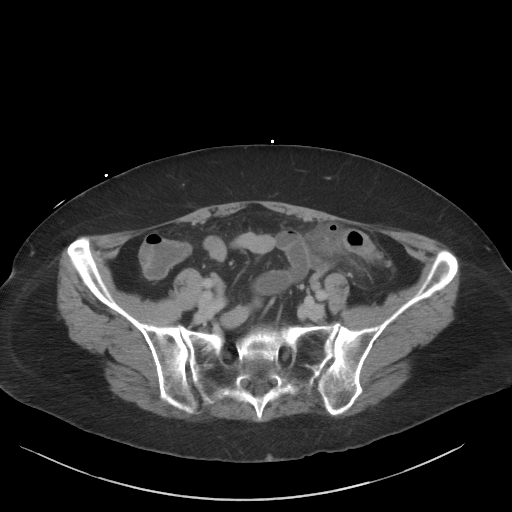
[im 48/101  soft-tissue]
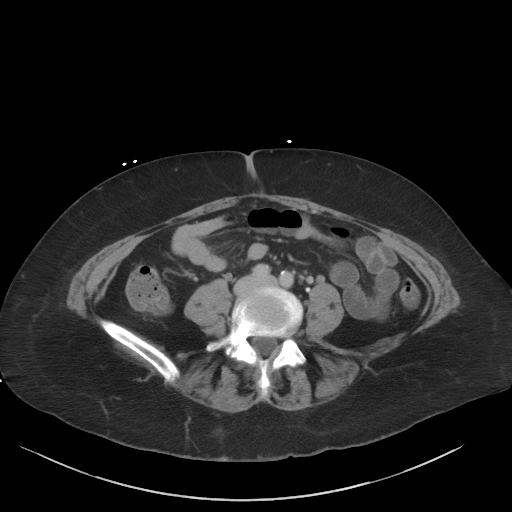
[im 53/101  soft-tissue]
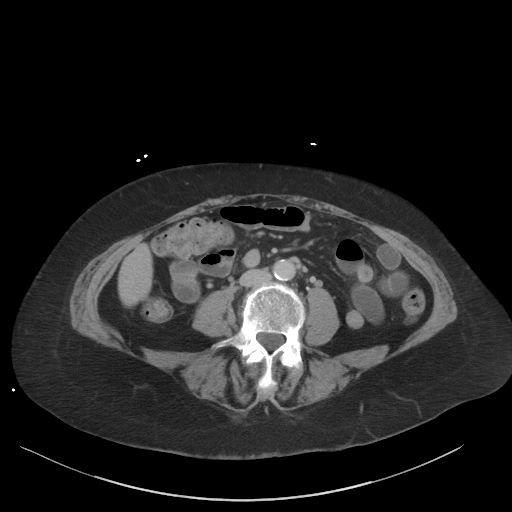
[im 64/101  soft-tissue]
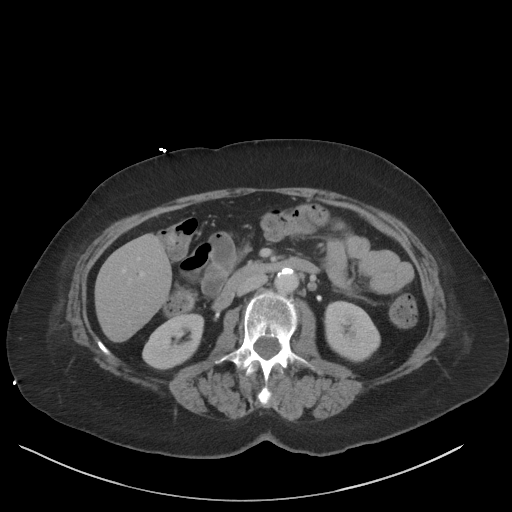
[im 69/101  soft-tissue]
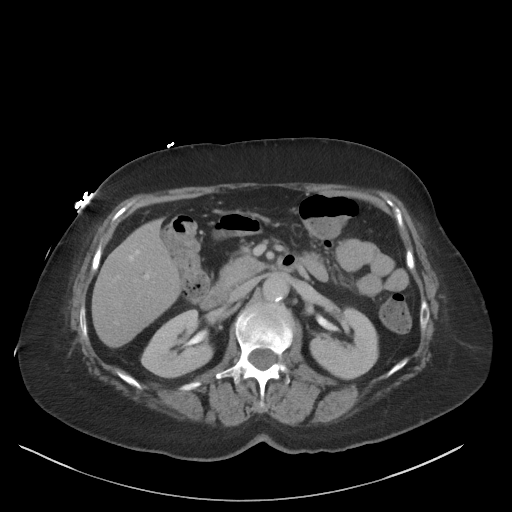
[im 69/101  bone]
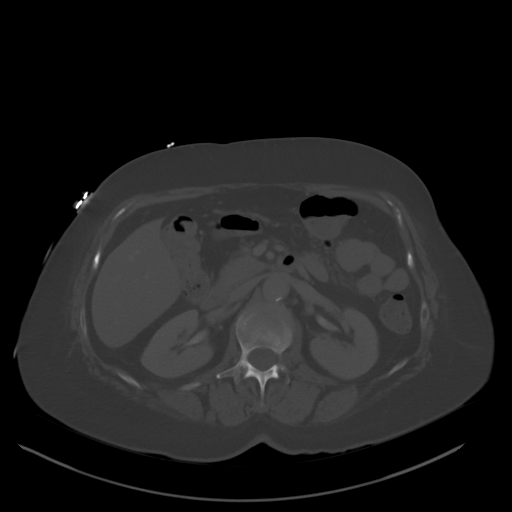
[im 79/101  soft-tissue]
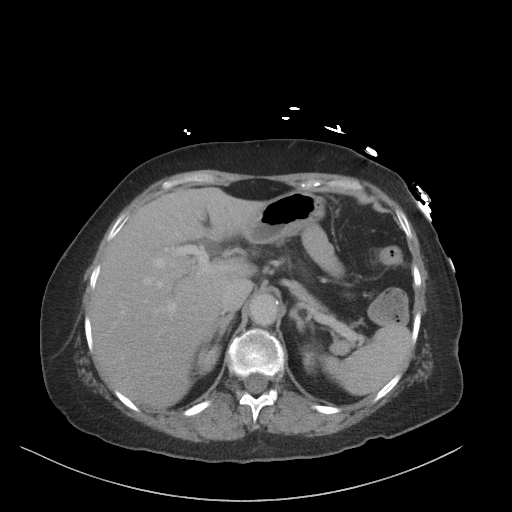
[im 85/101  soft-tissue]
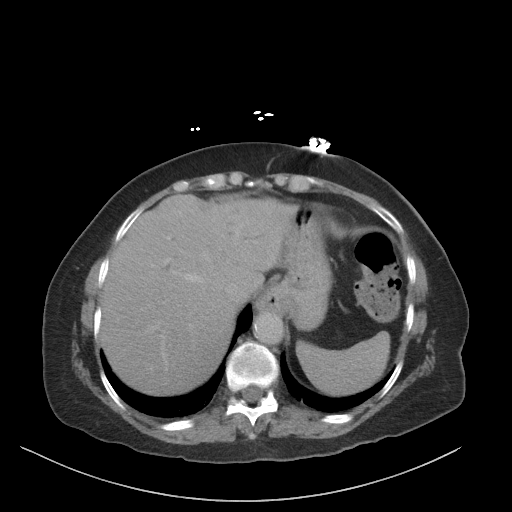
[im 95/101  soft-tissue]
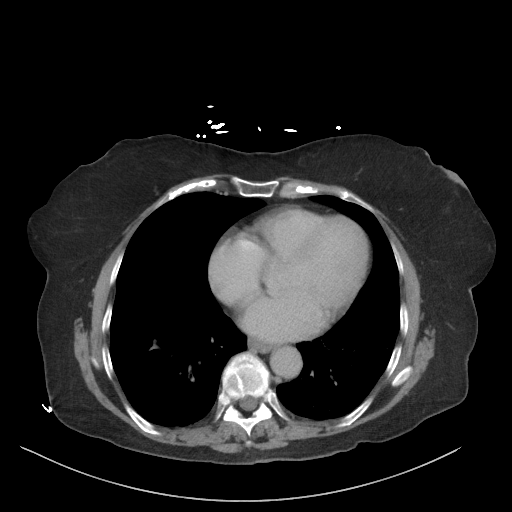

[Series 6: a/p w/ cor · coronal · 0.68mm/px · 3 of 118 slices shown]
[im 40/118  soft-tissue]
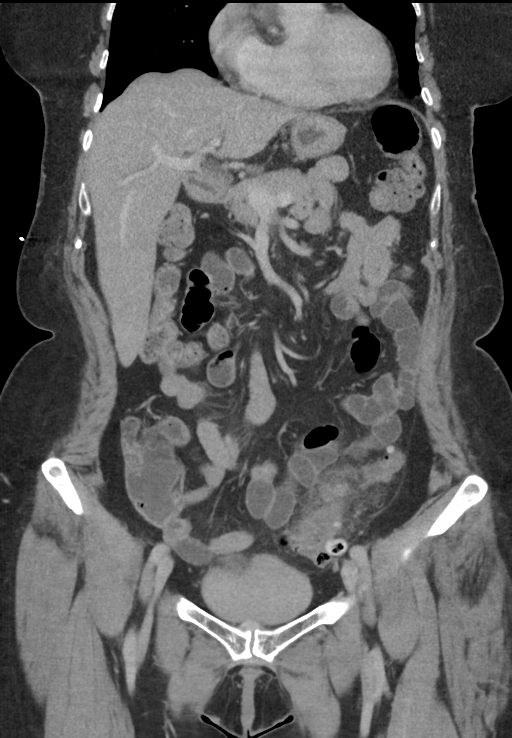
[im 53/118  soft-tissue]
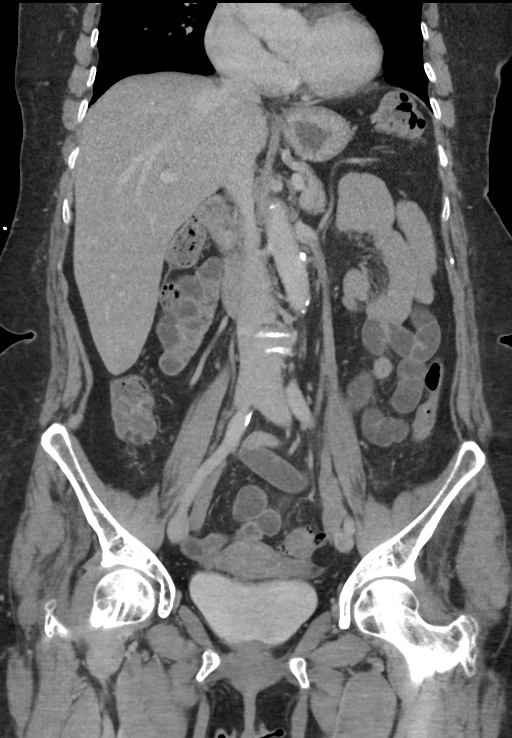
[im 66/118  soft-tissue]
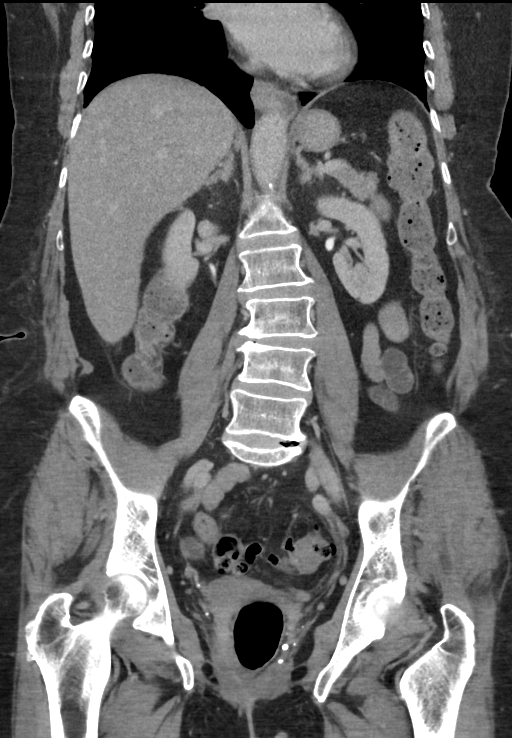

[15 of 46 positions shown; findings below may reference images not displayed]

FINDINGS: Lower chest: Subsegmental atelectasis or scar in the left lower
lobe. Small type 1 hiatal hernia.

Hepatobiliary: Minimal intrahepatic biliary dilatation. Common bile
duct about 6 mm in diameter. Gallbladder not seen, presumed
surgically absent. I do not see a mass along the porta hepatis, the
liver appears otherwise unremarkable.

Pancreas: Unremarkable

Spleen: Unremarkable

Adrenals/Urinary Tract: Mixed injection with portal venous phase
contrast in also excreted contrast in the renal collecting systems.
Contrast medium seen in the urinary bladder. There is no filling
defect along the urothelium. Adrenal glands normal.

Stomach/Bowel: Moderate to prominent sigmoid diverticulitis
involving the proximal sigmoid colon, with multiple inflamed
diverticula in the region of involvement. I do not see any definite
extraluminal gas or discrete abscess, several enhancing diverticula
and one gas filled diverticulum in the region of involvement.

Vascular/Lymphatic: Aortoiliac atherosclerotic vascular disease. No
pathologic adenopathy identified.

Reproductive: Unremarkable

Other: No supplemental non-categorized findings.

Musculoskeletal: Levoconvex lumbar scoliosis. Suspected left
foraminal stenosis due to a left lateral recess and foraminal disc
protrusion at L5-S1.
IMPRESSION: 1. Moderate to prominent proximal sigmoid colon diverticulitis with
considerable inflammatory stranding in the region, but no
extraluminal gas or abscess.
2. Left foraminal impingement at L5-S1 due to a disc protrusion.
Levoconvex lumbar scoliosis.
3. Minimal intrahepatic biliary dilatation, probably incidental.
4.  Aortoiliac atherosclerotic vascular disease.
5. Small type 1 hiatal hernia.

## 2018-10-03 DIAGNOSIS — Z299 Encounter for prophylactic measures, unspecified: Secondary | ICD-10-CM | POA: Diagnosis not present

## 2018-10-03 DIAGNOSIS — Z6828 Body mass index (BMI) 28.0-28.9, adult: Secondary | ICD-10-CM | POA: Diagnosis not present

## 2018-10-03 DIAGNOSIS — K5732 Diverticulitis of large intestine without perforation or abscess without bleeding: Secondary | ICD-10-CM | POA: Diagnosis not present

## 2018-10-14 DIAGNOSIS — E039 Hypothyroidism, unspecified: Secondary | ICD-10-CM | POA: Diagnosis not present

## 2018-10-14 DIAGNOSIS — I959 Hypotension, unspecified: Secondary | ICD-10-CM | POA: Diagnosis not present

## 2018-10-14 DIAGNOSIS — J9602 Acute respiratory failure with hypercapnia: Secondary | ICD-10-CM | POA: Diagnosis not present

## 2018-10-14 DIAGNOSIS — R404 Transient alteration of awareness: Secondary | ICD-10-CM | POA: Diagnosis not present

## 2018-10-14 DIAGNOSIS — E876 Hypokalemia: Secondary | ICD-10-CM | POA: Diagnosis not present

## 2018-10-14 DIAGNOSIS — R4182 Altered mental status, unspecified: Secondary | ICD-10-CM | POA: Diagnosis not present

## 2018-10-14 DIAGNOSIS — F419 Anxiety disorder, unspecified: Secondary | ICD-10-CM | POA: Diagnosis not present

## 2018-10-14 DIAGNOSIS — R0902 Hypoxemia: Secondary | ICD-10-CM | POA: Diagnosis not present

## 2018-10-14 DIAGNOSIS — T424X1A Poisoning by benzodiazepines, accidental (unintentional), initial encounter: Secondary | ICD-10-CM | POA: Diagnosis not present

## 2018-10-15 DIAGNOSIS — T424X1A Poisoning by benzodiazepines, accidental (unintentional), initial encounter: Secondary | ICD-10-CM | POA: Diagnosis present

## 2018-10-15 DIAGNOSIS — J9602 Acute respiratory failure with hypercapnia: Secondary | ICD-10-CM | POA: Diagnosis present

## 2018-10-15 DIAGNOSIS — R51 Headache: Secondary | ICD-10-CM | POA: Diagnosis present

## 2018-10-15 DIAGNOSIS — E039 Hypothyroidism, unspecified: Secondary | ICD-10-CM | POA: Diagnosis present

## 2018-10-15 DIAGNOSIS — Z2821 Immunization not carried out because of patient refusal: Secondary | ICD-10-CM | POA: Diagnosis not present

## 2018-10-15 DIAGNOSIS — Z79899 Other long term (current) drug therapy: Secondary | ICD-10-CM | POA: Diagnosis not present

## 2018-10-15 DIAGNOSIS — R4182 Altered mental status, unspecified: Secondary | ICD-10-CM | POA: Diagnosis not present

## 2018-10-15 DIAGNOSIS — F419 Anxiety disorder, unspecified: Secondary | ICD-10-CM | POA: Diagnosis present

## 2018-10-15 DIAGNOSIS — E876 Hypokalemia: Secondary | ICD-10-CM | POA: Diagnosis present

## 2018-10-15 DIAGNOSIS — E7800 Pure hypercholesterolemia, unspecified: Secondary | ICD-10-CM | POA: Diagnosis present

## 2018-10-19 ENCOUNTER — Telehealth: Payer: Self-pay | Admitting: Cardiology

## 2018-10-19 NOTE — Telephone Encounter (Signed)
Spoke to patient.She was very anxious.She stated she passed out this past Friday 10/14/18.EMS was called and she was taken to [facility] hospital.Stated she was told she took a over dose of Xanax.Stated she received Narcan and she has been foggy headed ever since.Stated she thinks she might have took a extra 1/2 of Metoprolol 25 mg tablet today but she cannot remember.Patient was reassured.Advised to be careful.Advised to follow up with PCP.

## 2018-10-19 NOTE — Telephone Encounter (Signed)
New message   Pt c/o medication issue:  1. Name of Medication:metoprolol succinate (TOPROL-XL) 25 MG 24 hr tablet  2. How are you currently taking this medication (dosage and times per day)? Take 0.5 tablets (12.5 mg total) by mouth daily  3. Are you having a reaction (difficulty breathing--STAT)? No   4. What is your medication issue? Patient states that she took too much medicine in error. Please advise.

## 2018-10-21 DIAGNOSIS — Z299 Encounter for prophylactic measures, unspecified: Secondary | ICD-10-CM | POA: Diagnosis not present

## 2018-10-21 DIAGNOSIS — R55 Syncope and collapse: Secondary | ICD-10-CM | POA: Diagnosis not present

## 2018-10-21 DIAGNOSIS — R21 Rash and other nonspecific skin eruption: Secondary | ICD-10-CM | POA: Diagnosis not present

## 2018-10-28 DIAGNOSIS — M4184 Other forms of scoliosis, thoracic region: Secondary | ICD-10-CM | POA: Diagnosis not present

## 2018-10-28 DIAGNOSIS — M546 Pain in thoracic spine: Secondary | ICD-10-CM | POA: Diagnosis not present

## 2018-10-28 DIAGNOSIS — I700 Atherosclerosis of aorta: Secondary | ICD-10-CM | POA: Diagnosis not present

## 2018-10-28 DIAGNOSIS — M47814 Spondylosis without myelopathy or radiculopathy, thoracic region: Secondary | ICD-10-CM | POA: Diagnosis not present

## 2018-10-28 DIAGNOSIS — Z6827 Body mass index (BMI) 27.0-27.9, adult: Secondary | ICD-10-CM | POA: Diagnosis not present

## 2018-10-28 DIAGNOSIS — M419 Scoliosis, unspecified: Secondary | ICD-10-CM | POA: Diagnosis not present

## 2018-10-28 DIAGNOSIS — I10 Essential (primary) hypertension: Secondary | ICD-10-CM | POA: Diagnosis not present

## 2018-10-28 DIAGNOSIS — Z299 Encounter for prophylactic measures, unspecified: Secondary | ICD-10-CM | POA: Diagnosis not present

## 2018-11-11 DIAGNOSIS — F419 Anxiety disorder, unspecified: Secondary | ICD-10-CM | POA: Diagnosis not present

## 2018-11-11 DIAGNOSIS — Z6825 Body mass index (BMI) 25.0-25.9, adult: Secondary | ICD-10-CM | POA: Diagnosis not present

## 2018-11-11 DIAGNOSIS — I10 Essential (primary) hypertension: Secondary | ICD-10-CM | POA: Diagnosis not present

## 2018-11-11 DIAGNOSIS — I7300 Raynaud's syndrome without gangrene: Secondary | ICD-10-CM | POA: Diagnosis not present

## 2018-11-11 DIAGNOSIS — E039 Hypothyroidism, unspecified: Secondary | ICD-10-CM | POA: Diagnosis not present

## 2018-11-11 DIAGNOSIS — E785 Hyperlipidemia, unspecified: Secondary | ICD-10-CM | POA: Diagnosis not present

## 2018-11-11 DIAGNOSIS — Z299 Encounter for prophylactic measures, unspecified: Secondary | ICD-10-CM | POA: Diagnosis not present

## 2018-12-01 DIAGNOSIS — Z6827 Body mass index (BMI) 27.0-27.9, adult: Secondary | ICD-10-CM | POA: Diagnosis not present

## 2018-12-01 DIAGNOSIS — F419 Anxiety disorder, unspecified: Secondary | ICD-10-CM | POA: Diagnosis not present

## 2018-12-01 DIAGNOSIS — M1990 Unspecified osteoarthritis, unspecified site: Secondary | ICD-10-CM | POA: Diagnosis not present

## 2018-12-01 DIAGNOSIS — Z299 Encounter for prophylactic measures, unspecified: Secondary | ICD-10-CM | POA: Diagnosis not present

## 2018-12-01 DIAGNOSIS — I10 Essential (primary) hypertension: Secondary | ICD-10-CM | POA: Diagnosis not present

## 2018-12-15 DIAGNOSIS — Z2821 Immunization not carried out because of patient refusal: Secondary | ICD-10-CM | POA: Diagnosis not present

## 2018-12-15 DIAGNOSIS — Z299 Encounter for prophylactic measures, unspecified: Secondary | ICD-10-CM | POA: Diagnosis not present

## 2018-12-15 DIAGNOSIS — I10 Essential (primary) hypertension: Secondary | ICD-10-CM | POA: Diagnosis not present

## 2018-12-15 DIAGNOSIS — E039 Hypothyroidism, unspecified: Secondary | ICD-10-CM | POA: Diagnosis not present

## 2018-12-15 DIAGNOSIS — Z789 Other specified health status: Secondary | ICD-10-CM | POA: Diagnosis not present

## 2018-12-15 DIAGNOSIS — Z6827 Body mass index (BMI) 27.0-27.9, adult: Secondary | ICD-10-CM | POA: Diagnosis not present

## 2018-12-30 DIAGNOSIS — Z6827 Body mass index (BMI) 27.0-27.9, adult: Secondary | ICD-10-CM | POA: Diagnosis not present

## 2018-12-30 DIAGNOSIS — R58 Hemorrhage, not elsewhere classified: Secondary | ICD-10-CM | POA: Diagnosis not present

## 2018-12-30 DIAGNOSIS — I10 Essential (primary) hypertension: Secondary | ICD-10-CM | POA: Diagnosis not present

## 2018-12-30 DIAGNOSIS — L259 Unspecified contact dermatitis, unspecified cause: Secondary | ICD-10-CM | POA: Diagnosis not present

## 2018-12-30 DIAGNOSIS — Z299 Encounter for prophylactic measures, unspecified: Secondary | ICD-10-CM | POA: Diagnosis not present

## 2019-03-03 ENCOUNTER — Telehealth: Payer: Self-pay | Admitting: Cardiology

## 2019-03-03 NOTE — Telephone Encounter (Signed)
New Message:     Please call, pt says she does not understand why the pharmacist says it is too early to get her medicine refilled.

## 2019-03-03 NOTE — Telephone Encounter (Signed)
Left message for pt to call.

## 2019-03-08 NOTE — Telephone Encounter (Signed)
Spoke with pt, the pharmacy fixed the problem for the patient. She does not need anything at this time.

## 2019-03-15 DIAGNOSIS — I10 Essential (primary) hypertension: Secondary | ICD-10-CM | POA: Diagnosis not present

## 2019-03-15 DIAGNOSIS — Z299 Encounter for prophylactic measures, unspecified: Secondary | ICD-10-CM | POA: Diagnosis not present

## 2019-03-15 DIAGNOSIS — R21 Rash and other nonspecific skin eruption: Secondary | ICD-10-CM | POA: Diagnosis not present

## 2019-03-15 DIAGNOSIS — Z6828 Body mass index (BMI) 28.0-28.9, adult: Secondary | ICD-10-CM | POA: Diagnosis not present

## 2019-03-15 DIAGNOSIS — F419 Anxiety disorder, unspecified: Secondary | ICD-10-CM | POA: Diagnosis not present

## 2019-03-15 DIAGNOSIS — E039 Hypothyroidism, unspecified: Secondary | ICD-10-CM | POA: Diagnosis not present

## 2019-04-03 DIAGNOSIS — L0100 Impetigo, unspecified: Secondary | ICD-10-CM | POA: Diagnosis not present

## 2019-04-03 DIAGNOSIS — L821 Other seborrheic keratosis: Secondary | ICD-10-CM | POA: Diagnosis not present

## 2019-04-03 DIAGNOSIS — L570 Actinic keratosis: Secondary | ICD-10-CM | POA: Diagnosis not present

## 2019-05-01 DIAGNOSIS — L299 Pruritus, unspecified: Secondary | ICD-10-CM | POA: Diagnosis not present

## 2019-05-01 DIAGNOSIS — L819 Disorder of pigmentation, unspecified: Secondary | ICD-10-CM | POA: Diagnosis not present

## 2019-06-12 DIAGNOSIS — Z203 Contact with and (suspected) exposure to rabies: Secondary | ICD-10-CM | POA: Diagnosis not present

## 2019-06-12 DIAGNOSIS — Z885 Allergy status to narcotic agent status: Secondary | ICD-10-CM | POA: Diagnosis not present

## 2019-06-12 DIAGNOSIS — Z79899 Other long term (current) drug therapy: Secondary | ICD-10-CM | POA: Diagnosis not present

## 2019-06-15 DIAGNOSIS — Z23 Encounter for immunization: Secondary | ICD-10-CM | POA: Diagnosis not present

## 2019-06-15 DIAGNOSIS — Z203 Contact with and (suspected) exposure to rabies: Secondary | ICD-10-CM | POA: Diagnosis not present

## 2019-06-19 DIAGNOSIS — I10 Essential (primary) hypertension: Secondary | ICD-10-CM | POA: Diagnosis not present

## 2019-06-19 DIAGNOSIS — E039 Hypothyroidism, unspecified: Secondary | ICD-10-CM | POA: Diagnosis not present

## 2019-06-19 DIAGNOSIS — E7800 Pure hypercholesterolemia, unspecified: Secondary | ICD-10-CM | POA: Diagnosis not present

## 2019-06-19 DIAGNOSIS — Z6829 Body mass index (BMI) 29.0-29.9, adult: Secondary | ICD-10-CM | POA: Diagnosis not present

## 2019-06-19 DIAGNOSIS — Z23 Encounter for immunization: Secondary | ICD-10-CM | POA: Diagnosis not present

## 2019-06-19 DIAGNOSIS — Z203 Contact with and (suspected) exposure to rabies: Secondary | ICD-10-CM | POA: Diagnosis not present

## 2019-06-19 DIAGNOSIS — Z299 Encounter for prophylactic measures, unspecified: Secondary | ICD-10-CM | POA: Diagnosis not present

## 2019-06-19 DIAGNOSIS — M791 Myalgia, unspecified site: Secondary | ICD-10-CM | POA: Diagnosis not present

## 2019-06-19 DIAGNOSIS — F419 Anxiety disorder, unspecified: Secondary | ICD-10-CM | POA: Diagnosis not present

## 2019-06-26 DIAGNOSIS — Z203 Contact with and (suspected) exposure to rabies: Secondary | ICD-10-CM | POA: Diagnosis not present

## 2019-06-26 DIAGNOSIS — Z23 Encounter for immunization: Secondary | ICD-10-CM | POA: Diagnosis not present

## 2019-07-02 DIAGNOSIS — R1032 Left lower quadrant pain: Secondary | ICD-10-CM | POA: Diagnosis not present

## 2019-07-02 DIAGNOSIS — K5732 Diverticulitis of large intestine without perforation or abscess without bleeding: Secondary | ICD-10-CM | POA: Diagnosis not present

## 2019-07-02 DIAGNOSIS — I700 Atherosclerosis of aorta: Secondary | ICD-10-CM | POA: Diagnosis not present

## 2019-07-02 DIAGNOSIS — K5792 Diverticulitis of intestine, part unspecified, without perforation or abscess without bleeding: Secondary | ICD-10-CM | POA: Diagnosis not present

## 2019-07-02 DIAGNOSIS — R160 Hepatomegaly, not elsewhere classified: Secondary | ICD-10-CM | POA: Diagnosis not present

## 2019-07-02 DIAGNOSIS — Z79899 Other long term (current) drug therapy: Secondary | ICD-10-CM | POA: Diagnosis not present

## 2019-07-02 DIAGNOSIS — Z9049 Acquired absence of other specified parts of digestive tract: Secondary | ICD-10-CM | POA: Diagnosis not present

## 2019-07-02 DIAGNOSIS — K449 Diaphragmatic hernia without obstruction or gangrene: Secondary | ICD-10-CM | POA: Diagnosis not present

## 2019-07-06 ENCOUNTER — Other Ambulatory Visit: Payer: Self-pay

## 2019-07-06 DIAGNOSIS — R002 Palpitations: Secondary | ICD-10-CM

## 2019-07-06 DIAGNOSIS — E785 Hyperlipidemia, unspecified: Secondary | ICD-10-CM

## 2019-07-06 MED ORDER — METOPROLOL SUCCINATE ER 25 MG PO TB24: 12.5000 mg | ORAL_TABLET | Freq: Every day | ORAL | 0 refills | Status: DC

## 2019-07-07 DIAGNOSIS — I10 Essential (primary) hypertension: Secondary | ICD-10-CM | POA: Diagnosis not present

## 2019-07-07 DIAGNOSIS — E039 Hypothyroidism, unspecified: Secondary | ICD-10-CM | POA: Diagnosis not present

## 2019-07-07 DIAGNOSIS — K5732 Diverticulitis of large intestine without perforation or abscess without bleeding: Secondary | ICD-10-CM | POA: Diagnosis not present

## 2019-07-07 DIAGNOSIS — E7800 Pure hypercholesterolemia, unspecified: Secondary | ICD-10-CM | POA: Diagnosis not present

## 2019-07-07 DIAGNOSIS — Z299 Encounter for prophylactic measures, unspecified: Secondary | ICD-10-CM | POA: Diagnosis not present

## 2019-07-07 DIAGNOSIS — F410 Panic disorder [episodic paroxysmal anxiety] without agoraphobia: Secondary | ICD-10-CM | POA: Diagnosis not present

## 2019-07-07 DIAGNOSIS — Z6829 Body mass index (BMI) 29.0-29.9, adult: Secondary | ICD-10-CM | POA: Diagnosis not present

## 2019-07-13 ENCOUNTER — Telehealth: Payer: Self-pay | Admitting: Cardiology

## 2019-07-13 DIAGNOSIS — E785 Hyperlipidemia, unspecified: Secondary | ICD-10-CM

## 2019-07-13 DIAGNOSIS — R002 Palpitations: Secondary | ICD-10-CM

## 2019-07-13 MED ORDER — METOPROLOL SUCCINATE ER 25 MG PO TB24: 12.5000 mg | ORAL_TABLET | Freq: Every day | ORAL | 0 refills | Status: DC

## 2019-07-13 NOTE — Telephone Encounter (Signed)
Requested Prescriptions   Signed Prescriptions Disp Refills  . metoprolol succinate (TOPROL-XL) 25 MG 24 hr tablet 45 tablet 0    Sig: Take 0.5 tablets (12.5 mg total) by mouth daily.    Authorizing Provider: Lelon Perla    Ordering User: Raelene Bott, BRANDY L

## 2019-07-13 NOTE — Telephone Encounter (Signed)
° °*  STAT* If patient is at the pharmacy, call can be transferred to refill team.   1. Which medications need to be refilled? (please list name of each medication and dose if known) METOPROLOL SUCCINATE 25mg   2. Which pharmacy/location (including street and city if local pharmacy) is medication to be sent to? [place]  [id number] [place]  [address] [phone]       [facility]   3. Do they need a 30 day or 90 day supply? 45   90?

## 2019-07-17 DIAGNOSIS — R109 Unspecified abdominal pain: Secondary | ICD-10-CM | POA: Diagnosis not present

## 2019-07-17 DIAGNOSIS — F419 Anxiety disorder, unspecified: Secondary | ICD-10-CM | POA: Diagnosis not present

## 2019-07-17 DIAGNOSIS — Z789 Other specified health status: Secondary | ICD-10-CM | POA: Diagnosis not present

## 2019-07-17 DIAGNOSIS — Z6829 Body mass index (BMI) 29.0-29.9, adult: Secondary | ICD-10-CM | POA: Diagnosis not present

## 2019-07-17 DIAGNOSIS — Z299 Encounter for prophylactic measures, unspecified: Secondary | ICD-10-CM | POA: Diagnosis not present

## 2019-07-17 DIAGNOSIS — K219 Gastro-esophageal reflux disease without esophagitis: Secondary | ICD-10-CM | POA: Diagnosis not present

## 2019-07-17 DIAGNOSIS — K5732 Diverticulitis of large intestine without perforation or abscess without bleeding: Secondary | ICD-10-CM | POA: Diagnosis not present

## 2019-08-25 ENCOUNTER — Other Ambulatory Visit: Payer: Self-pay | Admitting: Cardiology

## 2019-08-25 DIAGNOSIS — E785 Hyperlipidemia, unspecified: Secondary | ICD-10-CM

## 2019-08-25 DIAGNOSIS — R002 Palpitations: Secondary | ICD-10-CM

## 2019-09-26 DIAGNOSIS — Z299 Encounter for prophylactic measures, unspecified: Secondary | ICD-10-CM | POA: Diagnosis not present

## 2019-09-26 DIAGNOSIS — Z1331 Encounter for screening for depression: Secondary | ICD-10-CM | POA: Diagnosis not present

## 2019-09-26 DIAGNOSIS — Z0000 Encounter for general adult medical examination without abnormal findings: Secondary | ICD-10-CM | POA: Diagnosis not present

## 2019-09-26 DIAGNOSIS — Z1339 Encounter for screening examination for other mental health and behavioral disorders: Secondary | ICD-10-CM | POA: Diagnosis not present

## 2019-09-26 DIAGNOSIS — Z79899 Other long term (current) drug therapy: Secondary | ICD-10-CM | POA: Diagnosis not present

## 2019-09-26 DIAGNOSIS — Z1211 Encounter for screening for malignant neoplasm of colon: Secondary | ICD-10-CM | POA: Diagnosis not present

## 2019-09-26 DIAGNOSIS — E039 Hypothyroidism, unspecified: Secondary | ICD-10-CM | POA: Diagnosis not present

## 2019-09-26 DIAGNOSIS — Z6830 Body mass index (BMI) 30.0-30.9, adult: Secondary | ICD-10-CM | POA: Diagnosis not present

## 2019-09-26 DIAGNOSIS — Z7189 Other specified counseling: Secondary | ICD-10-CM | POA: Diagnosis not present

## 2019-09-26 DIAGNOSIS — I10 Essential (primary) hypertension: Secondary | ICD-10-CM | POA: Diagnosis not present

## 2019-09-26 DIAGNOSIS — R5383 Other fatigue: Secondary | ICD-10-CM | POA: Diagnosis not present

## 2019-09-26 DIAGNOSIS — E7800 Pure hypercholesterolemia, unspecified: Secondary | ICD-10-CM | POA: Diagnosis not present

## 2019-10-06 NOTE — Progress Notes (Deleted)
HPI: FU palpitations and chest pain. Echocardiogram in September of 2014 showed normal LV function. There was grade 1 diastolic dysfunction. Trace mitral regurgitation. Patient has had intermittent palpitations for years. Exercise treadmill in November of 2014 showed poor exercise tolerance. There was no chest pain, no ECG changes and no arrhythmias. Monitor in November of 2014 showed sinus rhythm with PACs and PVCs. Since I last saw her,   Current Outpatient Medications  Medication Sig Dispense Refill  . alprazolam (XANAX) 2 MG tablet Take 1-2 mg by mouth every 6 (six) hours.     . Cholecalciferol (VITAMIN D3) 2000 units TABS Take 2,000 Units by mouth daily.     Marland Kitchen docusate sodium (STOOL SOFTENER) 100 MG capsule Take 1 capsule (100 mg total) by mouth 2 (two) times daily. 60 capsule 0  . metoprolol succinate (TOPROL-XL) 25 MG 24 hr tablet TAKE 1/2 TABLET BY MOUTH EVERY DAY 45 tablet 0   No current facility-administered medications for this visit.      Past Medical History:  Diagnosis Date  . Agoraphobia with panic disorder 1982  . Brown recluse spider bite   . Constipation   . Diverticulitis   . GERD (gastroesophageal reflux disease)   . Hyperlipidemia   . Lyme disease   . Migraines    "don't have them since menopause" (09/23/2016)  . Panic disorder   . Shingles     Past Surgical History:  Procedure Laterality Date  . APPENDECTOMY  1964  . CHOLECYSTECTOMY OPEN  1966  . COLONOSCOPY  2012   RMR: 1. External hemorrhoids, otherwise normal rectum. 2. Left-sided diverticula status post snare polypectomy left colon polyp. Remainder of the colonic mucosa and terminal ileum mucoa appeared normal.   . COLONOSCOPY N/A 02/11/2017   Procedure: COLONOSCOPY;  Surgeon: Daneil Dolin, MD;  Location: [facility];  Service: Endoscopy;  Laterality: N/A;  1200  . DILATION AND CURETTAGE OF UTERUS    . TONSILLECTOMY  1961    Social History   Socioeconomic History  . Marital status:  Widowed    Spouse name: Not on file  . Number of children: 5  . Years of education: Not on file  . Highest education level: Not on file  Occupational History  . Not on file  Social Needs  . Financial resource strain: Not on file  . Food insecurity    Worry: Not on file    Inability: Not on file  . Transportation needs    Medical: Not on file    Non-medical: Not on file  Tobacco Use  . Smoking status: Never Smoker  . Smokeless tobacco: Never Used  Substance and Sexual Activity  . Alcohol use: No    Alcohol/week: 0.0 standard drinks  . Drug use: No  . Sexual activity: Never  Lifestyle  . Physical activity    Days per week: Not on file    Minutes per session: Not on file  . Stress: Not on file  Relationships  . Social Herbalist on phone: Not on file    Gets together: Not on file    Attends religious service: Not on file    Active member of club or organization: Not on file    Attends meetings of clubs or organizations: Not on file    Relationship status: Not on file  . Intimate partner violence    Fear of current or ex partner: Not on file    Emotionally abused: Not  on file    Physically abused: Not on file    Forced sexual activity: Not on file  Other Topics Concern  . Not on file  Social History Narrative  . Not on file    Family History  Problem Relation Age of Onset  . Diabetes Mother   . Hypertension Mother   . Heart attack Mother   . Cancer Father        Melanoma   . Diabetes Brother   . Diabetes Son   . Colon cancer Neg Hx     ROS: no fevers or chills, productive cough, hemoptysis, dysphasia, odynophagia, melena, hematochezia, dysuria, hematuria, rash, seizure activity, orthopnea, PND, pedal edema, claudication. Remaining systems are negative.  Physical Exam: Well-developed well-nourished in no acute distress.  Skin is warm and dry.  HEENT is normal.  Neck is supple.  Chest is clear to auscultation with normal expansion.   Cardiovascular exam is regular rate and rhythm.  Abdominal exam nontender or distended. No masses palpated. Extremities show no edema. neuro grossly intact  ECG- personally reviewed  A/P  1 palpitations-symptoms are well controlled with Toprol.  Continue present dose.  2 chest pain-she has not had further chest pain and previous exercise treadmill was negative.  No plans for further evaluation at this point.  3 hyperlipidemia-Per primary care.  Kirk Ruths, MD

## 2019-10-13 ENCOUNTER — Ambulatory Visit: Payer: Medicare Other | Admitting: Cardiology

## 2019-11-13 DIAGNOSIS — D692 Other nonthrombocytopenic purpura: Secondary | ICD-10-CM | POA: Diagnosis not present

## 2019-11-13 DIAGNOSIS — Z299 Encounter for prophylactic measures, unspecified: Secondary | ICD-10-CM | POA: Diagnosis not present

## 2019-11-13 DIAGNOSIS — Z789 Other specified health status: Secondary | ICD-10-CM | POA: Diagnosis not present

## 2019-11-13 DIAGNOSIS — Z6830 Body mass index (BMI) 30.0-30.9, adult: Secondary | ICD-10-CM | POA: Diagnosis not present

## 2019-11-13 DIAGNOSIS — E039 Hypothyroidism, unspecified: Secondary | ICD-10-CM | POA: Diagnosis not present

## 2019-11-13 DIAGNOSIS — H1030 Unspecified acute conjunctivitis, unspecified eye: Secondary | ICD-10-CM | POA: Diagnosis not present

## 2019-11-13 DIAGNOSIS — F419 Anxiety disorder, unspecified: Secondary | ICD-10-CM | POA: Diagnosis not present

## 2019-12-04 ENCOUNTER — Other Ambulatory Visit: Payer: Self-pay

## 2019-12-04 DIAGNOSIS — E785 Hyperlipidemia, unspecified: Secondary | ICD-10-CM

## 2019-12-04 DIAGNOSIS — R002 Palpitations: Secondary | ICD-10-CM

## 2019-12-04 MED ORDER — METOPROLOL SUCCINATE ER 25 MG PO TB24: 12.5000 mg | ORAL_TABLET | Freq: Every day | ORAL | 0 refills | Status: DC

## 2020-01-06 ENCOUNTER — Encounter (HOSPITAL_COMMUNITY): Payer: Self-pay | Admitting: *Deleted

## 2020-01-06 ENCOUNTER — Emergency Department (HOSPITAL_COMMUNITY)
Admission: EM | Admit: 2020-01-06 | Discharge: 2020-01-07 | Disposition: A | Discharge status: Home / Self Care | Payer: Medicare Other | Attending: Emergency Medicine | Admitting: Emergency Medicine

## 2020-01-06 ENCOUNTER — Other Ambulatory Visit: Payer: Self-pay

## 2020-01-06 DIAGNOSIS — N3289 Other specified disorders of bladder: Secondary | ICD-10-CM | POA: Diagnosis not present

## 2020-01-06 DIAGNOSIS — R1030 Lower abdominal pain, unspecified: Secondary | ICD-10-CM | POA: Diagnosis not present

## 2020-01-06 DIAGNOSIS — Z79899 Other long term (current) drug therapy: Secondary | ICD-10-CM | POA: Insufficient documentation

## 2020-01-06 DIAGNOSIS — K5792 Diverticulitis of intestine, part unspecified, without perforation or abscess without bleeding: Secondary | ICD-10-CM

## 2020-01-06 DIAGNOSIS — R197 Diarrhea, unspecified: Secondary | ICD-10-CM | POA: Diagnosis not present

## 2020-01-06 DIAGNOSIS — R350 Frequency of micturition: Secondary | ICD-10-CM | POA: Diagnosis not present

## 2020-01-06 DIAGNOSIS — R509 Fever, unspecified: Secondary | ICD-10-CM | POA: Insufficient documentation

## 2020-01-06 DIAGNOSIS — R1032 Left lower quadrant pain: Secondary | ICD-10-CM | POA: Diagnosis present

## 2020-01-06 DIAGNOSIS — K5732 Diverticulitis of large intestine without perforation or abscess without bleeding: Secondary | ICD-10-CM | POA: Diagnosis not present

## 2020-01-06 DIAGNOSIS — K449 Diaphragmatic hernia without obstruction or gangrene: Secondary | ICD-10-CM | POA: Diagnosis not present

## 2020-01-06 LAB — URINALYSIS, ROUTINE W REFLEX MICROSCOPIC
Bacteria, UA: NONE SEEN
Bilirubin Urine: NEGATIVE
Glucose, UA: NEGATIVE mg/dL
Ketones, ur: NEGATIVE mg/dL
Nitrite: NEGATIVE
Protein, ur: NEGATIVE mg/dL
Specific Gravity, Urine: 1.02 (ref 1.005–1.030)
pH: 5 (ref 5.0–8.0)

## 2020-01-06 LAB — COMPREHENSIVE METABOLIC PANEL
ALT: 33 U/L (ref 0–44)
AST: 38 U/L (ref 15–41)
Albumin: 3.8 g/dL (ref 3.5–5.0)
Alkaline Phosphatase: 124 U/L (ref 38–126)
Anion gap: 10 (ref 5–15)
BUN: 10 mg/dL (ref 8–23)
CO2: 24 mmol/L (ref 22–32)
Calcium: 9.4 mg/dL (ref 8.9–10.3)
Chloride: 104 mmol/L (ref 98–111)
Creatinine, Ser: 0.89 mg/dL (ref 0.44–1.00)
GFR calc Af Amer: >60 mL/min (ref >60)
GFR calc non Af Amer: >60 mL/min (ref >60)
Glucose, Bld: 118 mg/dL — ABNORMAL HIGH (ref 70–99)
Potassium: 3.5 mmol/L (ref 3.5–5.1)
Sodium: 138 mmol/L (ref 135–145)
Total Bilirubin: 1.5 mg/dL — ABNORMAL HIGH (ref 0.3–1.2)
Total Protein: 7.4 g/dL (ref 6.5–8.1)

## 2020-01-06 LAB — LIPASE, BLOOD: Lipase: 27 U/L (ref 11–51)

## 2020-01-06 LAB — CBC
HCT: 41.5 % (ref 36.0–46.0)
Hemoglobin: 13.6 g/dL (ref 12.0–15.0)
MCH: 32.5 pg (ref 26.0–34.0)
MCHC: 32.8 g/dL (ref 30.0–36.0)
MCV: 99.3 fL (ref 80.0–100.0)
Platelets: 341 10*3/uL (ref 150–400)
RBC: 4.18 MIL/uL (ref 3.87–5.11)
RDW: 12.9 % (ref 11.5–15.5)
WBC: 11.2 10*3/uL — ABNORMAL HIGH (ref 4.0–10.5)
nRBC: 0 % (ref 0.0–0.2)

## 2020-01-06 MED ORDER — SODIUM CHLORIDE 0.9% FLUSH: 3.0000 mL | Freq: Once | INTRAVENOUS | Status: DC

## 2020-01-06 NOTE — ED Triage Notes (Signed)
Pt reports onset of abdominal pain yesterday (lower yesterday, now towards the left today, radiates to the back). +Diarrhea and fevers. Hx of diverticulitis.

## 2020-01-07 ENCOUNTER — Emergency Department (HOSPITAL_COMMUNITY): Payer: Medicare Other

## 2020-01-07 DIAGNOSIS — K449 Diaphragmatic hernia without obstruction or gangrene: Secondary | ICD-10-CM | POA: Diagnosis not present

## 2020-01-07 DIAGNOSIS — K5792 Diverticulitis of intestine, part unspecified, without perforation or abscess without bleeding: Secondary | ICD-10-CM | POA: Diagnosis not present

## 2020-01-07 DIAGNOSIS — K5732 Diverticulitis of large intestine without perforation or abscess without bleeding: Secondary | ICD-10-CM | POA: Diagnosis not present

## 2020-01-07 MED ORDER — SODIUM CHLORIDE 0.9 % IV BOLUS
1000.0000 mL | Freq: Once | INTRAVENOUS | Status: AC
  Administered 2020-01-07: 1000 mL via INTRAVENOUS

## 2020-01-07 MED ORDER — FENTANYL CITRATE (PF) 100 MCG/2ML IJ SOLN
50.0000 ug | Freq: Once | INTRAMUSCULAR | Status: AC
  Administered 2020-01-07: 50 ug via INTRAVENOUS
  Filled 2020-01-07: qty 2

## 2020-01-07 MED ORDER — PHENAZOPYRIDINE HCL 100 MG PO TABS
95.0000 mg | ORAL_TABLET | Freq: Once | ORAL | Status: AC
  Administered 2020-01-07: 100 mg via ORAL
  Filled 2020-01-07: qty 1

## 2020-01-07 MED ORDER — OXYCODONE-ACETAMINOPHEN 5-325 MG PO TABS: 2.0000 | ORAL_TABLET | Freq: Four times a day (QID) | ORAL | 0 refills | Status: DC | PRN

## 2020-01-07 MED ORDER — ONDANSETRON HCL 4 MG/2ML IJ SOLN
4.0000 mg | Freq: Once | INTRAMUSCULAR | Status: AC
  Administered 2020-01-07: 4 mg via INTRAVENOUS
  Filled 2020-01-07: qty 2

## 2020-01-07 MED ORDER — IOHEXOL 300 MG/ML  SOLN
100.0000 mL | Freq: Once | INTRAMUSCULAR | Status: AC | PRN
  Administered 2020-01-07: 100 mL via INTRAVENOUS

## 2020-01-07 MED ORDER — AMOXICILLIN-POT CLAVULANATE 875-125 MG PO TABS
1.0000 | ORAL_TABLET | Freq: Once | ORAL | Status: AC
  Administered 2020-01-07: 1 via ORAL
  Filled 2020-01-07: qty 1

## 2020-01-07 MED ORDER — ACETAMINOPHEN 325 MG PO TABS
650.0000 mg | ORAL_TABLET | Freq: Once | ORAL | Status: AC
  Administered 2020-01-07: 650 mg via ORAL
  Filled 2020-01-07: qty 2

## 2020-01-07 MED ORDER — PHENAZOPYRIDINE HCL 100 MG PO TABS: 200.0000 mg | ORAL_TABLET | Freq: Three times a day (TID) | ORAL | 0 refills | Status: DC | PRN

## 2020-01-07 NOTE — ED Notes (Signed)
Pt ambulated in room and to bathroom with no assistance complaining of back pain from position of bed and abdominal pain still present.

## 2020-01-07 NOTE — ED Notes (Signed)
Pt given crackers and water which she consumed without issue.

## 2020-01-07 NOTE — Discharge Instructions (Addendum)
You were seen today for diverticulitis.  CT scan confirms uncomplicated diverticulitis.  Continue antibiotics at home.  Take Percocet and Pyridium for pain.  You likely have some bladder spasm related to inflammation of your colon.  If you continue to have fever or worsening pain, you should be reevaluated.

## 2020-01-07 NOTE — ED Notes (Signed)
Pt verbalized understanding of d/c instructions, follow up care, s/s of need for return to ed and perscriptions. Pt had no additional questions at this time

## 2020-01-07 NOTE — ED Provider Notes (Signed)
[facility] EMERGENCY DEPARTMENT Provider Note   CSN: [id number] Arrival date & time: 01/06/20  1914     History Chief Complaint  Patient presents with  . Abdominal Pain    Alexa Wilson is a 74 y.o. female.  HPI     This is a 74 year old female with a history of diverticulitis, panic disorder, hyperlipidemia, reflux who presents with left lower quadrant pain.  Patient reports onset of suprapubic and left lower quadrant pain since yesterday.  She has noted some urinary frequency and diarrhea.  She also noted temperature to 101.  She rates her pain at 8 out of 10.  She took Tylenol with some relief of both her fever and her pain.  She is not had any nausea or vomiting.  No bloody stools.  Pain does not seem to radiate.  No dysuria or flank pain.  She does state "my hips hurt."  Patient started Augmentin and took 1 dose earlier today.  Past Medical History:  Diagnosis Date  . Agoraphobia with panic disorder 1982  . Brown recluse spider bite   . Constipation   . Diverticulitis   . GERD (gastroesophageal reflux disease)   . Hyperlipidemia   . Lyme disease   . Migraines    "don't have them since menopause" (09/23/2016)  . Panic disorder   . Shingles     Patient Active Problem List   Diagnosis Date Noted  . Diverticulitis 09/22/2016  . Acute hypokalemia 09/22/2016  . Hyperlipidemia 11/15/2015  . Constipation 04/09/2015  . Palpitations 08/11/2013  . Chest pressure 07/15/2013  . Right arm weakness 07/15/2013  . Sigmoid diverticulitis 07/14/2012  . DYSPEPSIA 09/06/2009  . IBS 09/06/2009  . COLONIC POLYPS, HX OF 09/06/2009  . WEIGHT LOSS 06/07/2009  . NAUSEA AND VOMITING 06/07/2009  . Abdominal pain 06/07/2009  . EPIGASTRIC PAIN 06/07/2009  . COLONIC POLYPS 02/21/2009  . DYSPHAGIA 02/21/2009  . PANIC DISORDER 02/20/2009  . CATARACTS 02/20/2009  . DIVERTICULOSIS OF COLON 02/20/2009  . HELICOBACTER PYLORI GASTRITIS, HX OF 02/20/2009    Past  Surgical History:  Procedure Laterality Date  . APPENDECTOMY  1964  . CHOLECYSTECTOMY OPEN  1966  . COLONOSCOPY  2012   RMR: 1. External hemorrhoids, otherwise normal rectum. 2. Left-sided diverticula status post snare polypectomy left colon polyp. Remainder of the colonic mucosa and terminal ileum mucoa appeared normal.   . COLONOSCOPY N/A 02/11/2017   Procedure: COLONOSCOPY;  Surgeon: Daneil Dolin, MD;  Location: [facility];  Service: Endoscopy;  Laterality: N/A;  1200  . DILATION AND CURETTAGE OF UTERUS    . TONSILLECTOMY  1961     OB History   No obstetric history on file.     Family History  Problem Relation Age of Onset  . Diabetes Mother   . Hypertension Mother   . Heart attack Mother   . Cancer Father        Melanoma   . Diabetes Brother   . Diabetes Son   . Colon cancer Neg Hx     Social History   Tobacco Use  . Smoking status: Never Smoker  . Smokeless tobacco: Never Used  Substance Use Topics  . Alcohol use: No    Alcohol/week: 0.0 standard drinks  . Drug use: No    Home Medications Prior to Admission medications   Medication Sig Start Date End Date Taking? Authorizing Provider  alprazolam Duanne Moron) 2 MG tablet Take 1-2 mg by mouth See admin instructions. Take 1/2  tablet 8 am and 8 pm then take 1 tablet at 2 pm   Yes [provider]  amoxicillin-clavulanate (AUGMENTIN) 500-125 MG tablet Take 1 tablet by mouth 2 (two) times daily. 01/06/20  Yes [provider]  levothyroxine (SYNTHROID) 25 MCG tablet Take 25 mcg by mouth daily. 12/11/19  Yes [provider]  metoprolol succinate (TOPROL-XL) 25 MG 24 hr tablet Take 0.5 tablets (12.5 mg total) by mouth daily. Patient taking differently: Take 12.5 mg by mouth at bedtime.  12/04/19  Yes Lelon Perla, MD  rosuvastatin (CRESTOR) 10 MG tablet Take 10 mg by mouth at bedtime. 11/10/19  Yes [provider]  docusate sodium (STOOL SOFTENER) 100 MG capsule Take 1 capsule (100 mg total)  by mouth 2 (two) times daily. Patient not taking: Reported on 01/07/2020 06/07/18   Shelly Coss, MD  oxyCODONE-acetaminophen (PERCOCET/ROXICET) 5-325 MG tablet Take 2 tablets by mouth every 6 (six) hours as needed for severe pain. 01/07/20   Tamina Cyphers, Barbette Hair, MD  phenazopyridine (PYRIDIUM) 100 MG tablet Take 2 tablets (200 mg total) by mouth 3 (three) times daily as needed for pain. 01/07/20   Kacey Dysert, Barbette Hair, MD    Allergies    Ciprofloxacin, Codeine, Metronidazole, Morphine and related, and Other  Review of Systems   Review of Systems  Constitutional: Positive for fever.  Respiratory: Negative for cough and shortness of breath.   Cardiovascular: Negative for chest pain.  Gastrointestinal: Positive for abdominal pain and diarrhea. Negative for nausea and vomiting.  Genitourinary: Positive for frequency. Negative for dysuria.  Musculoskeletal: Negative for back pain.  All other systems reviewed and are negative.   Physical Exam Updated Vital Signs BP (!) 115/56   Pulse 97   Temp 98.9 F (37.2 C) (Oral)   Resp 15   SpO2 98%   Physical Exam Vitals and nursing note reviewed.  Constitutional:      Appearance: She is well-developed. She is not ill-appearing.  HENT:     Head: Normocephalic and atraumatic.  Eyes:     Pupils: Pupils are equal, round, and reactive to light.  Cardiovascular:     Rate and Rhythm: Normal rate and regular rhythm.     Heart sounds: Normal heart sounds.  Pulmonary:     Effort: Pulmonary effort is normal. No respiratory distress.     Breath sounds: No wheezing.  Abdominal:     General: Bowel sounds are normal.     Palpations: Abdomen is soft.     Tenderness: There is abdominal tenderness in the suprapubic area and left lower quadrant. There is guarding.     Comments: Voluntary guarding  Musculoskeletal:     Cervical back: Neck supple.  Skin:    General: Skin is warm and dry.  Neurological:     Mental Status: She is alert and oriented to person,  place, and time.  Psychiatric:        Mood and Affect: Mood normal.     ED Results / Procedures / Treatments   Labs (all labs ordered are listed, but only abnormal results are displayed) Labs Reviewed  COMPREHENSIVE METABOLIC PANEL - Abnormal; Notable for the following components:      Result Value   Glucose, Bld 118 (*)    Total Bilirubin 1.5 (*)    All other components within normal limits  CBC - Abnormal; Notable for the following components:   WBC 11.2 (*)    All other components within normal limits  URINALYSIS, ROUTINE W REFLEX  MICROSCOPIC - Abnormal; Notable for the following components:   APPearance HAZY (*)    Hgb urine dipstick SMALL (*)    Leukocytes,Ua SMALL (*)    All other components within normal limits  URINE CULTURE  LIPASE, BLOOD    EKG None  Radiology CT ABDOMEN PELVIS W CONTRAST  Result Date: 01/07/2020 CLINICAL DATA:  Left lower quadrant pain. Fever and diarrhea. EXAM: CT ABDOMEN AND PELVIS WITH CONTRAST TECHNIQUE: Multidetector CT imaging of the abdomen and pelvis was performed using the standard protocol following bolus administration of intravenous contrast. CONTRAST:  163mL OMNIPAQUE IOHEXOL 300 MG/ML  SOLN COMPARISON:  Most recent CT 07/02/2019 at [place]: Lower chest: Linear scarring in the left lower lobe. Mild hypoventilatory changes at the lung bases. There are coronary artery calcifications. Hepatobiliary: No focal hepatic abnormality. Hepatomegaly again seen, liver spans 20 cm cranial caudal. Postcholecystectomy without biliary dilatation. Pancreas: No ductal dilatation or inflammation. Spleen: Normal in size without focal abnormality. Adrenals/Urinary Tract: Normal adrenal glands. No hydronephrosis or perinephric edema. Homogeneous renal enhancement with symmetric excretion on delayed phase imaging. Urinary bladder is physiologically distended without wall thickening. Stomach/Bowel: Inflamed diverticulum in the proximal sigmoid colon  with colonic wall thickening, pericolonic edema and inflammatory change, series 3, image 70. There is a second inflamed diverticulum in the more distal sigmoid colon with surrounding fat stranding and colonic wall thickening, series 3, image 74. Minimal adjacent free fluid but no perforation or abscess. Multiple additional noninflamed diverticula in the descending and sigmoid colon. No element of mural hypertrophy in the sigmoid colon is seen. Adjacent small bowel are fluid-filled and mildly prominent, consistent with reactive ileus. No evidence of obstruction. Stomach is nondistended, small hiatal hernia. Vascular/Lymphatic: Aortic atherosclerosis. No aortic aneurysm. Patent portal vein. No portal venous or mesenteric gas. Prominent lymph nodes along the sigmoid mesentery measuring up to 6 mm, likely reactive. Reproductive: Uterus and bilateral adnexa are unremarkable. Other: Fat stranding and small amount of free fluid in the pelvis. No abscess or free air. Musculoskeletal: There are no acute or suspicious osseous abnormalities. Again seen degenerative change in the spine. IMPRESSION: 1. Two separate areas of uncomplicated diverticulitis in the sigmoid colon. No perforation or abscess. 2. Small hiatal hernia. Aortic Atherosclerosis (ICD10-I70.0). Electronically Signed   By: Keith Rake M.D.   On: 01/07/2020 02:47    Procedures Procedures (including critical care time)  Medications Ordered in ED Medications  sodium chloride flush (NS) 0.9 % injection 3 mL (has no administration in time range)  phenazopyridine (PYRIDIUM) tablet 100 mg (has no administration in time range)  acetaminophen (TYLENOL) tablet 650 mg (has no administration in time range)  ondansetron (ZOFRAN) injection 4 mg (4 mg Intravenous Given 01/07/20 0059)  fentaNYL (SUBLIMAZE) injection 50 mcg (50 mcg Intravenous Given 01/07/20 0059)  iohexol (OMNIPAQUE) 300 MG/ML solution 100 mL (100 mLs Intravenous Contrast Given 01/07/20 0204)    amoxicillin-clavulanate (AUGMENTIN) 875-125 MG per tablet 1 tablet (1 tablet Oral Given 01/07/20 0345)  sodium chloride 0.9 % bolus 1,000 mL (0 mLs Intravenous Stopped 01/07/20 0610)  fentaNYL (SUBLIMAZE) injection 50 mcg (50 mcg Intravenous Given 01/07/20 0343)    ED Course  I have reviewed the triage vital signs and the nursing notes.  Pertinent labs & imaging results that were available during my care of the patient were reviewed by me and considered in my medical decision making (see chart for details).    MDM Rules/Calculators/A&P  Patient presents with left lower quadrant suprapubic pain.  She is uncomfortable appearing but nontoxic.  Tenderness on exam without evidence of peritonitis.  She is afebrile here.  Lab work notable for leukocytosis.  Patient was given pain and nausea medication.  At this point she has not technically failed outpatient antibiotics as she only had 1 dose of antibiotics.  Urinalysis without evidence of infection.  CT scan shows uncomplicated diverticulitis.  Patient given second dose of Augmentin.  She continues to complain of pain but mostly pain after urination with frequency.  Suspect she may have some bladder spasm given location of diverticulitis and inflammation on her CT scan.  Will give Pyridium.  Urine culture also sent.  Recommend continuing Augmentin.  Will discharge with Percocet and Pyridium.  She was given strict return precautions.  Final Clinical Impression(s) / ED Diagnoses Final diagnoses:  Diverticulitis  Bladder spasm    Rx / DC Orders ED Discharge Orders         Ordered    oxyCODONE-acetaminophen (PERCOCET/ROXICET) 5-325 MG tablet  Every 6 hours PRN     01/07/20 0650    phenazopyridine (PYRIDIUM) 100 MG tablet  3 times daily PRN     01/07/20 0650           Merryl Hacker, MD 01/07/20 [phone]

## 2020-01-07 NOTE — ED Notes (Signed)
Pt at CT

## 2020-01-08 LAB — URINE CULTURE: Culture: NO GROWTH

## 2020-01-09 DIAGNOSIS — K5792 Diverticulitis of intestine, part unspecified, without perforation or abscess without bleeding: Secondary | ICD-10-CM | POA: Diagnosis not present

## 2020-01-09 DIAGNOSIS — Z299 Encounter for prophylactic measures, unspecified: Secondary | ICD-10-CM | POA: Diagnosis not present

## 2020-01-09 DIAGNOSIS — I10 Essential (primary) hypertension: Secondary | ICD-10-CM | POA: Diagnosis not present

## 2020-01-09 DIAGNOSIS — N3289 Other specified disorders of bladder: Secondary | ICD-10-CM | POA: Diagnosis not present

## 2020-01-10 ENCOUNTER — Other Ambulatory Visit: Payer: Self-pay

## 2020-01-10 DIAGNOSIS — Z20822 Contact with and (suspected) exposure to covid-19: Secondary | ICD-10-CM | POA: Diagnosis present

## 2020-01-10 DIAGNOSIS — K219 Gastro-esophageal reflux disease without esophagitis: Secondary | ICD-10-CM | POA: Diagnosis present

## 2020-01-10 DIAGNOSIS — I10 Essential (primary) hypertension: Secondary | ICD-10-CM | POA: Diagnosis present

## 2020-01-10 DIAGNOSIS — K5732 Diverticulitis of large intestine without perforation or abscess without bleeding: Secondary | ICD-10-CM | POA: Diagnosis not present

## 2020-01-10 DIAGNOSIS — M545 Low back pain: Secondary | ICD-10-CM | POA: Diagnosis present

## 2020-01-10 DIAGNOSIS — E785 Hyperlipidemia, unspecified: Secondary | ICD-10-CM | POA: Diagnosis present

## 2020-01-10 DIAGNOSIS — K5720 Diverticulitis of large intestine with perforation and abscess without bleeding: Principal | ICD-10-CM | POA: Diagnosis present

## 2020-01-10 DIAGNOSIS — F4001 Agoraphobia with panic disorder: Secondary | ICD-10-CM | POA: Diagnosis present

## 2020-01-10 DIAGNOSIS — R509 Fever, unspecified: Secondary | ICD-10-CM | POA: Diagnosis not present

## 2020-01-10 DIAGNOSIS — Z79899 Other long term (current) drug therapy: Secondary | ICD-10-CM

## 2020-01-10 DIAGNOSIS — E876 Hypokalemia: Secondary | ICD-10-CM | POA: Diagnosis present

## 2020-01-10 DIAGNOSIS — Z79890 Hormone replacement therapy (postmenopausal): Secondary | ICD-10-CM

## 2020-01-10 DIAGNOSIS — N3289 Other specified disorders of bladder: Secondary | ICD-10-CM | POA: Diagnosis present

## 2020-01-10 DIAGNOSIS — Z9049 Acquired absence of other specified parts of digestive tract: Secondary | ICD-10-CM

## 2020-01-10 DIAGNOSIS — E039 Hypothyroidism, unspecified: Secondary | ICD-10-CM | POA: Diagnosis present

## 2020-01-10 DIAGNOSIS — G8929 Other chronic pain: Secondary | ICD-10-CM | POA: Diagnosis present

## 2020-01-11 ENCOUNTER — Emergency Department (HOSPITAL_COMMUNITY): Payer: Medicare HMO

## 2020-01-11 ENCOUNTER — Encounter (HOSPITAL_COMMUNITY): Payer: Self-pay | Admitting: Emergency Medicine

## 2020-01-11 ENCOUNTER — Inpatient Hospital Stay (HOSPITAL_COMMUNITY)
Admission: EM | Admit: 2020-01-11 | Discharge: 2020-01-14 | DRG: 392 | Disposition: A | Discharge status: Home / Self Care | Payer: Medicare HMO | Attending: Internal Medicine | Admitting: Internal Medicine

## 2020-01-11 ENCOUNTER — Other Ambulatory Visit: Payer: Self-pay

## 2020-01-11 DIAGNOSIS — Z79899 Other long term (current) drug therapy: Secondary | ICD-10-CM | POA: Diagnosis not present

## 2020-01-11 DIAGNOSIS — K219 Gastro-esophageal reflux disease without esophagitis: Secondary | ICD-10-CM | POA: Diagnosis present

## 2020-01-11 DIAGNOSIS — K5720 Diverticulitis of large intestine with perforation and abscess without bleeding: Secondary | ICD-10-CM | POA: Diagnosis not present

## 2020-01-11 DIAGNOSIS — K5780 Diverticulitis of intestine, part unspecified, with perforation and abscess without bleeding: Secondary | ICD-10-CM | POA: Diagnosis present

## 2020-01-11 DIAGNOSIS — N3289 Other specified disorders of bladder: Secondary | ICD-10-CM | POA: Diagnosis present

## 2020-01-11 DIAGNOSIS — Z9049 Acquired absence of other specified parts of digestive tract: Secondary | ICD-10-CM | POA: Diagnosis not present

## 2020-01-11 DIAGNOSIS — Z79890 Hormone replacement therapy (postmenopausal): Secondary | ICD-10-CM | POA: Diagnosis not present

## 2020-01-11 DIAGNOSIS — G8929 Other chronic pain: Secondary | ICD-10-CM | POA: Diagnosis present

## 2020-01-11 DIAGNOSIS — M545 Low back pain: Secondary | ICD-10-CM | POA: Diagnosis present

## 2020-01-11 DIAGNOSIS — Z20822 Contact with and (suspected) exposure to covid-19: Secondary | ICD-10-CM | POA: Diagnosis present

## 2020-01-11 DIAGNOSIS — E785 Hyperlipidemia, unspecified: Secondary | ICD-10-CM | POA: Diagnosis present

## 2020-01-11 DIAGNOSIS — F4001 Agoraphobia with panic disorder: Secondary | ICD-10-CM | POA: Diagnosis present

## 2020-01-11 DIAGNOSIS — E876 Hypokalemia: Secondary | ICD-10-CM | POA: Diagnosis present

## 2020-01-11 DIAGNOSIS — R509 Fever, unspecified: Secondary | ICD-10-CM | POA: Diagnosis not present

## 2020-01-11 DIAGNOSIS — K5732 Diverticulitis of large intestine without perforation or abscess without bleeding: Secondary | ICD-10-CM | POA: Diagnosis not present

## 2020-01-11 DIAGNOSIS — E039 Hypothyroidism, unspecified: Secondary | ICD-10-CM | POA: Diagnosis present

## 2020-01-11 DIAGNOSIS — I10 Essential (primary) hypertension: Secondary | ICD-10-CM | POA: Diagnosis present

## 2020-01-11 LAB — COMPREHENSIVE METABOLIC PANEL
ALT: 23 U/L (ref 0–44)
AST: 25 U/L (ref 15–41)
Albumin: 3.4 g/dL — ABNORMAL LOW (ref 3.5–5.0)
Alkaline Phosphatase: 144 U/L — ABNORMAL HIGH (ref 38–126)
Anion gap: 9 (ref 5–15)
BUN: 8 mg/dL (ref 8–23)
CO2: 25 mmol/L (ref 22–32)
Calcium: 8.5 mg/dL — ABNORMAL LOW (ref 8.9–10.3)
Chloride: 99 mmol/L (ref 98–111)
Creatinine, Ser: 0.76 mg/dL (ref 0.44–1.00)
GFR calc Af Amer: >60 mL/min (ref >60)
GFR calc non Af Amer: >60 mL/min (ref >60)
Glucose, Bld: 109 mg/dL — ABNORMAL HIGH (ref 70–99)
Potassium: 3 mmol/L — ABNORMAL LOW (ref 3.5–5.1)
Sodium: 133 mmol/L — ABNORMAL LOW (ref 135–145)
Total Bilirubin: 0.9 mg/dL (ref 0.3–1.2)
Total Protein: 7.6 g/dL (ref 6.5–8.1)

## 2020-01-11 LAB — URINALYSIS, ROUTINE W REFLEX MICROSCOPIC
Bacteria, UA: NONE SEEN
Bilirubin Urine: NEGATIVE
Glucose, UA: NEGATIVE mg/dL
Ketones, ur: NEGATIVE mg/dL
Leukocytes,Ua: NEGATIVE
Nitrite: NEGATIVE
Protein, ur: NEGATIVE mg/dL
Specific Gravity, Urine: 1.011 (ref 1.005–1.030)
pH: 5 (ref 5.0–8.0)

## 2020-01-11 LAB — CBC WITH DIFFERENTIAL/PLATELET
Abs Immature Granulocytes: 0.03 10*3/uL (ref 0.00–0.07)
Basophils Absolute: 0.1 10*3/uL (ref 0.0–0.1)
Basophils Relative: 1 %
Eosinophils Absolute: 0.1 10*3/uL (ref 0.0–0.5)
Eosinophils Relative: 1 %
HCT: 36.7 % (ref 36.0–46.0)
Hemoglobin: 11.8 g/dL — ABNORMAL LOW (ref 12.0–15.0)
Immature Granulocytes: 0 %
Lymphocytes Relative: 16 %
Lymphs Abs: 1.7 10*3/uL (ref 0.7–4.0)
MCH: 32.2 pg (ref 26.0–34.0)
MCHC: 32.2 g/dL (ref 30.0–36.0)
MCV: 100.3 fL — ABNORMAL HIGH (ref 80.0–100.0)
Monocytes Absolute: 1.1 10*3/uL — ABNORMAL HIGH (ref 0.1–1.0)
Monocytes Relative: 10 %
Neutro Abs: 7.8 10*3/uL — ABNORMAL HIGH (ref 1.7–7.7)
Neutrophils Relative %: 72 %
Platelets: 353 10*3/uL (ref 150–400)
RBC: 3.66 MIL/uL — ABNORMAL LOW (ref 3.87–5.11)
RDW: 12.7 % (ref 11.5–15.5)
WBC: 10.8 10*3/uL — ABNORMAL HIGH (ref 4.0–10.5)
nRBC: 0 % (ref 0.0–0.2)

## 2020-01-11 LAB — PROTIME-INR
INR: 1.1 (ref 0.8–1.2)
Prothrombin Time: 14.5 seconds (ref 11.4–15.2)

## 2020-01-11 LAB — LIPASE, BLOOD: Lipase: 24 U/L (ref 11–51)

## 2020-01-11 LAB — APTT: aPTT: 35 seconds (ref 24–36)

## 2020-01-11 LAB — SARS CORONAVIRUS 2 (TAT 6-24 HRS): SARS Coronavirus 2: NEGATIVE

## 2020-01-11 LAB — LACTIC ACID, PLASMA
Lactic Acid, Venous: 0.8 mmol/L (ref 0.5–1.9)
Lactic Acid, Venous: 1.1 mmol/L (ref 0.5–1.9)

## 2020-01-11 LAB — TSH: TSH: 4.389 u[IU]/mL (ref 0.350–4.500)

## 2020-01-11 LAB — MAGNESIUM: Magnesium: 1.9 mg/dL (ref 1.7–2.4)

## 2020-01-11 MED ORDER — ALPRAZOLAM 1 MG PO TABS
2.0000 mg | ORAL_TABLET | Freq: Every day | ORAL | Status: DC
  Administered 2020-01-11 – 2020-01-13 (×3): 2 mg via ORAL
  Filled 2020-01-11 (×3): qty 2

## 2020-01-11 MED ORDER — SODIUM CHLORIDE 0.9 % IV SOLN
2.0000 g | Freq: Every day | INTRAVENOUS | Status: DC
  Administered 2020-01-11: 2 g via INTRAVENOUS
  Filled 2020-01-11: qty 20

## 2020-01-11 MED ORDER — POTASSIUM CHLORIDE 2 MEQ/ML IV SOLN
INTRAVENOUS | Status: AC
  Filled 2020-01-11: qty 1000

## 2020-01-11 MED ORDER — METOPROLOL SUCCINATE ER 25 MG PO TB24
12.5000 mg | ORAL_TABLET | Freq: Every day | ORAL | Status: DC
  Administered 2020-01-11 – 2020-01-13 (×3): 12.5 mg via ORAL
  Filled 2020-01-11 (×3): qty 1

## 2020-01-11 MED ORDER — DOCUSATE SODIUM 100 MG PO CAPS
100.0000 mg | ORAL_CAPSULE | Freq: Two times a day (BID) | ORAL | Status: DC
  Administered 2020-01-11 – 2020-01-13 (×4): 100 mg via ORAL
  Filled 2020-01-11 (×5): qty 1

## 2020-01-11 MED ORDER — OXYCODONE-ACETAMINOPHEN 5-325 MG PO TABS
2.0000 | ORAL_TABLET | Freq: Four times a day (QID) | ORAL | Status: DC | PRN
  Administered 2020-01-11 – 2020-01-14 (×4): 2 via ORAL
  Filled 2020-01-11 (×5): qty 2

## 2020-01-11 MED ORDER — ONDANSETRON HCL 4 MG PO TABS: 4.0000 mg | ORAL_TABLET | Freq: Four times a day (QID) | ORAL | Status: DC | PRN

## 2020-01-11 MED ORDER — ONDANSETRON HCL 4 MG/2ML IJ SOLN: 4.0000 mg | Freq: Four times a day (QID) | INTRAMUSCULAR | Status: DC | PRN

## 2020-01-11 MED ORDER — SODIUM CHLORIDE 0.9 % IV SOLN
500.0000 mg | Freq: Every day | INTRAVENOUS | Status: DC
  Administered 2020-01-11: 500 mg via INTRAVENOUS
  Filled 2020-01-11: qty 500

## 2020-01-11 MED ORDER — PIPERACILLIN-TAZOBACTAM 3.375 G IVPB 30 MIN
3.3750 g | Freq: Once | INTRAVENOUS | Status: AC
  Administered 2020-01-11: 3.375 g via INTRAVENOUS
  Filled 2020-01-11: qty 50

## 2020-01-11 MED ORDER — ACETAMINOPHEN 325 MG PO TABS
650.0000 mg | ORAL_TABLET | Freq: Once | ORAL | Status: AC
  Administered 2020-01-11: 650 mg via ORAL
  Filled 2020-01-11: qty 2

## 2020-01-11 MED ORDER — ROSUVASTATIN CALCIUM 20 MG PO TABS
20.0000 mg | ORAL_TABLET | Freq: Every day | ORAL | Status: DC
  Administered 2020-01-11 – 2020-01-13 (×3): 20 mg via ORAL
  Filled 2020-01-11 (×3): qty 1

## 2020-01-11 MED ORDER — ENOXAPARIN SODIUM 40 MG/0.4ML ~~LOC~~ SOLN
40.0000 mg | SUBCUTANEOUS | Status: DC
  Administered 2020-01-13: 40 mg via SUBCUTANEOUS
  Filled 2020-01-11: qty 0.4

## 2020-01-11 MED ORDER — IOHEXOL 300 MG/ML  SOLN
100.0000 mL | Freq: Once | INTRAMUSCULAR | Status: AC | PRN
  Administered 2020-01-11: 100 mL via INTRAVENOUS

## 2020-01-11 MED ORDER — POTASSIUM CHLORIDE CRYS ER 20 MEQ PO TBCR
40.0000 meq | EXTENDED_RELEASE_TABLET | Freq: Once | ORAL | Status: AC
  Administered 2020-01-11: 40 meq via ORAL
  Filled 2020-01-11: qty 2

## 2020-01-11 MED ORDER — ACETAMINOPHEN 325 MG PO TABS: 650.0000 mg | ORAL_TABLET | Freq: Four times a day (QID) | ORAL | Status: DC | PRN

## 2020-01-11 MED ORDER — PIPERACILLIN-TAZOBACTAM 3.375 G IVPB
3.3750 g | Freq: Three times a day (TID) | INTRAVENOUS | Status: DC
  Administered 2020-01-11 – 2020-01-14 (×8): 3.375 g via INTRAVENOUS
  Filled 2020-01-11 (×8): qty 50

## 2020-01-11 MED ORDER — LEVOTHYROXINE SODIUM 25 MCG PO TABS
25.0000 ug | ORAL_TABLET | Freq: Every day | ORAL | Status: DC
  Administered 2020-01-12 – 2020-01-14 (×2): 25 ug via ORAL
  Filled 2020-01-11 (×2): qty 1

## 2020-01-11 MED ORDER — ALPRAZOLAM 1 MG PO TABS
1.0000 mg | ORAL_TABLET | Freq: Two times a day (BID) | ORAL | Status: DC
  Administered 2020-01-11 – 2020-01-14 (×6): 1 mg via ORAL
  Filled 2020-01-11 (×6): qty 1

## 2020-01-11 MED ORDER — ACETAMINOPHEN 650 MG RE SUPP: 650.0000 mg | Freq: Four times a day (QID) | RECTAL | Status: DC | PRN

## 2020-01-11 NOTE — Progress Notes (Signed)
Pharmacy Antibiotic Note  Alexa Wilson is a 74 y.o. female admitted on 01/11/2020 with intra-abdominal infection.  Pharmacy has been consulted for Zosyn dosing.  Plan: Zosyn 3.375g IV q8h (4 hour infusion).  Monitor labs, c/s, and patient improvement.  Height: 5\' 6"  (167.6 cm) Weight: 193 lb (87.5 kg) IBW/kg (Calculated) : 59.3  Temp (24hrs), Avg:99.5 F (37.5 C), Min:98.5 F (36.9 C), Max:101.2 F (38.4 C)  Recent Labs  Lab 01/06/20 1944 01/11/20 0048 01/11/20 0049 01/11/20 0308  WBC 11.2*  --  10.8*  --   CREATININE 0.89  --  0.76  --   LATICACIDVEN  --  0.8  --  1.1    Estimated Creatinine Clearance: 70.8 mL/min (by C-G formula based on SCr of 0.76 mg/dL).    Allergies  Allergen Reactions  . Ciprofloxacin Nausea Only  . Codeine Other (See Comments)    Increases anxiety, Makes me feel all whoozie  . Metronidazole Nausea Only  . Morphine And Related Nausea And Vomiting  . Other Other (See Comments)    All antihistamines cause severe anxiety    Antimicrobials this admission: Zosyn 3/11 >>     Dose adjustments this admission: N/A  Microbiology results: 3/11 BCx: pending 3/11 UCx: pending    Thank you for allowing pharmacy to be a part of this patient's care.  Margot Ables, PharmD Clinical Pharmacist 01/11/2020 11:26 AM

## 2020-01-11 NOTE — Plan of Care (Signed)

## 2020-01-11 NOTE — ED Provider Notes (Signed)
[facility] EMERGENCY DEPARTMENT Provider Note   CSN: [id number] Arrival date & time: 01/10/20  2343     History Chief Complaint  Patient presents with  . Abdominal Pain    Alexa Wilson is a 74 y.o. female.  The history is provided by the patient.  Abdominal Pain She was seen in the emergency department 3 days ago and diagnosed with diverticulitis.  She was discharged with prescription for Augmentin.  The abdominal pain which is related to the diverticulitis does seem to be improving.  However, she has noted bladder spasms and noticing that she is only urinating a small amount of time.  She feels like she constantly has to go.  Abdominal pain is worse when she walks.  She denies nausea or vomiting.  She has not been aware of any fever, chills, sweats.     Past Medical History:  Diagnosis Date  . Agoraphobia with panic disorder 1982  . Brown recluse spider bite   . Constipation   . Diverticulitis   . GERD (gastroesophageal reflux disease)   . Hyperlipidemia   . Lyme disease   . Migraines    "don't have them since menopause" (09/23/2016)  . Panic disorder   . Shingles     Patient Active Problem List   Diagnosis Date Noted  . Diverticulitis 09/22/2016  . Acute hypokalemia 09/22/2016  . Hyperlipidemia 11/15/2015  . Constipation 04/09/2015  . Palpitations 08/11/2013  . Chest pressure 07/15/2013  . Right arm weakness 07/15/2013  . Sigmoid diverticulitis 07/14/2012  . DYSPEPSIA 09/06/2009  . IBS 09/06/2009  . COLONIC POLYPS, HX OF 09/06/2009  . WEIGHT LOSS 06/07/2009  . NAUSEA AND VOMITING 06/07/2009  . Abdominal pain 06/07/2009  . EPIGASTRIC PAIN 06/07/2009  . COLONIC POLYPS 02/21/2009  . DYSPHAGIA 02/21/2009  . PANIC DISORDER 02/20/2009  . CATARACTS 02/20/2009  . DIVERTICULOSIS OF COLON 02/20/2009  . HELICOBACTER PYLORI GASTRITIS, HX OF 02/20/2009    Past Surgical History:  Procedure Laterality Date  . APPENDECTOMY  1964  . CHOLECYSTECTOMY OPEN  1966    . COLONOSCOPY  2012   RMR: 1. External hemorrhoids, otherwise normal rectum. 2. Left-sided diverticula status post snare polypectomy left colon polyp. Remainder of the colonic mucosa and terminal ileum mucoa appeared normal.   . COLONOSCOPY N/A 02/11/2017   Procedure: COLONOSCOPY;  Surgeon: Daneil Dolin, MD;  Location: [facility];  Service: Endoscopy;  Laterality: N/A;  1200  . DILATION AND CURETTAGE OF UTERUS    . TONSILLECTOMY  1961     OB History   No obstetric history on file.     Family History  Problem Relation Age of Onset  . Diabetes Mother   . Hypertension Mother   . Heart attack Mother   . Cancer Father        Melanoma   . Diabetes Brother   . Diabetes Son   . Colon cancer Neg Hx     Social History   Tobacco Use  . Smoking status: Never Smoker  . Smokeless tobacco: Never Used  Substance Use Topics  . Alcohol use: No    Alcohol/week: 0.0 standard drinks  . Drug use: No    Home Medications Prior to Admission medications   Medication Sig Start Date End Date Taking? Authorizing Provider  alprazolam Duanne Moron) 2 MG tablet Take 1-2 mg by mouth See admin instructions. Take 1/2 tablet 8 am and 8 pm then take 1 tablet at 2 pm    [provider]  amoxicillin-clavulanate (AUGMENTIN) 500-125 MG tablet Take 1 tablet by mouth 2 (two) times daily. 01/06/20   [provider]  docusate sodium (STOOL SOFTENER) 100 MG capsule Take 1 capsule (100 mg total) by mouth 2 (two) times daily. Patient not taking: Reported on 01/07/2020 06/07/18   Shelly Coss, MD  levothyroxine (SYNTHROID) 25 MCG tablet Take 25 mcg by mouth daily. 12/11/19   [provider]  metoprolol succinate (TOPROL-XL) 25 MG 24 hr tablet Take 0.5 tablets (12.5 mg total) by mouth daily. Patient taking differently: Take 12.5 mg by mouth at bedtime.  12/04/19   Lelon Perla, MD  oxyCODONE-acetaminophen (PERCOCET/ROXICET) 5-325 MG tablet Take 2 tablets by mouth every 6 (six) hours as needed  for severe pain. 01/07/20   Horton, Barbette Hair, MD  phenazopyridine (PYRIDIUM) 100 MG tablet Take 2 tablets (200 mg total) by mouth 3 (three) times daily as needed for pain. 01/07/20   Horton, Barbette Hair, MD  rosuvastatin (CRESTOR) 10 MG tablet Take 10 mg by mouth at bedtime. 11/10/19   [provider]    Allergies    Ciprofloxacin, Codeine, Metronidazole, Morphine and related, and Other  Review of Systems   Review of Systems  Gastrointestinal: Positive for abdominal pain.  All other systems reviewed and are negative.   Physical Exam Updated Vital Signs BP (!) 147/84   Pulse (!) 113   Temp (!) 101.2 F (38.4 C) (Oral)   Resp 18   Ht 5\' 6"  (1.676 m)   Wt 87.5 kg   SpO2 98%   BMI 31.15 kg/m   Physical Exam Vitals and nursing note reviewed.   74 year old female, resting comfortably and in no acute distress. Vital signs are significant for elevated temperature, heart rate, blood pressure. Oxygen saturation is 98%, which is normal. Head is normocephalic and atraumatic. PERRLA, EOMI. Oropharynx is clear. Neck is nontender and supple without adenopathy or JVD. Back is nontender and there is no CVA tenderness. Lungs are clear without rales, wheezes, or rhonchi. Chest is nontender. Heart has regular rate and rhythm without murmur. Abdomen is soft, flat, with moderate suprapubic and left lower quadrant tenderness.  Bladder is slightly distended, but there are no other masses or hepatosplenomegaly and peristalsis is hypoactive. Extremities have no cyanosis or edema, full range of motion is present. Skin is warm and dry without rash. Neurologic: Mental status is normal, cranial nerves are intact, there are no motor or sensory deficits.  ED Results / Procedures / Treatments   Labs (all labs ordered are listed, but only abnormal results are displayed) Labs Reviewed - No data to display  EKG None  Radiology No results found.  Procedures Procedures (including critical care  time)  Medications Ordered in ED Medications - No data to display  ED Course  I have reviewed the triage vital signs and the nursing notes.  Pertinent labs & imaging results that were available during my care of the patient were reviewed by me and considered in my medical decision making (see chart for details).  MDM Rules/Calculators/A&P Abdominal pain in patient with recent diagnosis of diverticulitis.  Old records reviewed confirming the CT scan on 3/7 showing diverticulitis and treatment with Augmentin.  Current symptoms could be worsening of diverticulitis, possible new urinary tract infection.  Will check screening labs and get cultures.  Labs are reassuring although there has been a modest drop of hemoglobin.  WBC has decreased compared with 3/6.  She does have hypokalemia and is given a dose of  oral potassium.  Urinalysis shows no evidence of infection.  Chest x-ray shows possible area of atelectasis versus pneumonia in the left base.  Given fever in spite of several days of treatment of diverticulitis, will repeat CT scan to see if there is any sign of perforation or abscess formation.  CT does show 1 area of diverticulitis has improved and the other has formed an abscess.  Antibiotic is changed to Zosyn.  Case is discussed with Dr. Humphrey Rolls of [facility], who agrees to admit the patient.  Final Clinical Impression(s) / ED Diagnoses Final diagnoses:  Diverticulitis of large intestine with abscess without bleeding    Rx / DC Orders ED Discharge Orders    None       Alexa Fuel, MD [id number] [phone]

## 2020-01-11 NOTE — H&P (Addendum)
History and Physical    Alexa Wilson UKG:[id number] DOB: [birth year] DOA: 01/11/2020  PCP: Ignatius Specking, MD   Patient coming from: Home  Chief Complaint: Worsening abdominal and suprapubic pain with urinary frequency  HPI: Alexa Wilson is a 74 y.o. female with medical history significant for panic disorder, GERD, dyslipidemia, hypertension, and hypothyroidism who was started on Augmentin for suspected diverticulitis by her PCP around 3/5.  Alexa Wilson began to have some worsening pain for which Alexa Wilson came to the ED on 3/7 and was discharged home with plans to continue the same along with Pyridium for bladder spasms.  Unfortunately, her abdominal pain continued to worsen according to her son and Alexa Wilson has been having urinary urgency with small amounts of voiding.  The patient seems to think that her abdominal pain is improving, however Alexa Wilson denies any diarrhea and actually states Alexa Wilson has not had a bowel movement in the last 3 days.  No nausea or vomiting reported, nor other fevers or chills.  Alexa Wilson appears to be mildly confused and is a difficult historian.   ED Course: Vital signs are stable and EKG demonstrates a mild sinus tachycardia.  Potassium is 3.  Urine analysis is unremarkable and lactic acid is 1.1.  Mild leukocytosis of 10,800 noted.  CT of the abdomen and pelvis demonstrates sigmoid diverticulitis with improvement proximally, but worsening progression distally with 3.5 cm pericolonic abscesses noted.  Chest x-ray with no acute findings.  Alexa Wilson has been given some Zosyn.  Review of Systems: All others reviewed as noted above and otherwise negative.  Past Medical History:  Diagnosis Date  . Agoraphobia with panic disorder 1982  . Brown recluse spider bite   . Constipation   . Diverticulitis   . GERD (gastroesophageal reflux disease)   . Hyperlipidemia   . Lyme disease   . Migraines    "don't have them since menopause" (09/23/2016)  . Panic disorder   . Shingles     Past Surgical  History:  Procedure Laterality Date  . APPENDECTOMY  1964  . CHOLECYSTECTOMY OPEN  1966  . COLONOSCOPY  2012   RMR: 1. External hemorrhoids, otherwise normal rectum. 2. Left-sided diverticula status post snare polypectomy left colon polyp. Remainder of the colonic mucosa and terminal ileum mucoa appeared normal.   . COLONOSCOPY N/A 02/11/2017   Procedure: COLONOSCOPY;  Surgeon: Corbin Ade, MD;  Location: [facility];  Service: Endoscopy;  Laterality: N/A;  1200  . DILATION AND CURETTAGE OF UTERUS    . TONSILLECTOMY  1961     reports that Alexa Wilson has never smoked. Alexa Wilson has never used smokeless tobacco. Alexa Wilson reports that Alexa Wilson does not drink alcohol or use drugs.  Allergies  Allergen Reactions  . Ciprofloxacin Nausea Only  . Codeine Other (See Comments)    Increases anxiety, Makes me feel all whoozie  . Metronidazole Nausea Only  . Morphine And Related Nausea And Vomiting  . Other Other (See Comments)    All antihistamines cause severe anxiety    Family History  Problem Relation Age of Onset  . Diabetes Mother   . Hypertension Mother   . Heart attack Mother   . Cancer Father        Melanoma   . Diabetes Brother   . Diabetes Son   . Colon cancer Neg Hx     Prior to Admission medications   Medication Sig Start Date End Date Taking? Authorizing Provider  acetaminophen (TYLENOL) 500 MG tablet Take 500 mg by  mouth every 6 (six) hours as needed for mild pain or moderate pain.   Yes [provider]  alprazolam Prudy Feeler) 2 MG tablet Take 1-2 mg by mouth every 8 (eight) hours. Take 1/2 tablet 8 am and 8 pm then take 1 tablet at 2 pm   Yes [provider]  amoxicillin-clavulanate (AUGMENTIN) 500-125 MG tablet Take 1 tablet by mouth 2 (two) times daily. 01/06/20  Yes [provider]  docusate sodium (STOOL SOFTENER) 100 MG capsule Take 1 capsule (100 mg total) by mouth 2 (two) times daily. 06/07/18  Yes Burnadette Pop, MD  levothyroxine (SYNTHROID) 25 MCG tablet Take  25 mcg by mouth daily. 12/11/19  Yes [provider]  metoprolol succinate (TOPROL-XL) 25 MG 24 hr tablet Take 0.5 tablets (12.5 mg total) by mouth daily. Patient taking differently: Take 12.5 mg by mouth at bedtime.  12/04/19  Yes Lewayne Bunting, MD  oxyCODONE-acetaminophen (PERCOCET/ROXICET) 5-325 MG tablet Take 2 tablets by mouth every 6 (six) hours as needed for severe pain. 01/07/20  Yes Horton, Mayer Masker, MD  phenazopyridine (PYRIDIUM) 100 MG tablet Take 2 tablets (200 mg total) by mouth 3 (three) times daily as needed for pain. 01/07/20  Yes Horton, Mayer Masker, MD  rosuvastatin (CRESTOR) 10 MG tablet Take 20 mg by mouth at bedtime.  11/10/19  Yes [provider]    Physical Exam: Vitals:   01/11/20 0630 01/11/20 0700 01/11/20 0832 01/11/20 0904  BP: (!) 100/59 (!) 109/53 135/67   Pulse: 81 77 85 79  Resp: 19 19 18    Temp:   98.5 F (36.9 C)   TempSrc:   Oral   SpO2: 93% 94% 97% 97%  Weight:      Height:        Constitutional: NAD, calm, comfortable Vitals:   01/11/20 0630 01/11/20 0700 01/11/20 0832 01/11/20 0904  BP: (!) 100/59 (!) 109/53 135/67   Pulse: 81 77 85 79  Resp: 19 19 18    Temp:   98.5 F (36.9 C)   TempSrc:   Oral   SpO2: 93% 94% 97% 97%  Weight:      Height:       Eyes: lids and conjunctivae normal ENMT: Mucous membranes are moist.  Neck: normal, supple Respiratory: clear to auscultation bilaterally. Normal respiratory effort. No accessory muscle use.  Cardiovascular: Regular rate and rhythm, no murmurs. No extremity edema. Abdomen: no tenderness, no distention. Bowel sounds positive.  No guarding, rigidity, or rebound noted Musculoskeletal:  No joint deformity upper and lower extremities.   Skin: no rashes, lesions, ulcers.  Psychiatric: Flat affect with some confusion  Labs on Admission: I have personally reviewed following labs and imaging studies  CBC: Recent Labs  Lab 01/06/20 1944 01/11/20 0049  WBC 11.2* 10.8*  NEUTROABS  --   7.8*  HGB 13.6 11.8*  HCT 41.5 36.7  MCV 99.3 100.3*  PLT 341 353   Basic Metabolic Panel: Recent Labs  Lab 01/06/20 1944 01/11/20 0049 01/11/20 0735  NA 138 133*  --   K 3.5 3.0*  --   CL 104 99  --   CO2 24 25  --   GLUCOSE 118* 109*  --   BUN 10 8  --   CREATININE 0.89 0.76  --   CALCIUM 9.4 8.5*  --   MG  --   --  1.9   GFR: Estimated Creatinine Clearance: 70.8 mL/min (by C-G formula based on SCr of 0.76 mg/dL). Liver Function Tests:  Recent Labs  Lab 01/06/20 1944 01/11/20 0049  AST 38 25  ALT 33 23  ALKPHOS 124 144*  BILITOT 1.5* 0.9  PROT 7.4 7.6  ALBUMIN 3.8 3.4*   Recent Labs  Lab 01/06/20 1944 01/11/20 0049  LIPASE 27 24   No results for input(s): AMMONIA in the last 168 hours. Coagulation Profile: Recent Labs  Lab 01/11/20 0049  INR 1.1   Cardiac Enzymes: No results for input(s): CKTOTAL, CKMB, CKMBINDEX, TROPONINI in the last 168 hours. BNP (last 3 results) No results for input(s): PROBNP in the last 8760 hours. HbA1C: No results for input(s): HGBA1C in the last 72 hours. CBG: No results for input(s): GLUCAP in the last 168 hours. Lipid Profile: No results for input(s): CHOL, HDL, LDLCALC, TRIG, CHOLHDL, LDLDIRECT in the last 72 hours. Thyroid Function Tests: No results for input(s): TSH, T4TOTAL, FREET4, T3FREE, THYROIDAB in the last 72 hours. Anemia Panel: No results for input(s): VITAMINB12, FOLATE, FERRITIN, TIBC, IRON, RETICCTPCT in the last 72 hours. Urine analysis:    Component Value Date/Time   COLORURINE YELLOW 01/11/2020 0135   APPEARANCEUR CLEAR 01/11/2020 0135   LABSPEC 1.011 01/11/2020 0135   PHURINE 5.0 01/11/2020 0135   GLUCOSEU NEGATIVE 01/11/2020 0135   HGBUR SMALL (A) 01/11/2020 0135   BILIRUBINUR NEGATIVE 01/11/2020 0135   KETONESUR NEGATIVE 01/11/2020 0135   PROTEINUR NEGATIVE 01/11/2020 0135   UROBILINOGEN 0.2 02/19/2014 0850   NITRITE NEGATIVE 01/11/2020 0135   LEUKOCYTESUR NEGATIVE 01/11/2020 0135     Radiological Exams on Admission: CT ABDOMEN PELVIS W CONTRAST  Result Date: 01/11/2020 CLINICAL DATA:  Abdominal pain for 2 weeks with bladder spasms, suspected diverticulitis. EXAM: CT ABDOMEN AND PELVIS WITH CONTRAST TECHNIQUE: Multidetector CT imaging of the abdomen and pelvis was performed using the standard protocol following bolus administration of intravenous contrast. CONTRAST:  OMNIPAQUE IOHEXOL 300 MG/ML  SOLN COMPARISON:  Four days ago FINDINGS: Lower chest: Atelectatic type opacity in the left lower lobe. Small sliding hiatal hernia Hepatobiliary: No focal liver abnormality.Cholecystectomy. Pancreas: Unremarkable. Spleen: Unremarkable. Adrenals/Urinary Tract: Negative adrenals. No hydronephrosis or stone. Unremarkable bladder. Stomach/Bowel: 2 areas of sigmoid diverticulitis were seen previously. The more proximal has improved and the more distal has progressed. Distally there is now a gas and fluid collection spanning the wall and pericolonic fat, measuring up to 3.5 cm. Regional inflammation has also increased at the distal site. No free pneumoperitoneum. No bowel obstruction Vascular/Lymphatic: No acute vascular abnormality. Atherosclerotic calcifications no mass or adenopathy. Reproductive:No pathologic findings. Other: No ascites or pneumoperitoneum. Musculoskeletal: No acute abnormalities. IMPRESSION: Two sites of sigmoid diverticulitis seen on recent abdominal CT. The more proximal is improved and the more distal is progressed. At the progressed site there is now a 3.5 cm intramural to pericolonic abscess. Electronically Signed   By: Marnee Spring M.D.   On: 01/11/2020 05:45   DG Chest Port 1 View  Result Date: 01/11/2020 CLINICAL DATA:  Fever EXAM: PORTABLE CHEST 1 VIEW COMPARISON:  CT of the pelvis dated January 07, 2020 FINDINGS: There is an airspace opacity at the left lung base. This is favored to represent an area of atelectasis as seen on the patient's prior CT. The heart  size is normal. Aortic calcifications are noted. There is no pneumothorax. No significant pleural effusion. IMPRESSION: No active disease. Electronically Signed   By: Katherine Mantle M.D.   On: 01/11/2020 00:56    EKG: Independently reviewed. ST 104bpm.  Assessment/Plan Active Problems:   Diverticulitis of intestine with abscess  Complicated sigmoid diverticulitis with distal intramural/pericolonic abscess -Continue gentle IV fluid as well as Zosyn -Appreciate general surgery evaluation -Soft diet for now -No signs of peritonitis noted -Follow a.m. labs  Hypokalemia -Patient given oral supplementation and magnesium is within normal limits -We will add a little bit more potassium in IV fluid -Repeat studies in a.m.  Hypothyroidism -Continue Synthroid and check TSH  Hypertension -Continue metoprolol and monitor  Dyslipidemia -Continue statin  GERD -PPI daily  Panic disorder -Xanax as needed   DVT prophylaxis: Lovenox Code Status: Full Family Communication: Discussed with son on phone Disposition Plan: Admit for treatment of complicated diverticulitis with IV Zosyn and general surgery evaluation Consults called: General surgery Admission status: Inpatient, MedSurg   Milos Milligan D Bryanne Riquelme DO [facility]  If 7PM-7AM, please contact night-coverage [email or web address]  01/11/2020, 11:27 AM

## 2020-01-11 NOTE — ED Triage Notes (Signed)
Pt c/o abdominal pain x 2 weeks and bladder spasms. Seen 01/06/20 for same. HX of diverticulitis. Pt currently taking augmentin with no relief.

## 2020-01-11 NOTE — Consult Note (Signed)
[place]  Reason for Consult: Diverticulitis Referring Physician: Dr. Humphrey Rolls  Chief Complaint    Abdominal Pain      HPI: Alexa Wilson is a 74 y.o. female with a past medical history significant for GERD, dyslipidemia, hypertension, panic disorder, and hypothyroidism who presented with worsening LLQ abdominal pain.  The LLQ abdominal pain started on 3/5 to which she contacted her PCP who prescribed Augmentin for suspected diverticulosis. The patient began experiencing worsening pain and came into the emergency department on 3/7 with additional bladder spasms and discharged home with Augmentin and Pyridium for bladder spasms. Despite treatment, patient's pain did not subside, and she came back to the hospital this morning. Patient states the abdominal pain was a 10/10 on 3/5 and is now a 4/10. Pain is not constant in onset or duration, without noticeable aggravating/alleviating agents. Patient states she initially experienced fever and chills, which have since subsided. She denies any nausea or vomiting. Patient has had 2 bowel movements today which she reports were soft and associated with a intense, sharp pain with defecation. No diarrhea, or blood in the stool.   Past Medical History:  Diagnosis Date  . Agoraphobia with panic disorder 1982  . Brown recluse spider bite   . Constipation   . Diverticulitis   . GERD (gastroesophageal reflux disease)   . Hyperlipidemia   . Lyme disease   . Migraines    "don't have them since menopause" (09/23/2016)  . Panic disorder   . Shingles     Past Surgical History:  Procedure Laterality Date  . APPENDECTOMY  1964  . CHOLECYSTECTOMY OPEN  1966  . COLONOSCOPY  2012   RMR: 1. External hemorrhoids, otherwise normal rectum. 2. Left-sided diverticula status post snare polypectomy left colon polyp. Remainder of the colonic mucosa and terminal ileum mucoa appeared normal.   . COLONOSCOPY N/A 02/11/2017   Procedure:  COLONOSCOPY;  Surgeon: Daneil Dolin, MD;  Location: [facility];  Service: Endoscopy;  Laterality: N/A;  1200  . DILATION AND CURETTAGE OF UTERUS    . TONSILLECTOMY  1961    Family History  Problem Relation Age of Onset  . Diabetes Mother   . Hypertension Mother   . Heart attack Mother   . Cancer Father        Melanoma   . Diabetes Brother   . Diabetes Son   . Colon cancer Neg Hx     Social History   Tobacco Use  . Smoking status: Never Smoker  . Smokeless tobacco: Never Used  Substance Use Topics  . Alcohol use: No    Alcohol/week: 0.0 standard drinks  . Drug use: No    Medications: I have reviewed the patient's current medications.  Allergies  Allergen Reactions  . Ciprofloxacin Nausea Only  . Codeine Other (See Comments)    Increases anxiety, Makes me feel all whoozie  . Metronidazole Nausea Only  . Morphine And Related Nausea And Vomiting  . Other Other (See Comments)    All antihistamines cause severe anxiety     ROS:  Pertinent items are noted in HPI.  Blood pressure 135/67, pulse 79, temperature 98.5 F (36.9 C), temperature source Oral, resp. rate 18, height 5\' 6"  (1.676 m), weight 87.5 kg, SpO2 97 %. Physical Exam: General: NAD, alert, cooperative. Cardiovascular: RRR, S1, S2 normal, no murmurs, rubs, clicks, or gallops. Respiratory: lungs clear to auscultation, bilaterally. Abdomen:  Bowel sounds present. No tenderness to palpation. No rebound, guarding, or rigidity.  Results: Results for orders placed or performed during the hospital encounter of 01/11/20 (from the past 48 hour(s))  Lactic acid, plasma     Status: None   Collection Time: 01/11/20 12:48 AM  Result Value Ref Range   Lactic Acid, Venous 0.8 0.5 - 1.9 mmol/L    Comment: Performed at [facility], [address] [place], [place] [address]  Blood Culture (routine x 2)     Status: None (Preliminary result)   Collection Time: 01/11/20 12:48 AM   Specimen: BLOOD  Result Value Ref  Range   Specimen Description BLOOD RIGHT ANTECUBITAL    Special Requests      BOTTLES DRAWN AEROBIC AND ANAEROBIC Blood Culture results may not be optimal due to an excessive volume of blood received in culture bottles   Culture      NO GROWTH < 12 HOURS Performed at [facility], [address], [place], [place] [address]    Report Status PENDING   Comprehensive metabolic panel     Status: Abnormal   Collection Time: 01/11/20 12:49 AM  Result Value Ref Range   Sodium 133 (L) 135 - 145 mmol/L   Potassium 3.0 (L) 3.5 - 5.1 mmol/L   Chloride 99 98 - 111 mmol/L   CO2 25 22 - 32 mmol/L   Glucose, Bld 109 (H) 70 - 99 mg/dL    Comment: Glucose reference range applies only to samples taken after fasting for at least 8 hours.   BUN 8 8 - 23 mg/dL   Creatinine, Ser 0.76 0.44 - 1.00 mg/dL   Calcium 8.5 (L) 8.9 - 10.3 mg/dL   Total Protein 7.6 6.5 - 8.1 g/dL   Albumin 3.4 (L) 3.5 - 5.0 g/dL   AST 25 15 - 41 U/L   ALT 23 0 - 44 U/L   Alkaline Phosphatase 144 (H) 38 - 126 U/L   Total Bilirubin 0.9 0.3 - 1.2 mg/dL   GFR calc non Af Amer >60 >60 mL/min   GFR calc Af Amer >60 >60 mL/min   Anion gap 9 5 - 15    Comment: Performed at [facility], [address] [place], [place] [address]  CBC WITH DIFFERENTIAL     Status: Abnormal   Collection Time: 01/11/20 12:49 AM  Result Value Ref Range   WBC 10.8 (H) 4.0 - 10.5 K/uL   RBC 3.66 (L) 3.87 - 5.11 MIL/uL   Hemoglobin 11.8 (L) 12.0 - 15.0 g/dL   HCT 36.7 36.0 - 46.0 %   MCV 100.3 (H) 80.0 - 100.0 fL   MCH 32.2 26.0 - 34.0 pg   MCHC 32.2 30.0 - 36.0 g/dL   RDW 12.7 11.5 - 15.5 %   Platelets 353 150 - 400 K/uL   nRBC 0.0 0.0 - 0.2 %   Neutrophils Relative % 72 %   Neutro Abs 7.8 (H) 1.7 - 7.7 K/uL   Lymphocytes Relative 16 %   Lymphs Abs 1.7 0.7 - 4.0 K/uL   Monocytes Relative 10 %   Monocytes Absolute 1.1 (H) 0.1 - 1.0 K/uL   Eosinophils Relative 1 %   Eosinophils Absolute 0.1 0.0 - 0.5 K/uL   Basophils Relative 1 %   Basophils  Absolute 0.1 0.0 - 0.1 K/uL   Immature Granulocytes 0 %   Abs Immature Granulocytes 0.03 0.00 - 0.07 K/uL    Comment: Performed at [facility], [address] [place], [place] [address]  APTT     Status: None   Collection  Time: 01/11/20 12:49 AM  Result Value Ref Range   aPTT 35 24 - 36 seconds    Comment: Performed at [facility], [address], [place], [place] [address]  Protime-INR     Status: None   Collection Time: 01/11/20 12:49 AM  Result Value Ref Range   Prothrombin Time 14.5 11.4 - 15.2 seconds   INR 1.1 0.8 - 1.2    Comment: (NOTE) INR goal varies based on device and disease states. Performed at [facility], [address] [place], [place] [address]   Lipase, blood     Status: None   Collection Time: 01/11/20 12:49 AM  Result Value Ref Range   Lipase 24 11 - 51 U/L    Comment: Performed at [facility], [address] [place], [place] [address]  TSH     Status: None   Collection Time: 01/11/20 12:49 AM  Result Value Ref Range   TSH 4.389 0.350 - 4.500 uIU/mL    Comment: Performed by a 3rd Generation assay with a functional sensitivity of <=0.01 uIU/mL. Performed at [facility], [address] [place], [place] [address]   Blood Culture (routine x 2)     Status: None (Preliminary result)   Collection Time: 01/11/20 12:50 AM   Specimen: BLOOD  Result Value Ref Range   Specimen Description BLOOD LEFT ANTECUBITAL    Special Requests      BOTTLES DRAWN AEROBIC AND ANAEROBIC Blood Culture results may not be optimal due to an excessive volume of blood received in culture bottles   Culture      NO GROWTH < 12 HOURS Performed at [facility], [address], [place], [place] [address]    Report Status PENDING   Urinalysis, Routine w reflex microscopic     Status: Abnormal   Collection Time: 01/11/20  1:35 AM  Result Value Ref Range   Color, Urine YELLOW YELLOW   APPearance CLEAR CLEAR   Specific Gravity, Urine 1.011 1.005 - 1.030   pH 5.0 5.0 - 8.0   Glucose, UA  NEGATIVE NEGATIVE mg/dL   Hgb urine dipstick SMALL (A) NEGATIVE   Bilirubin Urine NEGATIVE NEGATIVE   Ketones, ur NEGATIVE NEGATIVE mg/dL   Protein, ur NEGATIVE NEGATIVE mg/dL   Nitrite NEGATIVE NEGATIVE   Leukocytes,Ua NEGATIVE NEGATIVE   RBC / HPF 0-5 0 - 5 RBC/hpf   WBC, UA 0-5 0 - 5 WBC/hpf   Bacteria, UA NONE SEEN NONE SEEN   Squamous Epithelial / LPF 0-5 0 - 5   Mucus PRESENT     Comment: Performed at [facility], [address], [place], [place] [address]  Lactic acid, plasma     Status: None   Collection Time: 01/11/20  3:08 AM  Result Value Ref Range   Lactic Acid, Venous 1.1 0.5 - 1.9 mmol/L    Comment: Performed at [facility], [address] [place], [place] [address]  SARS CORONAVIRUS 2 (TAT 6-24 HRS) Nasopharyngeal Nasopharyngeal Swab     Status: None   Collection Time: 01/11/20  4:59 AM   Specimen: Nasopharyngeal Swab  Result Value Ref Range   SARS Coronavirus 2 NEGATIVE NEGATIVE    Comment: (NOTE) SARS-CoV-2 target nucleic acids are NOT DETECTED. The SARS-CoV-2 RNA is generally detectable in upper and lower respiratory specimens during the acute phase of infection. Negative results do not preclude SARS-CoV-2 infection, do not rule out co-infections with other pathogens, and should not be used as the sole basis for treatment or other patient management  decisions. Negative results must be combined with clinical observations, patient history, and epidemiological information. The expected result is Negative. Fact Sheet for Patients: SugarRoll.be Fact Sheet for Healthcare Providers: https://www.woods-mathews.com/ This test is not yet approved or cleared by the [place] FDA and  has been authorized for detection and/or diagnosis of SARS-CoV-2 by FDA under an Emergency Use Authorization (EUA). This EUA will remain  in effect (meaning this test can be used) for the duration of the COVID-19 declaration under Section 56  4(b)(1) of the Act, 21 U.S.C. section 360bbb-3(b)(1), unless the authorization is terminated or revoked sooner. Performed at [facility], [place] [address], [place], [place] [address]   Magnesium     Status: None   Collection Time: 01/11/20  7:35 AM  Result Value Ref Range   Magnesium 1.9 1.7 - 2.4 mg/dL    Comment: Performed at [facility], [address] [place], [place] [address]    CT ABDOMEN PELVIS W CONTRAST  Result Date: 01/11/2020 CLINICAL DATA:  Abdominal pain for 2 weeks with bladder spasms, suspected diverticulitis. EXAM: CT ABDOMEN AND PELVIS WITH CONTRAST TECHNIQUE: Multidetector CT imaging of the abdomen and pelvis was performed using the standard protocol following bolus administration of intravenous contrast. CONTRAST:  141mL OMNIPAQUE IOHEXOL 300 MG/ML  SOLN COMPARISON:  Four days ago FINDINGS: Lower chest: Atelectatic type opacity in the left lower lobe. Small sliding hiatal hernia Hepatobiliary: No focal liver abnormality.Cholecystectomy. Pancreas: Unremarkable. Spleen: Unremarkable. Adrenals/Urinary Tract: Negative adrenals. No hydronephrosis or stone. Unremarkable bladder. Stomach/Bowel: 2 areas of sigmoid diverticulitis were seen previously. The more proximal has improved and the more distal has progressed. Distally there is now a gas and fluid collection spanning the wall and pericolonic fat, measuring up to 3.5 cm. Regional inflammation has also increased at the distal site. No free pneumoperitoneum. No bowel obstruction Vascular/Lymphatic: No acute vascular abnormality. Atherosclerotic calcifications no mass or adenopathy. Reproductive:No pathologic findings. Other: No ascites or pneumoperitoneum. Musculoskeletal: No acute abnormalities. IMPRESSION: Two sites of sigmoid diverticulitis seen on recent abdominal CT. The more proximal is improved and the more distal is progressed. At the progressed site there is now a 3.5 cm intramural to pericolonic abscess. Electronically  Signed   By: Monte Fantasia M.D.   On: 01/11/2020 05:45   DG Chest Port 1 View  Result Date: 01/11/2020 CLINICAL DATA:  Fever EXAM: PORTABLE CHEST 1 VIEW COMPARISON:  CT of the pelvis dated January 07, 2020 FINDINGS: There is an airspace opacity at the left lung base. This is favored to represent an area of atelectasis as seen on the patient's prior CT. The heart size is normal. Aortic calcifications are noted. There is no pneumothorax. No significant pleural effusion. IMPRESSION: No active disease. Electronically Signed   By: Constance Holster M.D.   On: 01/11/2020 00:56     Assessment & Plan:  Alexa Wilson is a 74 y.o. female with a one week history of LLQ abdominal pain. -Assessment: Given the location of pain and clinical presentation, as well as the CT imaging, the likely diagnosis is sigmoid diverticulitis. Pain has significantly reduced and imaging suggests regression of proximal diverticulitis. Distal diverticulitis progressing with intramural abscess formation.  -Plan: No surgical intervention is needed at this time. The patient is progressing well with current antibiotic regimen. Intervention for abscess is not indicated at this time due to its nature and size. Continue antibiotics and follow up as needed.   Fredderick Severance 01/11/2020, 1:49 PM

## 2020-01-12 LAB — COMPREHENSIVE METABOLIC PANEL
ALT: 16 U/L (ref 0–44)
AST: 20 U/L (ref 15–41)
Albumin: 2.9 g/dL — ABNORMAL LOW (ref 3.5–5.0)
Alkaline Phosphatase: 108 U/L (ref 38–126)
Anion gap: 9 (ref 5–15)
BUN: 11 mg/dL (ref 8–23)
CO2: 25 mmol/L (ref 22–32)
Calcium: 8.5 mg/dL — ABNORMAL LOW (ref 8.9–10.3)
Chloride: 108 mmol/L (ref 98–111)
Creatinine, Ser: 0.79 mg/dL (ref 0.44–1.00)
GFR calc Af Amer: >60 mL/min (ref >60)
GFR calc non Af Amer: >60 mL/min (ref >60)
Glucose, Bld: 85 mg/dL (ref 70–99)
Potassium: 3.3 mmol/L — ABNORMAL LOW (ref 3.5–5.1)
Sodium: 142 mmol/L (ref 135–145)
Total Bilirubin: 0.6 mg/dL (ref 0.3–1.2)
Total Protein: 6.4 g/dL — ABNORMAL LOW (ref 6.5–8.1)

## 2020-01-12 LAB — CBC
HCT: 32.4 % — ABNORMAL LOW (ref 36.0–46.0)
Hemoglobin: 10.3 g/dL — ABNORMAL LOW (ref 12.0–15.0)
MCH: 32.3 pg (ref 26.0–34.0)
MCHC: 31.8 g/dL (ref 30.0–36.0)
MCV: 101.6 fL — ABNORMAL HIGH (ref 80.0–100.0)
Platelets: 335 10*3/uL (ref 150–400)
RBC: 3.19 MIL/uL — ABNORMAL LOW (ref 3.87–5.11)
RDW: 12.8 % (ref 11.5–15.5)
WBC: 5.3 10*3/uL (ref 4.0–10.5)
nRBC: 0 % (ref 0.0–0.2)

## 2020-01-12 LAB — URINE CULTURE: Culture: NO GROWTH

## 2020-01-12 LAB — MAGNESIUM: Magnesium: 2.1 mg/dL (ref 1.7–2.4)

## 2020-01-12 MED ORDER — LIDOCAINE 5 % EX PTCH
1.0000 | MEDICATED_PATCH | CUTANEOUS | Status: DC
  Administered 2020-01-12 – 2020-01-13 (×2): 1 via TRANSDERMAL
  Filled 2020-01-12 (×2): qty 1

## 2020-01-12 MED ORDER — POTASSIUM CHLORIDE CRYS ER 20 MEQ PO TBCR
40.0000 meq | EXTENDED_RELEASE_TABLET | Freq: Once | ORAL | Status: AC
  Administered 2020-01-12: 40 meq via ORAL
  Filled 2020-01-12: qty 2

## 2020-01-12 NOTE — Progress Notes (Signed)
PROGRESS NOTE    Alexa Wilson  ZOX:[id number] DOB: [birth year] DOA: 01/11/2020 PCP: Ignatius Specking, MD   Brief Narrative:  Per HPI: Alexa Wilson is a 74 y.o. female with medical history significant for panic disorder, GERD, dyslipidemia, hypertension, and hypothyroidism who was started on Augmentin for suspected diverticulitis by her PCP around 3/5.  She began to have some worsening pain for which she came to the ED on 3/7 and was discharged home with plans to continue the same along with Pyridium for bladder spasms.  Unfortunately, her abdominal pain continued to worsen according to her son and she has been having urinary urgency with small amounts of voiding.  The patient seems to think that her abdominal pain is improving, however she denies any diarrhea and actually states she has not had a bowel movement in the last 3 days.  No nausea or vomiting reported, nor other fevers or chills.  She appears to be mildly confused and is a difficult historian.  3/12: Patient was admitted with complicated sigmoid diverticulitis with distal intramural abscess.  She continues on IV Zosyn and is tolerating diet well.  We will plan advance diet to full today and continue on IV antibiotics.  Potassium supplementation.  Appreciate general surgery evaluation with no plans for intervention during this hospitalization, but will follow outpatient.  Assessment & Plan:   Active Problems:   Diverticulitis of intestine with abscess  Complicated sigmoid diverticulitis with distal intramural/pericolonic abscess -Continue gentle IV fluid as well as Zosyn -Appreciate general surgery evaluation -Soft diet for now -No signs of peritonitis noted -Follow a.m. labs  Hypokalemia-improving -Repeat oral supplementation and monitor a.m. labs along with magnesium  Chronic lumbago -Up in chair and lidocaine patch -We will order for ambulation over the weekend  Hypothyroidism -Continue Synthroid and check  TSH  Hypertension -Continue metoprolol and monitor  Dyslipidemia -Continue statin  GERD -PPI daily  Panic disorder -Xanax as needed    DVT prophylaxis: Lovenox Code Status: Full Family Communication: Plan to discuss with son Disposition Plan: Continue ongoing treatment with IV Zosyn through the weekend.   Consultants:   General surgery  Procedures:   See below  Antimicrobials:  Anti-infectives (From admission, onward)   Start     Dose/Rate Route Frequency Ordered Stop   01/11/20 1400  piperacillin-tazobactam (ZOSYN) IVPB 3.375 g     3.375 g 12.5 mL/hr over 240 Minutes Intravenous Every 8 hours 01/11/20 1125     01/11/20 0615  piperacillin-tazobactam (ZOSYN) IVPB 3.375 g     3.375 g 100 mL/hr over 30 Minutes Intravenous  Once 01/11/20 0607 01/11/20 0650   01/11/20 0315  cefTRIAXone (ROCEPHIN) 2 g in sodium chloride 0.9 % 100 mL IVPB  Status:  Discontinued     2 g 200 mL/hr over 30 Minutes Intravenous Daily at bedtime 01/11/20 0301 01/11/20 0706   01/11/20 0315  azithromycin (ZITHROMAX) 500 mg in sodium chloride 0.9 % 250 mL IVPB  Status:  Discontinued     500 mg 250 mL/hr over 60 Minutes Intravenous Daily at bedtime 01/11/20 0301 01/11/20 0706        Subjective: Patient seen and evaluated today with no new acute complaints or concerns. No acute concerns or events noted overnight.  She denies any abdominal pain and did have 2 bowel movements overnight.  Tolerating diet well.  Complains of low back pain.  Objective: Vitals:   01/11/20 1420 01/11/20 2016 01/11/20 2227 01/12/20 0621  BP: (!) 114/57  (!) 106/50 129/66  Pulse: 80  71 75  Resp: 15  20 18   Temp: 98.6 F (37 C)  98.8 F (37.1 C) 97.9 F (36.6 C)  TempSrc:   Oral Oral  SpO2: 99% 99% 99% 100%  Weight:      Height:        Intake/Output Summary (Last 24 hours) at 01/12/2020 1131 Last data filed at 01/12/2020 0900 Gross per 24 hour  Intake 424.78 ml  Output --  Net 424.78 ml   Filed  Weights   01/11/20 0009  Weight: 87.5 kg    Examination:  General exam: Appears calm and comfortable  Respiratory system: Clear to auscultation. Respiratory effort normal. Cardiovascular system: S1 & S2 heard, RRR. No JVD, murmurs, rubs, gallops or clicks. No pedal edema. Gastrointestinal system: Abdomen is nondistended, soft and nontender. No organomegaly or masses felt. Normal bowel sounds heard. Central nervous system: Alert and oriented. No focal neurological deficits. Extremities: Symmetric 5 x 5 power. Skin: No rashes, lesions or ulcers Psychiatry: Judgement and insight appear normal. Mood & affect appropriate.     Data Reviewed: I have personally reviewed following labs and imaging studies  CBC: Recent Labs  Lab 01/06/20 1944 01/11/20 0049 01/12/20 0432  WBC 11.2* 10.8* 5.3  NEUTROABS  --  7.8*  --   HGB 13.6 11.8* 10.3*  HCT 41.5 36.7 32.4*  MCV 99.3 100.3* 101.6*  PLT 341 353 335   Basic Metabolic Panel: Recent Labs  Lab 01/06/20 1944 01/11/20 0049 01/11/20 0735 01/12/20 0432  NA 138 133*  --  142  K 3.5 3.0*  --  3.3*  CL 104 99  --  108  CO2 24 25  --  25  GLUCOSE 118* 109*  --  85  BUN 10 8  --  11  CREATININE 0.89 0.76  --  0.79  CALCIUM 9.4 8.5*  --  8.5*  MG  --   --  1.9 2.1   GFR: Estimated Creatinine Clearance: 70.8 mL/min (by C-G formula based on SCr of 0.79 mg/dL). Liver Function Tests: Recent Labs  Lab 01/06/20 1944 01/11/20 0049 01/12/20 0432  AST 38 25 20  ALT 33 23 16  ALKPHOS 124 144* 108  BILITOT 1.5* 0.9 0.6  PROT 7.4 7.6 6.4*  ALBUMIN 3.8 3.4* 2.9*   Recent Labs  Lab 01/06/20 1944 01/11/20 0049  LIPASE 27 24   No results for input(s): AMMONIA in the last 168 hours. Coagulation Profile: Recent Labs  Lab 01/11/20 0049  INR 1.1   Cardiac Enzymes: No results for input(s): CKTOTAL, CKMB, CKMBINDEX, TROPONINI in the last 168 hours. BNP (last 3 results) No results for input(s): PROBNP in the last 8760  hours. HbA1C: No results for input(s): HGBA1C in the last 72 hours. CBG: No results for input(s): GLUCAP in the last 168 hours. Lipid Profile: No results for input(s): CHOL, HDL, LDLCALC, TRIG, CHOLHDL, LDLDIRECT in the last 72 hours. Thyroid Function Tests: Recent Labs    01/11/20 0049  TSH 4.389   Anemia Panel: No results for input(s): VITAMINB12, FOLATE, FERRITIN, TIBC, IRON, RETICCTPCT in the last 72 hours. Sepsis Labs: Recent Labs  Lab 01/11/20 0048 01/11/20 0308  LATICACIDVEN 0.8 1.1    Recent Results (from the past 240 hour(s))  Urine culture     Status: None   Collection Time: 01/06/20 11:20 PM   Specimen: Urine, Random  Result Value Ref Range Status   Specimen Description URINE, RANDOM  Final   Special Requests ADDED [phone] 01/07/2020  Final   Culture   Final    NO GROWTH Performed at [facility], [address], [place], [place] [address]    Report Status 01/08/2020 FINAL  Final  Blood Culture (routine x 2)     Status: None (Preliminary result)   Collection Time: 01/11/20 12:48 AM   Specimen: BLOOD  Result Value Ref Range Status   Specimen Description BLOOD RIGHT ANTECUBITAL  Final   Special Requests   Final    BOTTLES DRAWN AEROBIC AND ANAEROBIC Blood Culture results may not be optimal due to an excessive volume of blood received in culture bottles   Culture   Final    NO GROWTH 1 DAY Performed at [facility], [address] [place], [place] [address]    Report Status PENDING  Incomplete  Blood Culture (routine x 2)     Status: None (Preliminary result)   Collection Time: 01/11/20 12:50 AM   Specimen: BLOOD  Result Value Ref Range Status   Specimen Description BLOOD LEFT ANTECUBITAL  Final   Special Requests   Final    BOTTLES DRAWN AEROBIC AND ANAEROBIC Blood Culture results may not be optimal due to an excessive volume of blood received in culture bottles   Culture   Final    NO GROWTH 1 DAY Performed at [facility], [address]  [place], [place] [address]    Report Status PENDING  Incomplete  Urine culture     Status: None   Collection Time: 01/11/20  1:35 AM   Specimen: In/Out Cath Urine  Result Value Ref Range Status   Specimen Description   Final    IN/OUT CATH URINE Performed at [facility], [address] [place], [place] [address]    Special Requests   Final    NONE Performed at [facility], [address], [place], [place] [address]    Culture   Final    NO GROWTH Performed at [facility], [address], [place], [place] [address]    Report Status 01/12/2020 FINAL  Final  SARS CORONAVIRUS 2 (TAT 6-24 HRS) Nasopharyngeal Nasopharyngeal Swab     Status: None   Collection Time: 01/11/20  4:59 AM   Specimen: Nasopharyngeal Swab  Result Value Ref Range Status   SARS Coronavirus 2 NEGATIVE NEGATIVE Final    Comment: (NOTE) SARS-CoV-2 target nucleic acids are NOT DETECTED. The SARS-CoV-2 RNA is generally detectable in upper and lower respiratory specimens during the acute phase of infection. Negative results do not preclude SARS-CoV-2 infection, do not rule out co-infections with other pathogens, and should not be used as the sole basis for treatment or other patient management decisions. Negative results must be combined with clinical observations, patient history, and epidemiological information. The expected result is Negative. Fact Sheet for Patients: HairSlick.no Fact Sheet for Healthcare Providers: quierodirigir.com This test is not yet approved or cleared by the [place] FDA and  has been authorized for detection and/or diagnosis of SARS-CoV-2 by FDA under an Emergency Use Authorization (EUA). This EUA will remain  in effect (meaning this test can be used) for the duration of the COVID-19 declaration under Section 56 4(b)(1) of the Act, 21 U.S.C. section 360bbb-3(b)(1), unless the authorization is terminated or revoked  sooner. Performed at [facility], [address], [place], [place] [address]          Radiology Studies: CT ABDOMEN PELVIS W CONTRAST  Result Date: 01/11/2020 CLINICAL DATA:  Abdominal pain for 2  weeks with bladder spasms, suspected diverticulitis. EXAM: CT ABDOMEN AND PELVIS WITH CONTRAST TECHNIQUE: Multidetector CT imaging of the abdomen and pelvis was performed using the standard protocol following bolus administration of intravenous contrast. CONTRAST:  OMNIPAQUE IOHEXOL 300 MG/ML  SOLN COMPARISON:  Four days ago FINDINGS: Lower chest: Atelectatic type opacity in the left lower lobe. Small sliding hiatal hernia Hepatobiliary: No focal liver abnormality.Cholecystectomy. Pancreas: Unremarkable. Spleen: Unremarkable. Adrenals/Urinary Tract: Negative adrenals. No hydronephrosis or stone. Unremarkable bladder. Stomach/Bowel: 2 areas of sigmoid diverticulitis were seen previously. The more proximal has improved and the more distal has progressed. Distally there is now a gas and fluid collection spanning the wall and pericolonic fat, measuring up to 3.5 cm. Regional inflammation has also increased at the distal site. No free pneumoperitoneum. No bowel obstruction Vascular/Lymphatic: No acute vascular abnormality. Atherosclerotic calcifications no mass or adenopathy. Reproductive:No pathologic findings. Other: No ascites or pneumoperitoneum. Musculoskeletal: No acute abnormalities. IMPRESSION: Two sites of sigmoid diverticulitis seen on recent abdominal CT. The more proximal is improved and the more distal is progressed. At the progressed site there is now a 3.5 cm intramural to pericolonic abscess. Electronically Signed   By: Marnee Spring M.D.   On: 01/11/2020 05:45   DG Chest Port 1 View  Result Date: 01/11/2020 CLINICAL DATA:  Fever EXAM: PORTABLE CHEST 1 VIEW COMPARISON:  CT of the pelvis dated January 07, 2020 FINDINGS: There is an airspace opacity at the left lung base. This is  favored to represent an area of atelectasis as seen on the patient's prior CT. The heart size is normal. Aortic calcifications are noted. There is no pneumothorax. No significant pleural effusion. IMPRESSION: No active disease. Electronically Signed   By: Katherine Mantle M.D.   On: 01/11/2020 00:56        Scheduled Meds: . alprazolam  1 mg Oral BID  . alprazolam  2 mg Oral Q1400  . docusate sodium  100 mg Oral BID  . enoxaparin (LOVENOX) injection  40 mg Subcutaneous Q24H  . levothyroxine  25 mcg Oral Daily  . lidocaine  1 patch Transdermal Q24H  . metoprolol succinate  12.5 mg Oral QHS  . rosuvastatin  20 mg Oral QHS   Continuous Infusions: . piperacillin-tazobactam (ZOSYN)  IV 3.375 g (01/12/20 0620)     LOS: 1 day    Time spent: 30 minutes    Alexa Wandersee Hoover Brunette, DO [facility]  If 7PM-7AM, please contact night-coverage [email or web address] 01/12/2020, 11:31 AM

## 2020-01-12 NOTE — Plan of Care (Signed)

## 2020-01-12 NOTE — Care Management Important Message (Signed)
Important Message  Patient Details  Name: Alexa Wilson MRN: [id number] Date of Birth: 1946/03/14   Medicare Important Message Given:  Yes     Tommy Medal 01/12/2020, 2:48 PM

## 2020-01-12 NOTE — Progress Notes (Signed)
[facility] Progress Note     Subjective: Patient was upright, alert and oriented and eating breakfast upon entering the room. She states she is pain free and feels "way better" than initial presentation. Had 2 bowel movements since yesterday, without associated pain.  Objective: Vital signs in last 24 hours: Temp:  [97.9 F (36.6 C)-98.8 F (37.1 C)] 97.9 F (36.6 C) (03/12 0621) Pulse Rate:  [71-80] 75 (03/12 0621) Resp:  [15-20] 18 (03/12 0621) BP: (106-129)/(50-66) 129/66 (03/12 0621) SpO2:  [99 %-100 %] 100 % (03/12 0621) Last BM Date: 01/11/20  Intake/Output from previous day: 03/11 0701 - 03/12 0700 In: 304.8 [I.V.:216.1; IV Piggyback:88.7] Out: -  Intake/Output this shift: Total I/O In: 120 [P.O.:120] Out: -   General appearance: alert, cooperative and no distress Resp: clear to auscultation bilaterally Cardio: regular rate and rhythm, S1, S2 normal, no murmur, click, rub or gallop GI: soft, non-tender; bowel sounds normal; no masses,  no organomegaly. No tenderness to palpation.  Lab Results:  Recent Labs    01/11/20 0049 01/12/20 0432  WBC 10.8* 5.3  HGB 11.8* 10.3*  HCT 36.7 32.4*  PLT 353 335   BMET Recent Labs    01/11/20 0049 01/12/20 0432  NA 133* 142  K 3.0* 3.3*  CL 99 108  CO2 25 25  GLUCOSE 109* 85  BUN 8 11  CREATININE 0.76 0.79  CALCIUM 8.5* 8.5*   PT/INR Recent Labs    01/11/20 0049  LABPROT 14.5  INR 1.1    Studies/Results: CT ABDOMEN PELVIS W CONTRAST  Result Date: 01/11/2020 CLINICAL DATA:  Abdominal pain for 2 weeks with bladder spasms, suspected diverticulitis. EXAM: CT ABDOMEN AND PELVIS WITH CONTRAST TECHNIQUE: Multidetector CT imaging of the abdomen and pelvis was performed using the standard protocol following bolus administration of intravenous contrast. CONTRAST:  154mL OMNIPAQUE IOHEXOL 300 MG/ML  SOLN COMPARISON:  Four days ago FINDINGS: Lower chest: Atelectatic type opacity in the left lower  lobe. Small sliding hiatal hernia Hepatobiliary: No focal liver abnormality.Cholecystectomy. Pancreas: Unremarkable. Spleen: Unremarkable. Adrenals/Urinary Tract: Negative adrenals. No hydronephrosis or stone. Unremarkable bladder. Stomach/Bowel: 2 areas of sigmoid diverticulitis were seen previously. The more proximal has improved and the more distal has progressed. Distally there is now a gas and fluid collection spanning the wall and pericolonic fat, measuring up to 3.5 cm. Regional inflammation has also increased at the distal site. No free pneumoperitoneum. No bowel obstruction Vascular/Lymphatic: No acute vascular abnormality. Atherosclerotic calcifications no mass or adenopathy. Reproductive:No pathologic findings. Other: No ascites or pneumoperitoneum. Musculoskeletal: No acute abnormalities. IMPRESSION: Two sites of sigmoid diverticulitis seen on recent abdominal CT. The more proximal is improved and the more distal is progressed. At the progressed site there is now a 3.5 cm intramural to pericolonic abscess. Electronically Signed   By: Monte Fantasia M.D.   On: 01/11/2020 05:45   DG Chest Port 1 View  Result Date: 01/11/2020 CLINICAL DATA:  Fever EXAM: PORTABLE CHEST 1 VIEW COMPARISON:  CT of the pelvis dated January 07, 2020 FINDINGS: There is an airspace opacity at the left lung base. This is favored to represent an area of atelectasis as seen on the patient's prior CT. The heart size is normal. Aortic calcifications are noted. There is no pneumothorax. No significant pleural effusion. IMPRESSION: No active disease. Electronically Signed   By: Constance Holster M.D.   On: 01/11/2020 00:56    Anti-infectives: Anti-infectives (From admission, onward)   Start     Dose/Rate Route Frequency  Ordered Stop   01/11/20 1400  piperacillin-tazobactam (ZOSYN) IVPB 3.375 g     3.375 g 12.5 mL/hr over 240 Minutes Intravenous Every 8 hours 01/11/20 1125     01/11/20 0615  piperacillin-tazobactam (ZOSYN) IVPB  3.375 g     3.375 g 100 mL/hr over 30 Minutes Intravenous  Once 01/11/20 0607 01/11/20 0650   01/11/20 0315  cefTRIAXone (ROCEPHIN) 2 g in sodium chloride 0.9 % 100 mL IVPB  Status:  Discontinued     2 g 200 mL/hr over 30 Minutes Intravenous Daily at bedtime 01/11/20 0301 01/11/20 0706   01/11/20 0315  azithromycin (ZITHROMAX) 500 mg in sodium chloride 0.9 % 250 mL IVPB  Status:  Discontinued     500 mg 250 mL/hr over 60 Minutes Intravenous Daily at bedtime 01/11/20 0301 01/11/20 0706      Assessment/Plan:  Interpretation: Patient is eating without nausea or vomiting. She has had 2 bowel movements in the past day without diarrhea, blood, or pain on defecation. Patient pain free, including when palpating abdomen. WBC count down to 5.3 today, was 10.8 on 3/11. Overall, patient is progressing well with current treatment regimen.  Plan: Given the excellent progress the patient is making, we will continue her current treatment regimen. Recommend 1 to 2 additional days of IV antibiotics and switching back to Augmentin following the IV. If patient status continues to improve, aim for discharge this weekend. Will see patient tomorrow and continue to monitor her status.    LOS: 1 day    Fredderick Severance 01/12/2020

## 2020-01-13 LAB — CBC
HCT: 32.2 % — ABNORMAL LOW (ref 36.0–46.0)
Hemoglobin: 10.1 g/dL — ABNORMAL LOW (ref 12.0–15.0)
MCH: 32.1 pg (ref 26.0–34.0)
MCHC: 31.4 g/dL (ref 30.0–36.0)
MCV: 102.2 fL — ABNORMAL HIGH (ref 80.0–100.0)
Platelets: 380 10*3/uL (ref 150–400)
RBC: 3.15 MIL/uL — ABNORMAL LOW (ref 3.87–5.11)
RDW: 12.9 % (ref 11.5–15.5)
WBC: 4.2 10*3/uL (ref 4.0–10.5)
nRBC: 0 % (ref 0.0–0.2)

## 2020-01-13 LAB — MAGNESIUM: Magnesium: 2 mg/dL (ref 1.7–2.4)

## 2020-01-13 LAB — COMPREHENSIVE METABOLIC PANEL
ALT: 16 U/L (ref 0–44)
AST: 22 U/L (ref 15–41)
Albumin: 2.8 g/dL — ABNORMAL LOW (ref 3.5–5.0)
Alkaline Phosphatase: 101 U/L (ref 38–126)
Anion gap: 8 (ref 5–15)
BUN: 11 mg/dL (ref 8–23)
CO2: 26 mmol/L (ref 22–32)
Calcium: 8.4 mg/dL — ABNORMAL LOW (ref 8.9–10.3)
Chloride: 107 mmol/L (ref 98–111)
Creatinine, Ser: 0.8 mg/dL (ref 0.44–1.00)
GFR calc Af Amer: >60 mL/min (ref >60)
GFR calc non Af Amer: >60 mL/min (ref >60)
Glucose, Bld: 88 mg/dL (ref 70–99)
Potassium: 3.4 mmol/L — ABNORMAL LOW (ref 3.5–5.1)
Sodium: 141 mmol/L (ref 135–145)
Total Bilirubin: 0.5 mg/dL (ref 0.3–1.2)
Total Protein: 6.3 g/dL — ABNORMAL LOW (ref 6.5–8.1)

## 2020-01-13 MED ORDER — POTASSIUM CHLORIDE CRYS ER 20 MEQ PO TBCR
40.0000 meq | EXTENDED_RELEASE_TABLET | Freq: Once | ORAL | Status: AC
  Administered 2020-01-13: 40 meq via ORAL
  Filled 2020-01-13: qty 2

## 2020-01-13 NOTE — Progress Notes (Signed)
PROGRESS NOTE    Alexa Wilson  ZOX:[id number] DOB: 1946/05/01 DOA: 01/11/2020 PCP: Ignatius Specking, MD   Brief Narrative:  Per HPI: Alexa Wilson a 74 y.o.femalewith medical history significant forpanic disorder, GERD, dyslipidemia, hypertension, and hypothyroidism who was started on Augmentin for suspected diverticulitis by her PCP around 3/5. She began to have some worsening pain for which she came to the ED on 3/7 and was discharged home with plans to continue the same along with Pyridium for bladder spasms. Unfortunately, her abdominal pain continued to worsen according to her son and she has been having urinary urgency with small amounts of voiding. The patient seems to think that her abdominal pain is improving,however she denies any diarrhea and actually states she has not had a bowel movement in the last 3 days. No nausea or vomiting reported, nor other fevers or chills. She appears to be mildly confused and is a difficult historian.  3/12: Patient was admitted with complicated sigmoid diverticulitis with distal intramural abscess.  She continues on IV Zosyn and is tolerating diet well.  We will plan advance diet to full today and continue on IV antibiotics.  Potassium supplementation.  Appreciate general surgery evaluation with no plans for intervention during this hospitalization, but will follow outpatient.  3/13: Patient continues to have some minimal abdominal pain, but is having bowel movements and tolerating diet and otherwise doing well.  Anticipate discharge in the next 1-2 days if improved.  Appreciate general surgery evaluation.  Assessment & Plan:   Active Problems:   Diverticulitis of intestine with abscess   Complicatedsigmoiddiverticulitis with distal intramural/pericolonic abscess -Continue gentle IV fluid as well as Zosyn with possible discharge in a.m. -Appreciate general surgery evaluation -Soft diet for now -No signs of peritonitis  noted -Follow a.m. labs with no significant findings noted  Hypokalemia-persistent -Repeat oral supplementation and monitor a.m. labs along with magnesium  Chronic lumbago -Up in chair and lidocaine patch  Hypothyroidism -Continue Synthroid and TSH 4.389  Hypertension-controlled -Continue metoprolol and monitor  Dyslipidemia -Continue statin  GERD -PPIdaily  Panic disorder -Xanax as needed   DVT prophylaxis: Lovenox Code Status: Full Family Communication: Plan to discuss with son Disposition Plan: Continue ongoing treatment with IV Zosyn for 1-2 more days.   Consultants:   General surgery  Procedures:   See below  Antimicrobials:  Anti-infectives (From admission, onward)   Start     Dose/Rate Route Frequency Ordered Stop   01/11/20 1400  piperacillin-tazobactam (ZOSYN) IVPB 3.375 g     3.375 g 12.5 mL/hr over 240 Minutes Intravenous Every 8 hours 01/11/20 1125     01/11/20 0615  piperacillin-tazobactam (ZOSYN) IVPB 3.375 g     3.375 g 100 mL/hr over 30 Minutes Intravenous  Once 01/11/20 0607 01/11/20 0650   01/11/20 0315  cefTRIAXone (ROCEPHIN) 2 g in sodium chloride 0.9 % 100 mL IVPB  Status:  Discontinued     2 g 200 mL/hr over 30 Minutes Intravenous Daily at bedtime 01/11/20 0301 01/11/20 0706   01/11/20 0315  azithromycin (ZITHROMAX) 500 mg in sodium chloride 0.9 % 250 mL IVPB  Status:  Discontinued     500 mg 250 mL/hr over 60 Minutes Intravenous Daily at bedtime 01/11/20 0301 01/11/20 0706       Subjective: Patient seen and evaluated today with some ongoing mild abdominal and low back pain.  She continues to have some bowel movements and is tolerating diet.  Objective: Vitals:   01/12/20 0621 01/12/20 1358 01/12/20 2113  01/13/20 0508  BP: 129/66 (!) 96/42 131/84 124/64  Pulse: 75 77 88 64  Resp: 18 18 20 18   Temp: 97.9 F (36.6 C) 98.3 F (36.8 C) 99.1 F (37.3 C) 98.1 F (36.7 C)  TempSrc: Oral  Oral   SpO2: 100% 99% 100%  99%  Weight:      Height:        Intake/Output Summary (Last 24 hours) at 01/13/2020 1140 Last data filed at 01/13/2020 0900 Gross per 24 hour  Intake 600 ml  Output 1200 ml  Net -600 ml   Filed Weights   01/11/20 0009  Weight: 87.5 kg    Examination:  General exam: Appears calm and comfortable  Respiratory system: Clear to auscultation. Respiratory effort normal. Cardiovascular system: S1 & S2 heard, RRR. No JVD, murmurs, rubs, gallops or clicks. No pedal edema. Gastrointestinal system: Abdomen is nondistended, soft and nontender. No organomegaly or masses felt. Normal bowel sounds heard. Central nervous system: Alert and oriented. No focal neurological deficits. Extremities: Symmetric 5 x 5 power. Skin: No rashes, lesions or ulcers Psychiatry: Judgement and insight appear normal. Mood & affect appropriate.     Data Reviewed: I have personally reviewed following labs and imaging studies  CBC: Recent Labs  Lab 01/06/20 1944 01/11/20 0049 01/12/20 0432 01/13/20 0644  WBC 11.2* 10.8* 5.3 4.2  NEUTROABS  --  7.8*  --   --   HGB 13.6 11.8* 10.3* 10.1*  HCT 41.5 36.7 32.4* 32.2*  MCV 99.3 100.3* 101.6* 102.2*  PLT 341 353 335 380   Basic Metabolic Panel: Recent Labs  Lab 01/06/20 1944 01/11/20 0049 01/11/20 0735 01/12/20 0432 01/13/20 0644  NA 138 133*  --  142 141  K 3.5 3.0*  --  3.3* 3.4*  CL 104 99  --  108 107  CO2 24 25  --  25 26  GLUCOSE 118* 109*  --  85 88  BUN 10 8  --  11 11  CREATININE 0.89 0.76  --  0.79 0.80  CALCIUM 9.4 8.5*  --  8.5* 8.4*  MG  --   --  1.9 2.1 2.0   GFR: Estimated Creatinine Clearance: 70.8 mL/min (by C-G formula based on SCr of 0.8 mg/dL). Liver Function Tests: Recent Labs  Lab 01/06/20 1944 01/11/20 0049 01/12/20 0432 01/13/20 0644  AST 38 25 20 22   ALT 33 23 16 16   ALKPHOS 124 144* 108 101  BILITOT 1.5* 0.9 0.6 0.5  PROT 7.4 7.6 6.4* 6.3*  ALBUMIN 3.8 3.4* 2.9* 2.8*   Recent Labs  Lab 01/06/20 1944  01/11/20 0049  LIPASE 27 24   No results for input(s): AMMONIA in the last 168 hours. Coagulation Profile: Recent Labs  Lab 01/11/20 0049  INR 1.1   Cardiac Enzymes: No results for input(s): CKTOTAL, CKMB, CKMBINDEX, TROPONINI in the last 168 hours. BNP (last 3 results) No results for input(s): PROBNP in the last 8760 hours. HbA1C: No results for input(s): HGBA1C in the last 72 hours. CBG: No results for input(s): GLUCAP in the last 168 hours. Lipid Profile: No results for input(s): CHOL, HDL, LDLCALC, TRIG, CHOLHDL, LDLDIRECT in the last 72 hours. Thyroid Function Tests: Recent Labs    01/11/20 0049  TSH 4.389   Anemia Panel: No results for input(s): VITAMINB12, FOLATE, FERRITIN, TIBC, IRON, RETICCTPCT in the last 72 hours. Sepsis Labs: Recent Labs  Lab 01/11/20 0048 01/11/20 0308  LATICACIDVEN 0.8 1.1    Recent Results (from the past 240 hour(s))  Urine culture     Status: None   Collection Time: 01/06/20 11:20 PM   Specimen: Urine, Random  Result Value Ref Range Status   Specimen Description URINE, RANDOM  Final   Special Requests ADDED [phone] 01/07/2020  Final   Culture   Final    NO GROWTH Performed at [facility], [address], [place], [place] [address]    Report Status 01/08/2020 FINAL  Final  Blood Culture (routine x 2)     Status: None (Preliminary result)   Collection Time: 01/11/20 12:48 AM   Specimen: BLOOD  Result Value Ref Range Status   Specimen Description BLOOD RIGHT ANTECUBITAL  Final   Special Requests   Final    BOTTLES DRAWN AEROBIC AND ANAEROBIC Blood Culture results may not be optimal due to an excessive volume of blood received in culture bottles   Culture   Final    NO GROWTH 2 DAYS Performed at [facility], [address] [place], [place] [address]    Report Status PENDING  Incomplete  Blood Culture (routine x 2)     Status: None (Preliminary result)   Collection Time: 01/11/20 12:50 AM   Specimen: BLOOD  Result Value  Ref Range Status   Specimen Description BLOOD LEFT ANTECUBITAL  Final   Special Requests   Final    BOTTLES DRAWN AEROBIC AND ANAEROBIC Blood Culture results may not be optimal due to an excessive volume of blood received in culture bottles   Culture   Final    NO GROWTH 2 DAYS Performed at [facility], [address] [place], [place] [address]    Report Status PENDING  Incomplete  Urine culture     Status: None   Collection Time: 01/11/20  1:35 AM   Specimen: In/Out Cath Urine  Result Value Ref Range Status   Specimen Description   Final    IN/OUT CATH URINE Performed at [facility], [address], [place], [place] [address]    Special Requests   Final    NONE Performed at [facility], [address], [place], [place] [address]    Culture   Final    NO GROWTH Performed at [facility], [address], [place], [place] [address]    Report Status 01/12/2020 FINAL  Final  SARS CORONAVIRUS 2 (TAT 6-24 HRS) Nasopharyngeal Nasopharyngeal Swab     Status: None   Collection Time: 01/11/20  4:59 AM   Specimen: Nasopharyngeal Swab  Result Value Ref Range Status   SARS Coronavirus 2 NEGATIVE NEGATIVE Final    Comment: (NOTE) SARS-CoV-2 target nucleic acids are NOT DETECTED. The SARS-CoV-2 RNA is generally detectable in upper and lower respiratory specimens during the acute phase of infection. Negative results do not preclude SARS-CoV-2 infection, do not rule out co-infections with other pathogens, and should not be used as the sole basis for treatment or other patient management decisions. Negative results must be combined with clinical observations, patient history, and epidemiological information. The expected result is Negative. Fact Sheet for Patients: HairSlick.no Fact Sheet for Healthcare Providers: quierodirigir.com This test is not yet approved or cleared by the [place] FDA and  has been authorized for  detection and/or diagnosis of SARS-CoV-2 by FDA under an Emergency Use Authorization (EUA). This EUA will remain  in effect (meaning this test can be used) for the duration of the COVID-19 declaration under Section 56 4(b)(1) of the Act, 21 U.S.C. section 360bbb-3(b)(1), unless the authorization is terminated or  revoked sooner. Performed at [facility], [address], [place], [place] [address]          Radiology Studies: No results found.      Scheduled Meds: . alprazolam  1 mg Oral BID  . alprazolam  2 mg Oral Q1400  . docusate sodium  100 mg Oral BID  . enoxaparin (LOVENOX) injection  40 mg Subcutaneous Q24H  . levothyroxine  25 mcg Oral Daily  . lidocaine  1 patch Transdermal Q24H  . metoprolol succinate  12.5 mg Oral QHS  . potassium chloride  40 mEq Oral Once  . rosuvastatin  20 mg Oral QHS   Continuous Infusions: . piperacillin-tazobactam (ZOSYN)  IV 3.375 g (01/12/20 2135)     LOS: 2 days    Time spent: 30 minutes    Vaun Hyndman Hoover Brunette, DO [facility]  If 7PM-7AM, please contact night-coverage [email or web address] 01/13/2020, 11:40 AM

## 2020-01-13 NOTE — Discharge Instructions (Signed)
Diverticulosis/ Diverticulitis Information and Diet:   Diverticulosis is a condition in which small, bulging pouches (diverticuli) form inside the lower part of the intestine, usually in the colon. Constipation and straining during bowel movements can worsen the condition. A diet rich in fiber can help keep stools soft and prevent inflammation.  Diverticulitis occurs when the pouches in the colon become infected or inflamed. Dietary changes can help the colon heal.  Fiber is an important part of the diet for patients with diverticulosis. A high-fiber diet softens and gives bulk to the stool, allowing it to pass quickly and easily.  Diet for Diverticulosis Eat a high-fiber diet when you have diverticulosis. Fiber softens the stool and helps prevent constipation. It also can help decrease pressure in the colon and help prevent flare-ups of diverticulitis.  High-fiber foods include:  Beans and legumes Bran, whole wheat bread and whole grain cereals such as oatmeal Brown and wild rice Fruits such as apples, bananas and pears Vegetables such as broccoli, carrots, corn and squash Whole wheat pasta If you currently don't have a diet high in fiber, you should add fiber gradually. This helps avoid bloating and abdominal discomfort. The target is to eat 25 to 30 grams of fiber daily. Drink at least 8 cups of fluid daily. Fluid will help soften your stool. Exercise also promotes bowel movement and helps prevent constipation.  When the colon is not inflamed, eat popcorn, nuts and seeds as tolerated.  Diet for Diverticulitis During flare ups of diverticulitis, follow a clear liquid diet. Your doctor will let you know when to progress from clear liquids to low fiber solids and then back to your normal diet.  A clear liquid diet means no solid foods. Juices should have no pulp. During the clear liquid diet, you may consume:  Broth Clear juices such as apple, cranberry and grape. (Avoid orange  juice) Jell-O Popsicles  When you're able to eat solid food, choose low fiber foods while healing. Low fiber foods include:  Canned or cooked fruit without seeds or skin, such as applesauce and melon Canned or well cooked vegetables without seeds and skin Dairy products such as cheese, milk and yogurt Eggs Low-fiber cereal Meat that is ground or tender and well cooked Pasta White bread and white rice AVOID RED MEAT WHILE YOU HAVE AN ACTIVE FLARE.  AVOID NSAIDS, IBUPROFEN, ALEVE, ASPIRIN WHILE YOU HAVE AN ACTIVE FLARE.  EXERCISE AS WELL AS SMOKING CESSATION CAN HELP PREVENT RECURRENCES.   After symptoms improve, usually within two to four days, you may add 5 to 15 grams of fiber a day back into your diet. Resume your high fiber diet when you no longer have symptoms.    Diverticulitis  Diverticulitis is when small pockets in your large intestine (colon) get infected or swollen. This causes stomach pain and watery poop (diarrhea). These pouches are called diverticula. They form in people who have a condition called diverticulosis. Follow these instructions at home: Medicines  Take over-the-counter and prescription medicines only as told by your doctor. These include: ? Antibiotics. ? Pain medicines. ? Fiber pills. ? Probiotics. ? Stool softeners.  Do not drive or use heavy machinery while taking prescription pain medicine.  If you were prescribed an antibiotic, take it as told. Do not stop taking it even if you feel better. General instructions   Follow a diet as told by your doctor.  When you feel better, your doctor may tell you to change your diet. You may need to eat   a lot of fiber. Fiber makes it easier to poop (have bowel movements). Healthy foods with fiber include: ? Berries. ? Beans. ? Lentils. ? Green vegetables.  Exercise 3 or more times a week. Aim for 30 minutes each time. Exercise enough to sweat and make your heart beat faster.  Keep all follow-up  visits as told. This is important. You may need to have an exam of the large intestine. This is called a colonoscopy. Contact a doctor if:  Your pain does not get better.  You have a hard time eating or drinking.  You are not pooping like normal. Get help right away if:  Your pain gets worse.  Your problems do not get better.  Your problems get worse very fast.  You have a fever.  You throw up (vomit) more than one time.  You have poop that is: ? Bloody. ? Black. ? Tarry. Summary  Diverticulitis is when small pockets in your large intestine (colon) get infected or swollen.  Take medicines only as told by your doctor.  Follow a diet as told by your doctor. This information is not intended to replace advice given to you by your health care provider. Make sure you discuss any questions you have with your health care provider. Document Revised: 10/01/2017 Document Reviewed: 11/05/2016 Elsevier Patient Education  2020 Elsevier Inc.  

## 2020-01-13 NOTE — Progress Notes (Signed)
[facility] Progress Note     Subjective: Doing fair. Tolerating diet and having Bms. Some soreness. Worried about paying a copay of $90 at ED.   Objective: Vital signs in last 24 hours: Temp:  [98.1 F (36.7 C)-99.1 F (37.3 C)] 98.1 F (36.7 C) (03/13 0508) Pulse Rate:  [64-88] 64 (03/13 0508) Resp:  [18-20] 18 (03/13 0508) BP: (96-131)/(42-84) 124/64 (03/13 0508) SpO2:  [99 %-100 %] 99 % (03/13 0508) Last BM Date: 01/12/20  Intake/Output from previous day: 03/12 0701 - 03/13 0700 In: 480 [P.O.:480] Out: 1200 [Urine:1200] Intake/Output this shift: Total I/O In: 480 [P.O.:480] Out: -   General appearance: alert, cooperative and no distress Resp: normal work of breathing GI: soft, nondistended, appropriately tener, no rebound or guarding Extremities: extremities normal, atraumatic, no cyanosis or edema  Lab Results:  Recent Labs    01/12/20 0432 01/13/20 0644  WBC 5.3 4.2  HGB 10.3* 10.1*  HCT 32.4* 32.2*  PLT 335 380   BMET Recent Labs    01/12/20 0432 01/13/20 0644  NA 142 141  K 3.3* 3.4*  CL 108 107  CO2 25 26  GLUCOSE 85 88  BUN 11 11  CREATININE 0.79 0.80  CALCIUM 8.5* 8.4*   PT/INR Recent Labs    01/11/20 0049  LABPROT 14.5  INR 1.1    Anti-infectives: Anti-infectives (From admission, onward)   Start     Dose/Rate Route Frequency Ordered Stop   01/11/20 1400  piperacillin-tazobactam (ZOSYN) IVPB 3.375 g     3.375 g 12.5 mL/hr over 240 Minutes Intravenous Every 8 hours 01/11/20 1125     01/11/20 0615  piperacillin-tazobactam (ZOSYN) IVPB 3.375 g     3.375 g 100 mL/hr over 30 Minutes Intravenous  Once 01/11/20 0607 01/11/20 0650   01/11/20 0315  cefTRIAXone (ROCEPHIN) 2 g in sodium chloride 0.9 % 100 mL IVPB  Status:  Discontinued     2 g 200 mL/hr over 30 Minutes Intravenous Daily at bedtime 01/11/20 0301 01/11/20 0706   01/11/20 0315  azithromycin (ZITHROMAX) 500 mg in sodium chloride 0.9 % 250 mL IVPB  Status:   Discontinued     500 mg 250 mL/hr over 60 Minutes Intravenous Daily at bedtime 01/11/20 0301 01/11/20 [id number]      Assessment/Plan: Alexa Wilson is a 74 yo with diverticulitis with intramural abscess. Doing better.  Send home with some narcotic pain medication  Colace BID to keep stool soft and regular Low fiber soft diet for the first 1-2 weeks with flare Home on Augmentin for 10 more days (4 days IV in the hospital- total 14 days) - has 5 days remaining in her bottle, needs another 5 days   Will see in office Thursday 3/18.   Updated Dr. Manuella Ghazi.   LOS: 2 days    Virl Cagey 01/13/2020

## 2020-01-14 LAB — CBC
HCT: 38.5 % (ref 36.0–46.0)
Hemoglobin: 12.1 g/dL (ref 12.0–15.0)
MCH: 31.4 pg (ref 26.0–34.0)
MCHC: 31.4 g/dL (ref 30.0–36.0)
MCV: 100 fL (ref 80.0–100.0)
Platelets: 491 10*3/uL — ABNORMAL HIGH (ref 150–400)
RBC: 3.85 MIL/uL — ABNORMAL LOW (ref 3.87–5.11)
RDW: 12.6 % (ref 11.5–15.5)
WBC: 4.9 10*3/uL (ref 4.0–10.5)
nRBC: 0 % (ref 0.0–0.2)

## 2020-01-14 LAB — BASIC METABOLIC PANEL
Anion gap: 11 (ref 5–15)
BUN: 10 mg/dL (ref 8–23)
CO2: 22 mmol/L (ref 22–32)
Calcium: 9 mg/dL (ref 8.9–10.3)
Chloride: 107 mmol/L (ref 98–111)
Creatinine, Ser: 0.85 mg/dL (ref 0.44–1.00)
GFR calc Af Amer: >60 mL/min (ref >60)
GFR calc non Af Amer: >60 mL/min (ref >60)
Glucose, Bld: 107 mg/dL — ABNORMAL HIGH (ref 70–99)
Potassium: 3.5 mmol/L (ref 3.5–5.1)
Sodium: 140 mmol/L (ref 135–145)

## 2020-01-14 LAB — MAGNESIUM: Magnesium: 2 mg/dL (ref 1.7–2.4)

## 2020-01-14 MED ORDER — OXYCODONE-ACETAMINOPHEN 5-325 MG PO TABS: 2.0000 | ORAL_TABLET | Freq: Four times a day (QID) | ORAL | 0 refills | Status: DC | PRN

## 2020-01-14 MED ORDER — AMOXICILLIN-POT CLAVULANATE 500-125 MG PO TABS: 1.0000 | ORAL_TABLET | Freq: Two times a day (BID) | ORAL | 0 refills | Status: AC

## 2020-01-14 MED ORDER — LIDOCAINE 5 % EX PTCH: 1.0000 | MEDICATED_PATCH | CUTANEOUS | 0 refills | Status: DC

## 2020-01-14 MED ORDER — SODIUM CHLORIDE 0.9 % IV SOLN
INTRAVENOUS | Status: AC
  Filled 2020-01-14: qty 100

## 2020-01-14 MED ORDER — GLUCAGON HCL RDNA (DIAGNOSTIC) 1 MG IJ SOLR
INTRAMUSCULAR | Status: AC
  Filled 2020-01-14: qty 2

## 2020-01-14 NOTE — Progress Notes (Signed)
Discharge instructions given to patient, stated understanding. Pt calling son Abe People to come pick her up.

## 2020-01-14 NOTE — Plan of Care (Signed)

## 2020-01-14 NOTE — Discharge Summary (Signed)
Physician Discharge Summary  Alexa Wilson [id number] DOB: 16-Jan-1946 DOA: 01/11/2020  PCP: Glenda Chroman, MD  Admit date: 01/11/2020  Discharge date: 01/14/2020  Admitted From:Home  Disposition:  Home  Recommendations for Outpatient Follow-up:  1. Follow up with PCP in 1-2 weeks 2. Follow-up with Dr. Constance Haw with general surgery by 3/18 as recommended 3. Continue on Augmentin for total 10 more days to complete 14-day course of treatment.  5 more days have been prescribed as patient does have 5 days of Augmentin with her 4. Percocet 5/325 total of 15 tablets prescribed with 0 refills to assist with pain management 5. Lidocaine patches to assist with pain management for back pain 6. Continue Colace twice daily as previous  Home Health: None  Equipment/Devices: None  Discharge Condition: Stable  CODE STATUS: Full  Diet recommendation: Heart Healthy/low fiber soft diet for first 1-2 weeks  Brief/Interim Summary: Per HPI: Alexa Wilson a 74 y.o.femalewith medical history significant forpanic disorder, GERD, dyslipidemia, hypertension, and hypothyroidism who was started on Augmentin for suspected diverticulitis by her PCP around 3/5. She began to have some worsening pain for which she came to the ED on 3/7 and was discharged home with plans to continue the same along with Pyridium for bladder spasms. Unfortunately, her abdominal pain continued to worsen according to her son and she has been having urinary urgency with small amounts of voiding. The patient seems to think that her abdominal pain is improving,however she denies any diarrhea and actually states she has not had a bowel movement in the last 3 days. No nausea or vomiting reported, nor other fevers or chills. She appears to be mildly confused and is a difficult historian.  3/12:Patient was admitted with complicated sigmoid diverticulitis with distal intramural abscess. She continues on IV Zosyn and is  tolerating diet well. We will plan advance diet to full today and continue on IV antibiotics. Potassium supplementation. Appreciate general surgery evaluation with no plans for intervention during this hospitalization, but will follow outpatient.  3/13: Patient continues to have some minimal abdominal pain, but is having bowel movements and tolerating diet and otherwise doing well.  Anticipate discharge in the next 1-2 days if improved.  Appreciate general surgery evaluation.  3/14: Patient is overall doing quite well and has ongoing, but stable lower abdominal and low back pain.  She is having bowel movements and tolerating diet.  She has been told to remain on a low fiber diet and follow-up with general surgery as recommended within the week.  She will continue on Augmentin as prescribed for total 10 more days to complete 14-day course of treatment.  She has also been given some pain medications for pain control.  No other acute events noted throughout the course of this hospitalization and she has overall done quite well.  Discharge Diagnoses:  Active Problems:   Diverticulitis of intestine with abscess  Principal discharge diagnosis: Complicated sigmoid diverticulitis with distal intramural abscess.  Discharge Instructions  Discharge Instructions    Diet - low sodium heart healthy   Complete by: As directed    Increase activity slowly   Complete by: As directed      Allergies as of 01/14/2020      Reactions   Ciprofloxacin Nausea Only   Codeine Other (See Comments)   Increases anxiety, Makes me feel all whoozie   Metronidazole Nausea Only   Morphine And Related Nausea And Vomiting   Other Other (See Comments)   All antihistamines cause severe  anxiety      Medication List    TAKE these medications   acetaminophen 500 MG tablet Commonly known as: TYLENOL Take 500 mg by mouth every 6 (six) hours as needed for mild pain or moderate pain.   alprazolam 2 MG tablet Commonly  known as: XANAX Take 1-2 mg by mouth every 8 (eight) hours. Take 1/2 tablet 8 am and 8 pm then take 1 tablet at 2 pm   amoxicillin-clavulanate 500-125 MG tablet Commonly known as: AUGMENTIN Take 1 tablet (500 mg total) by mouth 2 (two) times daily for 5 days.   docusate sodium 100 MG capsule Commonly known as: Stool Softener Take 1 capsule (100 mg total) by mouth 2 (two) times daily.   levothyroxine 25 MCG tablet Commonly known as: SYNTHROID Take 25 mcg by mouth daily.   lidocaine 5 % Commonly known as: LIDODERM Place 1 patch onto the skin daily. Remove & Discard patch within 12 hours or as directed by MD   metoprolol succinate 25 MG 24 hr tablet Commonly known as: TOPROL-XL Take 0.5 tablets (12.5 mg total) by mouth daily. What changed: when to take this   oxyCODONE-acetaminophen 5-325 MG tablet Commonly known as: PERCOCET/ROXICET Take 2 tablets by mouth every 6 (six) hours as needed for severe pain.   phenazopyridine 100 MG tablet Commonly known as: PYRIDIUM Take 2 tablets (200 mg total) by mouth 3 (three) times daily as needed for pain.   rosuvastatin 10 MG tablet Commonly known as: CRESTOR Take 20 mg by mouth at bedtime.      Follow-up Information    Virl Cagey, MD Follow up on 01/18/2020.   Specialty: General Surgery Why: if the office has not called by tuesday, call and get a time to come thursday  Contact information: [address] [facility] [address] [phone]        Vyas, Dhruv B, MD Follow up in 1 week(s).   Specialty: Internal Medicine Contact information: [place] [address] [phone]          Allergies  Allergen Reactions  . Ciprofloxacin Nausea Only  . Codeine Other (See Comments)    Increases anxiety, Makes me feel all whoozie  . Metronidazole Nausea Only  . Morphine And Related Nausea And Vomiting  . Other Other (See Comments)    All antihistamines cause severe anxiety     Consultations:  None   Procedures/Studies: CT ABDOMEN PELVIS W CONTRAST  Result Date: 01/11/2020 CLINICAL DATA:  Abdominal pain for 2 weeks with bladder spasms, suspected diverticulitis. EXAM: CT ABDOMEN AND PELVIS WITH CONTRAST TECHNIQUE: Multidetector CT imaging of the abdomen and pelvis was performed using the standard protocol following bolus administration of intravenous contrast. CONTRAST:  117mL OMNIPAQUE IOHEXOL 300 MG/ML  SOLN COMPARISON:  Four days ago FINDINGS: Lower chest: Atelectatic type opacity in the left lower lobe. Small sliding hiatal hernia Hepatobiliary: No focal liver abnormality.Cholecystectomy. Pancreas: Unremarkable. Spleen: Unremarkable. Adrenals/Urinary Tract: Negative adrenals. No hydronephrosis or stone. Unremarkable bladder. Stomach/Bowel: 2 areas of sigmoid diverticulitis were seen previously. The more proximal has improved and the more distal has progressed. Distally there is now a gas and fluid collection spanning the wall and pericolonic fat, measuring up to 3.5 cm. Regional inflammation has also increased at the distal site. No free pneumoperitoneum. No bowel obstruction Vascular/Lymphatic: No acute vascular abnormality. Atherosclerotic calcifications no mass or adenopathy. Reproductive:No pathologic findings. Other: No ascites or pneumoperitoneum. Musculoskeletal: No acute abnormalities. IMPRESSION: Two sites of sigmoid diverticulitis seen on  recent abdominal CT. The more proximal is improved and the more distal is progressed. At the progressed site there is now a 3.5 cm intramural to pericolonic abscess. Electronically Signed   By: Monte Fantasia M.D.   On: 01/11/2020 05:45   CT ABDOMEN PELVIS W CONTRAST  Result Date: 01/07/2020 CLINICAL DATA:  Left lower quadrant pain. Fever and diarrhea. EXAM: CT ABDOMEN AND PELVIS WITH CONTRAST TECHNIQUE: Multidetector CT imaging of the abdomen and pelvis was performed using the standard protocol following bolus  administration of intravenous contrast. CONTRAST:  184mL OMNIPAQUE IOHEXOL 300 MG/ML  SOLN COMPARISON:  Most recent CT 07/02/2019 at [place]: Lower chest: Linear scarring in the left lower lobe. Mild hypoventilatory changes at the lung bases. There are coronary artery calcifications. Hepatobiliary: No focal hepatic abnormality. Hepatomegaly again seen, liver spans 20 cm cranial caudal. Postcholecystectomy without biliary dilatation. Pancreas: No ductal dilatation or inflammation. Spleen: Normal in size without focal abnormality. Adrenals/Urinary Tract: Normal adrenal glands. No hydronephrosis or perinephric edema. Homogeneous renal enhancement with symmetric excretion on delayed phase imaging. Urinary bladder is physiologically distended without wall thickening. Stomach/Bowel: Inflamed diverticulum in the proximal sigmoid colon with colonic wall thickening, pericolonic edema and inflammatory change, series 3, image 70. There is a second inflamed diverticulum in the more distal sigmoid colon with surrounding fat stranding and colonic wall thickening, series 3, image 74. Minimal adjacent free fluid but no perforation or abscess. Multiple additional noninflamed diverticula in the descending and sigmoid colon. No element of mural hypertrophy in the sigmoid colon is seen. Adjacent small bowel are fluid-filled and mildly prominent, consistent with reactive ileus. No evidence of obstruction. Stomach is nondistended, small hiatal hernia. Vascular/Lymphatic: Aortic atherosclerosis. No aortic aneurysm. Patent portal vein. No portal venous or mesenteric gas. Prominent lymph nodes along the sigmoid mesentery measuring up to 6 mm, likely reactive. Reproductive: Uterus and bilateral adnexa are unremarkable. Other: Fat stranding and small amount of free fluid in the pelvis. No abscess or free air. Musculoskeletal: There are no acute or suspicious osseous abnormalities. Again seen degenerative change in the spine.  IMPRESSION: 1. Two separate areas of uncomplicated diverticulitis in the sigmoid colon. No perforation or abscess. 2. Small hiatal hernia. Aortic Atherosclerosis (ICD10-I70.0). Electronically Signed   By: Keith Rake M.D.   On: 01/07/2020 02:47   DG Chest Port 1 View  Result Date: 01/11/2020 CLINICAL DATA:  Fever EXAM: PORTABLE CHEST 1 VIEW COMPARISON:  CT of the pelvis dated January 07, 2020 FINDINGS: There is an airspace opacity at the left lung base. This is favored to represent an area of atelectasis as seen on the patient's prior CT. The heart size is normal. Aortic calcifications are noted. There is no pneumothorax. No significant pleural effusion. IMPRESSION: No active disease. Electronically Signed   By: Constance Holster M.D.   On: 01/11/2020 00:56     Discharge Exam: Vitals:   01/13/20 2056 01/14/20 0521  BP: 128/62 137/79  Pulse: 69 76  Resp: 18 20  Temp: 97.9 F (36.6 C) 97.9 F (36.6 C)  SpO2: 98% 97%   Vitals:   01/13/20 0508 01/13/20 1356 01/13/20 2056 01/14/20 0521  BP: 124/64 121/70 128/62 137/79  Pulse: 64 77 69 76  Resp: 18 18 18 20   Temp: 98.1 F (36.7 C) 98 F (36.7 C) 97.9 F (36.6 C) 97.9 F (36.6 C)  TempSrc:  Oral Oral Oral  SpO2: 99% 98% 98% 97%  Weight:      Height:  General: Pt is alert, awake, not in acute distress Cardiovascular: RRR, S1/S2 +, no rubs, no gallops Respiratory: CTA bilaterally, no wheezing, no rhonchi Abdominal: Soft, NT, ND, bowel sounds + Extremities: no edema, no cyanosis    The results of significant diagnostics from this hospitalization (including imaging, microbiology, ancillary and laboratory) are listed below for reference.     Microbiology: Recent Results (from the past 240 hour(s))  Urine culture     Status: None   Collection Time: 01/06/20 11:20 PM   Specimen: Urine, Random  Result Value Ref Range Status   Specimen Description URINE, RANDOM  Final   Special Requests ADDED [phone] 01/07/2020  Final    Culture   Final    NO GROWTH Performed at [facility], [place] [address], [place], [place] [address]    Report Status 01/08/2020 FINAL  Final  Blood Culture (routine x 2)     Status: None (Preliminary result)   Collection Time: 01/11/20 12:48 AM   Specimen: BLOOD  Result Value Ref Range Status   Specimen Description BLOOD RIGHT ANTECUBITAL  Final   Special Requests   Final    BOTTLES DRAWN AEROBIC AND ANAEROBIC Blood Culture results may not be optimal due to an excessive volume of blood received in culture bottles   Culture   Final    NO GROWTH 3 DAYS Performed at [facility], [address] [place], [place] [address]    Report Status PENDING  Incomplete  Blood Culture (routine x 2)     Status: None (Preliminary result)   Collection Time: 01/11/20 12:50 AM   Specimen: BLOOD  Result Value Ref Range Status   Specimen Description BLOOD LEFT ANTECUBITAL  Final   Special Requests   Final    BOTTLES DRAWN AEROBIC AND ANAEROBIC Blood Culture results may not be optimal due to an excessive volume of blood received in culture bottles   Culture   Final    NO GROWTH 3 DAYS Performed at [facility], [address] [place], [place] [address]    Report Status PENDING  Incomplete  Urine culture     Status: None   Collection Time: 01/11/20  1:35 AM   Specimen: In/Out Cath Urine  Result Value Ref Range Status   Specimen Description   Final    IN/OUT CATH URINE Performed at [facility], [address], [place], [place] [address]    Special Requests   Final    NONE Performed at [facility], [address], [place], [place] [address]    Culture   Final    NO GROWTH Performed at [facility], [place] [address] [place],  [address]    Report Status 01/12/2020 FINAL  Final  SARS CORONAVIRUS 2 (TAT 6-24 HRS) Nasopharyngeal Nasopharyngeal Swab     Status: None   Collection Time: 01/11/20  4:59 AM   Specimen: Nasopharyngeal Swab  Result Value Ref Range Status   SARS Coronavirus  2 NEGATIVE NEGATIVE Final    Comment: (NOTE) SARS-CoV-2 target nucleic acids are NOT DETECTED. The SARS-CoV-2 RNA is generally detectable in upper and lower respiratory specimens during the acute phase of infection. Negative results do not preclude SARS-CoV-2 infection, do not rule out co-infections with other pathogens, and should not be used as the sole basis for treatment or other patient management decisions. Negative results must be combined with clinical observations, patient history, and epidemiological information. The expected result is Negative. Fact Sheet for Patients: SugarRoll.be Fact Sheet for Healthcare  Providers: https://www.woods-mathews.com/ This test is not yet approved or cleared by the [place] and  has been authorized for detection and/or diagnosis of SARS-CoV-2 by FDA under an Emergency Use Authorization (EUA). This EUA will remain  in effect (meaning this test can be used) for the duration of the COVID-19 declaration under Section 56 4(b)(1) of the Act, 21 U.S.C. section 360bbb-3(b)(1), unless the authorization is terminated or revoked sooner. Performed at [facility], [place] [address] [place], [place] [address]      Labs: BNP (last 3 results) No results for input(s): BNP in the last 8760 hours. Basic Metabolic Panel: Recent Labs  Lab 01/11/20 0049 01/11/20 0735 01/12/20 0432 01/13/20 0644 01/14/20 0510  NA 133*  --  142 141 140  K 3.0*  --  3.3* 3.4* 3.5  CL 99  --  108 107 107  CO2 25  --  25 26 22   GLUCOSE 109*  --  85 88 107*  BUN 8  --  11 11 10   CREATININE 0.76  --  0.79 0.80 0.85  CALCIUM 8.5*  --  8.5* 8.4* 9.0  MG  --  1.9 2.1 2.0 2.0   Liver Function Tests: Recent Labs  Lab 01/11/20 0049 01/12/20 0432 01/13/20 0644  AST 25 20 22   ALT 23 16 16   ALKPHOS 144* 108 101  BILITOT 0.9 0.6 0.5  PROT 7.6 6.4* 6.3*  ALBUMIN 3.4* 2.9* 2.8*   Recent Labs  Lab 01/11/20 0049   LIPASE 24   No results for input(s): AMMONIA in the last 168 hours. CBC: Recent Labs  Lab 01/11/20 0049 01/12/20 0432 01/13/20 0644 01/14/20 0510  WBC 10.8* 5.3 4.2 4.9  NEUTROABS 7.8*  --   --   --   HGB 11.8* 10.3* 10.1* 12.1  HCT 36.7 32.4* 32.2* 38.5  MCV 100.3* 101.6* 102.2* 100.0  PLT 353 335 380 491*   Cardiac Enzymes: No results for input(s): CKTOTAL, CKMB, CKMBINDEX, TROPONINI in the last 168 hours. BNP: Invalid input(s): POCBNP CBG: No results for input(s): GLUCAP in the last 168 hours. D-Dimer No results for input(s): DDIMER in the last 72 hours. Hgb A1c No results for input(s): HGBA1C in the last 72 hours. Lipid Profile No results for input(s): CHOL, HDL, LDLCALC, TRIG, CHOLHDL, LDLDIRECT in the last 72 hours. Thyroid function studies No results for input(s): TSH, T4TOTAL, T3FREE, THYROIDAB in the last 72 hours.  Invalid input(s): FREET3 Anemia work up No results for input(s): VITAMINB12, FOLATE, FERRITIN, TIBC, IRON, RETICCTPCT in the last 72 hours. Urinalysis    Component Value Date/Time   COLORURINE YELLOW 01/11/2020 0135   APPEARANCEUR CLEAR 01/11/2020 0135   LABSPEC 1.011 01/11/2020 0135   PHURINE 5.0 01/11/2020 0135   GLUCOSEU NEGATIVE 01/11/2020 0135   HGBUR SMALL (A) 01/11/2020 0135   BILIRUBINUR NEGATIVE 01/11/2020 0135   KETONESUR NEGATIVE 01/11/2020 0135   PROTEINUR NEGATIVE 01/11/2020 0135   UROBILINOGEN 0.2 02/19/2014 0850   NITRITE NEGATIVE 01/11/2020 0135   LEUKOCYTESUR NEGATIVE 01/11/2020 0135   Sepsis Labs Invalid input(s): PROCALCITONIN,  WBC,  LACTICIDVEN Microbiology Recent Results (from the past 240 hour(s))  Urine culture     Status: None   Collection Time: 01/06/20 11:20 PM   Specimen: Urine, Random  Result Value Ref Range Status   Specimen Description URINE, RANDOM  Final   Special Requests ADDED [phone] 01/07/2020  Final   Culture   Final    NO GROWTH Performed at [facility], [place]  [address], [place], [place]  [address]    Report Status 01/08/2020 FINAL  Final  Blood Culture (routine x 2)     Status: None (Preliminary result)   Collection Time: 01/11/20 12:48 AM   Specimen: BLOOD  Result Value Ref Range Status   Specimen Description BLOOD RIGHT ANTECUBITAL  Final   Special Requests   Final    BOTTLES DRAWN AEROBIC AND ANAEROBIC Blood Culture results may not be optimal due to an excessive volume of blood received in culture bottles   Culture   Final    NO GROWTH 3 DAYS Performed at [facility], [address], [place], [place] [address]    Report Status PENDING  Incomplete  Blood Culture (routine x 2)     Status: None (Preliminary result)   Collection Time: 01/11/20 12:50 AM   Specimen: BLOOD  Result Value Ref Range Status   Specimen Description BLOOD LEFT ANTECUBITAL  Final   Special Requests   Final    BOTTLES DRAWN AEROBIC AND ANAEROBIC Blood Culture results may not be optimal due to an excessive volume of blood received in culture bottles   Culture   Final    NO GROWTH 3 DAYS Performed at [facility], [address] [place], [place] [address]    Report Status PENDING  Incomplete  Urine culture     Status: None   Collection Time: 01/11/20  1:35 AM   Specimen: In/Out Cath Urine  Result Value Ref Range Status   Specimen Description   Final    IN/OUT CATH URINE Performed at [facility], [address] [place], [place] [address]    Special Requests   Final    NONE Performed at [facility] [address], [place], [place] [address]    Culture   Final    NO GROWTH Performed at [facility], [place] [address], [place],  [address]    Report Status 01/12/2020 FINAL  Final  SARS CORONAVIRUS 2 (TAT 6-24 HRS) Nasopharyngeal Nasopharyngeal Swab     Status: None   Collection Time: 01/11/20  4:59 AM   Specimen: Nasopharyngeal Swab  Result Value Ref Range Status   SARS Coronavirus 2 NEGATIVE NEGATIVE Final    Comment: (NOTE) SARS-CoV-2 target nucleic acids are NOT  DETECTED. The SARS-CoV-2 RNA is generally detectable in upper and lower respiratory specimens during the acute phase of infection. Negative results do not preclude SARS-CoV-2 infection, do not rule out co-infections with other pathogens, and should not be used as the sole basis for treatment or other patient management decisions. Negative results must be combined with clinical observations, patient history, and epidemiological information. The expected result is Negative. Fact Sheet for Patients: SugarRoll.be Fact Sheet for Healthcare Providers: https://www.woods-mathews.com/ This test is not yet approved or cleared by the [place] FDA and  has been authorized for detection and/or diagnosis of SARS-CoV-2 by FDA under an Emergency Use Authorization (EUA). This EUA will remain  in effect (meaning this test can be used) for the duration of the COVID-19 declaration under Section 56 4(b)(1) of the Act, 21 U.S.C. section 360bbb-3(b)(1), unless the authorization is terminated or revoked sooner. Performed at [facility], [place] [address], [place],  [address]      Time coordinating discharge: 35 minutes  SIGNED:   Rodena Goldmann, DO [facility] 01/14/2020, 9:04 AM  If 7PM-7AM, please contact night-coverage [email or web address]

## 2020-01-14 NOTE — Progress Notes (Signed)
Pt stated lower back pain 6/10. Pt request to take a shower but refused when advised IV could be wrapped but not removed.

## 2020-01-16 LAB — CULTURE, BLOOD (ROUTINE X 2)
Culture: NO GROWTH
Culture: NO GROWTH

## 2020-01-18 ENCOUNTER — Other Ambulatory Visit: Payer: Self-pay

## 2020-01-18 ENCOUNTER — Encounter: Payer: Self-pay | Admitting: General Surgery

## 2020-01-18 ENCOUNTER — Ambulatory Visit (INDEPENDENT_AMBULATORY_CARE_PROVIDER_SITE_OTHER): Payer: Medicare HMO | Admitting: General Surgery

## 2020-01-18 VITALS — BP 141/75 | HR 77 | Temp 97.5°F | Resp 12 | Ht 66.0 in | Wt 189.6 lb

## 2020-01-18 DIAGNOSIS — K5720 Diverticulitis of large intestine with perforation and abscess without bleeding: Secondary | ICD-10-CM | POA: Diagnosis not present

## 2020-01-18 NOTE — Patient Instructions (Addendum)
Colonoscopy in 6-8 weeks after flare. Plan to see me in about 2 months.   Diverticulosis/ Diverticulitis Information and Diet:   Diverticulosis is a condition in which small, bulging pouches (diverticuli) form inside the lower part of the intestine, usually in the colon. Constipation and straining during bowel movements can worsen the condition. A diet rich in fiber can help keep stools soft and prevent inflammation.  Diverticulitis occurs when the pouches in the colon become infected or inflamed. Dietary changes can help the colon heal.  Fiber is an important part of the diet for patients with diverticulosis. A high-fiber diet softens and gives bulk to the stool, allowing it to pass quickly and easily.  Diet for Diverticulosis Eat a high-fiber diet when you have diverticulosis. Fiber softens the stool and helps prevent constipation. It also can help decrease pressure in the colon and help prevent flare-ups of diverticulitis.  High-fiber foods include:  Beans and legumes Bran, whole wheat bread and whole grain cereals such as oatmeal Brown and wild rice Fruits such as apples, bananas and pears Vegetables such as broccoli, carrots, corn and squash Whole wheat pasta If you currently don't have a diet high in fiber, you should add fiber gradually. This helps avoid bloating and abdominal discomfort. The target is to eat 25 to 30 grams of fiber daily. Drink at least 8 cups of fluid daily. Fluid will help soften your stool. Exercise also promotes bowel movement and helps prevent constipation.  When the colon is not inflamed, eat popcorn, nuts and seeds as tolerated.  Diet for Diverticulitis During flare ups of diverticulitis, follow a clear liquid diet. Your doctor will let you know when to progress from clear liquids to low fiber solids and then back to your normal diet.  A clear liquid diet means no solid foods. Juices should have no pulp. During the clear liquid diet, you may  consume:  Broth Clear juices such as apple, cranberry and grape. (Avoid orange juice) Jell-O Popsicles  When you're able to eat solid food, choose low fiber foods while healing. Low fiber foods include:  Canned or cooked fruit without seeds or skin, such as applesauce and melon Canned or well cooked vegetables without seeds and skin Dairy products such as cheese, milk and yogurt Eggs Low-fiber cereal Meat that is ground or tender and well cooked Pasta White bread and white rice AVOID RED MEAT WHILE YOU HAVE AN ACTIVE FLARE.  AVOID NSAIDS, IBUPROFEN, ALEVE, ASPIRIN WHILE YOU HAVE AN ACTIVE FLARE.  EXERCISE AS WELL AS SMOKING CESSATION CAN HELP PREVENT RECURRENCES.   After symptoms improve, usually within two to four days, you may add 5 to 15 grams of fiber a day back into your diet. Resume your high fiber diet when you no longer have symptoms.

## 2020-01-18 NOTE — Progress Notes (Signed)
[facility] Note   HPI:  74 y.o. Female presents to clinic for follow-up evaluation of her diverticulitis with intramural abscess. She says she is feeling really well. She has been eating and drinking and her pain is resolved. She no longer has the bladder spasms. She says she is happy overall. Her son comes with her today.   Review of Systems:  No fever or chills Pain resolving No spasms All other review of systems: otherwise negative   Vital Signs:  BP (!) 141/75   Pulse 77   Temp (!) 97.5 F (36.4 C) (Oral)   Resp 12   Ht 5\' 6"  (1.676 m)   Wt 189 lb 9.6 oz (86 kg)   SpO2 95%   BMI 30.60 kg/m    Physical Exam:  Physical Exam Vitals reviewed.  Constitutional:      Appearance: She is normal weight.  HENT:     Head: Normocephalic.     Mouth/Throat:     Mouth: Mucous membranes are moist.  Cardiovascular:     Rate and Rhythm: Normal rate.  Pulmonary:     Effort: Pulmonary effort is normal.  Abdominal:     General: There is no distension.     Palpations: Abdomen is soft.     Tenderness: There is no abdominal tenderness.  Skin:    General: Skin is warm.  Neurological:     General: No focal deficit present.     Mental Status: She is alert and oriented to person, place, and time.  Psychiatric:        Mood and Affect: Mood normal.        Behavior: Behavior normal.        Thought Content: Thought content normal.        Judgment: Judgment normal.    Assessment:  74 y.o. yo Female with repeated episodes of diverticulitis in the past and reporting episodes multiple times some years and then she will go extended periods without an episode. She is feeling better now. Her last colonoscopy was in 2018, and she has likely had 6-8 episodes since that time. She needs a repeat colonoscopy to assess if she is developing any signs of stricture of narrowing and also to assess and confirm that this was an intramural abscess and nothing more concerning.   Plan:  -  Refer to Dr. Gala Romney for colonoscopy 6-8 weeks after her diverticulitis, Early May should be sufficient time to wait.  - Follow up with to discuss her options, I have told her she does not have to have surgery but that at some point the risk and issues with her diverticulitis may require surgery. She is very concerned about surgery and has baseline anxiety/ panic.   Future Appointments  Date Time Provider [place]  03/19/2020 10:30 AM Virl Cagey, MD [facility] None    All of the above recommendations were discussed with the patient and patient's family, and all of patient's and family's questions were answered to their expressed satisfaction.  Curlene Labrum, MD [facility] [address] [place], [place] [address] [phone] (office)

## 2020-01-22 DIAGNOSIS — Z6830 Body mass index (BMI) 30.0-30.9, adult: Secondary | ICD-10-CM | POA: Diagnosis not present

## 2020-01-22 DIAGNOSIS — E039 Hypothyroidism, unspecified: Secondary | ICD-10-CM | POA: Diagnosis not present

## 2020-01-22 DIAGNOSIS — I10 Essential (primary) hypertension: Secondary | ICD-10-CM | POA: Diagnosis not present

## 2020-01-22 DIAGNOSIS — K5792 Diverticulitis of intestine, part unspecified, without perforation or abscess without bleeding: Secondary | ICD-10-CM | POA: Diagnosis not present

## 2020-01-22 DIAGNOSIS — Z299 Encounter for prophylactic measures, unspecified: Secondary | ICD-10-CM | POA: Diagnosis not present

## 2020-01-22 DIAGNOSIS — R110 Nausea: Secondary | ICD-10-CM | POA: Diagnosis not present

## 2020-01-22 DIAGNOSIS — Z09 Encounter for follow-up examination after completed treatment for conditions other than malignant neoplasm: Secondary | ICD-10-CM | POA: Diagnosis not present

## 2020-01-25 DIAGNOSIS — H524 Presbyopia: Secondary | ICD-10-CM | POA: Diagnosis not present

## 2020-01-25 DIAGNOSIS — H538 Other visual disturbances: Secondary | ICD-10-CM | POA: Diagnosis not present

## 2020-01-25 DIAGNOSIS — H2511 Age-related nuclear cataract, right eye: Secondary | ICD-10-CM | POA: Diagnosis not present

## 2020-01-31 ENCOUNTER — Encounter: Payer: Self-pay | Admitting: Gastroenterology

## 2020-01-31 ENCOUNTER — Ambulatory Visit (INDEPENDENT_AMBULATORY_CARE_PROVIDER_SITE_OTHER): Payer: Medicare Other | Admitting: Gastroenterology

## 2020-01-31 ENCOUNTER — Other Ambulatory Visit: Payer: Self-pay

## 2020-01-31 DIAGNOSIS — R197 Diarrhea, unspecified: Secondary | ICD-10-CM | POA: Diagnosis not present

## 2020-01-31 DIAGNOSIS — Z8719 Personal history of other diseases of the digestive system: Secondary | ICD-10-CM

## 2020-01-31 MED ORDER — PEG 3350-KCL-NA BICARB-NACL 420 G PO SOLR: 4000.0000 mL | ORAL | 0 refills | Status: DC

## 2020-01-31 NOTE — Patient Instructions (Addendum)
We will get you scheduled for colonoscopy in the near future.  Continue to monitor your abominal pain. If your abdominal pain worsens at all, please let me know immendiately. We will need to repeat your CT scan to ensure your diverticulitis hasn't returned.   For now, I would back down to a low fiber/low residue diet for the next couple of days.  Continue to monitor your diarrhea.  If this becomes persistent, please let me know.  We will see you back after your colonoscopy.  Aliene Altes, PA-C [facility]

## 2020-01-31 NOTE — Progress Notes (Signed)
Referring Provider: Glenda Chroman, MD Primary Care Physician:  Glenda Chroman, MD Primary GI Physician: Dr. Gala Romney  Chief Complaint  Patient presents with  . Diverticulitis    was diagnosed at [facility] 3/11. Saw Dr. Constance Haw 3/18  . Abdominal Pain    across lower abd, no longer having bladder spasms   HPI:   Alexa Wilson is a 74 y.o. female with a history significant for GERD, alternating constipation and diarrhea likely secondary to IBS, and diverticulitis. Colonoscopy 02/11/2017 which found a diverticulosis in the sigmoid colon and descending colon, 2 polyps measuring between 4-6 mm in the ascending colon, otherwise normal.  Surgical pathology found the polyps to be tubular adenoma.  Recommended repeat colonoscopy in 5 years (2023).  Last seen in our office in November 2018 for alternating constipation and diarrhea with history of IBS.  Difficult to get clear history from patient.  Ultimately, she was advised to stop stool softeners daily.  Advised to use Dulcolax suppository if constipated.  Bentyl up to 3 times daily as needed for loose stools.  Patient was admitted to the hospital from 01/11/2020-01/14/20 for diverticulitis of the large intestine with abscess.  Initially diagnosed with 2 areas of uncomplicated diverticulitis in the sigmoid colon on 01/07/2020 with plans for outpatient management with Augmentin.  Symptoms progressed so she presented again to the ED and was found to have improvement in the proximal area of diverticulitis but the more distal area had progressed with a pericolonic abscess. She was treated with IV Zosyn while inpatient.  General surgery consulted with no plans for intervention during hospitalization but would follow-up as an outpatient.  She was prescribed Augmentin at discharge x10 days to complete 14-day course of treatment.  White count was normal at the time of discharge.  Patient saw Dr. Constance Haw on 01/18/2020 for follow-up and was clinically doing very well.  As  her last colonoscopy was in 2018, recommended repeat colonoscopy to assess for any signs of stricture or narrowing and also to assess and confirm that this was truly an intramural abscess and nothing more concerning.  She did not recommend surgery at that time but discussed that at some point the risk and issues with her diverticulitis may require surgery.  Today:  Completed antibiotics for diverticulitis about 3-4 days ago. She was having abdominal pain across her lower abdomen. Also with bladder spasms. Bladder spasms have resolved.  3-4 days ago, patient had LLQ pain that radiated to her groin.  This has been improving over the last 1-2 days and states it is essentially resolved today.  States she alternates between diarrhea and constipation. Stool was mushy/watery today. Had minimal amount of pain prior to having a BM today.  So far, 3 BMs today. Thinks this is related to being "panicky" today.  History of IBS. Prior to diverticulitis, she was having regular daily BMs, occasional constipation, rare diarrhea. Notes she is very worried about having to have surgery. Last stool softer was 2 days ago, uses them as needed.  Prior to today, she had not been having diarrhea.   No blood in the stool. No black stools. No nausea or vomiting. No GERD symptoms or dysphagia.  No fever. No lightheadedness, dizziness, pre-syncope, or syncope.   Patient reports having diverticulitis many times over the last several years. Difficult to get a clear history on timing and number of episodes.  Reports panic disorder since the 1980s.  States it has never left her.  If she ends  up having to have surgery, states she wants to have this in [place].    Past Medical History:  Diagnosis Date  . Agoraphobia with panic disorder 1982  . Brown recluse spider bite   . Constipation   . Diverticulitis   . GERD (gastroesophageal reflux disease)   . Hyperlipidemia   . Lyme disease   . Migraines    "don't have them since  menopause" (09/23/2016)  . Panic disorder   . Shingles     Past Surgical History:  Procedure Laterality Date  . APPENDECTOMY  1964  . CHOLECYSTECTOMY OPEN  1966  . COLONOSCOPY  2012   RMR: 1. External hemorrhoids, otherwise normal rectum. 2. Left-sided diverticula status post snare polypectomy left colon polyp. Remainder of the colonic mucosa and terminal ileum mucoa appeared normal.   . COLONOSCOPY N/A 02/11/2017   Procedure: COLONOSCOPY;  Surgeon: Daneil Dolin, MD; diverticulosis in the sigmoid and descending colon, 2 tubular adenomas, otherwise normal.  Repeat in 5 years.  Marland Kitchen DILATION AND CURETTAGE OF UTERUS    . TONSILLECTOMY  1961    Current Outpatient Medications  Medication Sig Dispense Refill  . acetaminophen (TYLENOL) 500 MG tablet Take 500 mg by mouth every 6 (six) hours as needed for mild pain or moderate pain.    Marland Kitchen alprazolam (XANAX) 2 MG tablet Take 1-2 mg by mouth every 8 (eight) hours. Take 1/2 tablet 8 am and 8 pm then take 1 tablet at 2 pm  Taking 4 mg daily.    Marland Kitchen docusate sodium (STOOL SOFTENER) 100 MG capsule Take 1 capsule (100 mg total) by mouth 2 (two) times daily. (Patient taking differently: Take 100 mg by mouth daily. ) 60 capsule 0  . levothyroxine (SYNTHROID) 25 MCG tablet Take 25 mcg by mouth daily.    . metoprolol succinate (TOPROL-XL) 25 MG 24 hr tablet Take 0.5 tablets (12.5 mg total) by mouth daily. (Patient taking differently: Take 12.5 mg by mouth at bedtime. ) 45 tablet 0  . rosuvastatin (CRESTOR) 10 MG tablet Take 20 mg by mouth at bedtime.     . polyethylene glycol-electrolytes (TRILYTE) 420 g solution Take 4,000 mLs by mouth as directed. 4000 mL 0   No current facility-administered medications for this visit.    Allergies as of 01/31/2020 - Review Complete 01/31/2020  Allergen Reaction Noted  . Ciprofloxacin Nausea Only   . Codeine Other (See Comments)   . Metronidazole Nausea Only   . Morphine and related Nausea And Vomiting 05/11/2013  .  Other Other (See Comments) 07/14/2012    Family History  Problem Relation Age of Onset  . Diabetes Mother   . Hypertension Mother   . Heart attack Mother   . Cancer Father        Melanoma   . Diabetes Brother   . Diabetes Son   . Colon cancer Neg Hx     Social History   Socioeconomic History  . Marital status: Widowed    Spouse name: Not on file  . Number of children: 5  . Years of education: Not on file  . Highest education level: Not on file  Occupational History  . Not on file  Tobacco Use  . Smoking status: Never Smoker  . Smokeless tobacco: Never Used  Substance and Sexual Activity  . Alcohol use: No    Alcohol/week: 0.0 standard drinks  . Drug use: No  . Sexual activity: Never  Other Topics Concern  . Not on file  Social History  Narrative  . Not on file   Social Determinants of Health   Financial Resource Strain:   . Difficulty of Paying Living Expenses:   Food Insecurity:   . Worried About Charity fundraiser in the Last Year:   . Arboriculturist in the Last Year:   Transportation Needs:   . Film/video editor (Medical):   Marland Kitchen Lack of Transportation (Non-Medical):   Physical Activity:   . Days of Exercise per Week:   . Minutes of Exercise per Session:   Stress:   . Feeling of Stress :   Social Connections:   . Frequency of Communication with Friends and Family:   . Frequency of Social Gatherings with Friends and Family:   . Attends Religious Services:   . Active Member of Clubs or Organizations:   . Attends Archivist Meetings:   Marland Kitchen Marital Status:     Review of Systems: Gen: See HPI CV: Denies chest pain or palpitations. Resp: Denies shortness of breath or cough. GI: See HPI Derm: Denies rash Psych:  See HPI  Heme: See HPI.  Physical Exam: BP 136/71   Pulse 71   Temp (!) 97.5 F (36.4 C) (Oral)   Ht 5\' 6"  (1.676 m)   Wt 193 lb 3.2 oz (87.6 kg)   BMI 31.18 kg/m  General:   Alert and oriented. No distress noted. Pleasant  and cooperative.  Head:  Normocephalic and atraumatic. Eyes:  Conjuctiva clear without scleral icterus. Heart:  S1, S2 present without murmurs appreciated. Lungs:  Clear to auscultation bilaterally. No wheezes, rales, or rhonchi. No distress.  Abdomen:  +BS, soft, and non-distended.  Very mild tenderness to palpation in the left lower quadrant/left pelvic area.  No rebound or guarding. No HSM or masses noted. Msk:  Symmetrical without gross deformities. Normal posture. Extremities:  Without edema. Neurologic:  Alert and  oriented x4 Psych: Anxious mood

## 2020-02-02 DIAGNOSIS — Z8719 Personal history of other diseases of the digestive system: Secondary | ICD-10-CM | POA: Insufficient documentation

## 2020-02-02 DIAGNOSIS — R197 Diarrhea, unspecified: Secondary | ICD-10-CM | POA: Insufficient documentation

## 2020-02-02 NOTE — Assessment & Plan Note (Signed)
74 year old female with history of IBS with alternating constipation and diarrhea and recently with sigmoid diverticulitis complicated by abscess requiring inpatient treatment with Zosyn for 4 days and discharged with 10 days of Augmentin. Symptoms related to diverticulitis are much improved only with very mild LLQ pain at this time as discussed below. Today she has started with watery stools with a total of 3 BMs today with some cramping prior to BMs. States she had been having soft/formed BMs prior to this with use of stool softeners as needed but rare diarrhea. Denies bright red blood per rectum or melena.   No intervention or medication changes at this time. As diarrhea has just started she was advised to continue to monitor and let me know if this persists. With recent hospitalization and antibiotics, would complete stool studies to rule out infectious diarrhea. It is possible this is just a flare of her IBS as she notes being very worried about her episodes of diverticulitis and possibly having to have surgery in the future if she continues to have flares.

## 2020-02-02 NOTE — Assessment & Plan Note (Addendum)
74 year old female with recent history of sigmoid diverticulitis with pericolonic abscess requiring inpatient treatment from 01/11/2020-01/14/2020.  At discharge, she was provided 10 days of Augmentin.  She reports completion of antibiotics 3-4 days ago.  Significant improvement with almost complete resolution of abdominal pain.  She does note 3 to 4 days ago she had mild left lower quadrant pain radiating to her left groin but this has also almost completely resolved.  Reports history of several episodes of diverticulitis in the past.  She had follow-up with Dr. Constance Haw on 01/18/2020.  Dr. Constance Haw recommended repeat colonoscopy to assess for any signs of stricture or narrowing and also assess and confirm that the abscess was truly an intramural abscess and nothing more concerning.  No recommendations for surgery at that time but discussed at some point the risk and issues with diverticulitis may require surgery.  Patient's last colonoscopy was in 2018 with diverticulosis in the sigmoid and descending colon and 2 tubular adenomas.  Recommendations to repeat in 2023.  With acute diverticulitis and colonoscopy greater than 1 year ago, she does need repeat colonoscopy at this time.  As her abdominal pain is much improved with very mild tenderness palpation in the left lower quadrant, I feel it is okay to go ahead and pursue colonoscopy in 6-8 weeks.  Patient was advised to continue to monitor her abdominal pain and let me know immediately if she has any worsening of her abdominal pain.  Proceed with TCS with propofol with Dr. Gala Romney in the near future. The risks, benefits, and alternatives have been discussed in detail with patient. They have stated understanding and desire to proceed.  Propofol due to history of panic disorder and Xanax 4 mg daily. Continue to monitor abdominal pain.  If abdominal pain worsens, she is advised to let us know immediately.  Would need to pursue CT to evaluate for recurrence of  diverticulitis. Follow-up after colonoscopy.  Of note, patient desires to be seen in [place] if surgery is required.

## 2020-03-04 DIAGNOSIS — Z23 Encounter for immunization: Secondary | ICD-10-CM | POA: Diagnosis not present

## 2020-03-05 ENCOUNTER — Telehealth: Payer: Self-pay | Admitting: Cardiology

## 2020-03-05 DIAGNOSIS — E785 Hyperlipidemia, unspecified: Secondary | ICD-10-CM

## 2020-03-05 DIAGNOSIS — R002 Palpitations: Secondary | ICD-10-CM

## 2020-03-05 MED ORDER — METOPROLOL SUCCINATE ER 25 MG PO TB24: 12.5000 mg | ORAL_TABLET | Freq: Every day | ORAL | 0 refills | Status: DC

## 2020-03-05 NOTE — Telephone Encounter (Signed)
New message   Patient needs a new prescription for metoprolol succinate (TOPROL-XL) 25 MG 24 hr tablet sent to [facility]

## 2020-03-06 ENCOUNTER — Telehealth: Payer: Self-pay

## 2020-03-06 NOTE — Telephone Encounter (Signed)
Patient had questions regarding bill. Number provided to call billing [phone]. Cancelled follow up 03/19/20 with Dr.Bridges -Colonoscopy scheduled in July- patient to call and reschedule after colonoscopy has been done.

## 2020-03-08 DIAGNOSIS — Z299 Encounter for prophylactic measures, unspecified: Secondary | ICD-10-CM | POA: Diagnosis not present

## 2020-03-08 DIAGNOSIS — E039 Hypothyroidism, unspecified: Secondary | ICD-10-CM | POA: Diagnosis not present

## 2020-03-08 DIAGNOSIS — R1032 Left lower quadrant pain: Secondary | ICD-10-CM | POA: Diagnosis not present

## 2020-03-08 DIAGNOSIS — I10 Essential (primary) hypertension: Secondary | ICD-10-CM | POA: Diagnosis not present

## 2020-03-08 DIAGNOSIS — F419 Anxiety disorder, unspecified: Secondary | ICD-10-CM | POA: Diagnosis not present

## 2020-03-08 DIAGNOSIS — Z6831 Body mass index (BMI) 31.0-31.9, adult: Secondary | ICD-10-CM | POA: Diagnosis not present

## 2020-03-08 DIAGNOSIS — K5792 Diverticulitis of intestine, part unspecified, without perforation or abscess without bleeding: Secondary | ICD-10-CM | POA: Diagnosis not present

## 2020-03-18 DIAGNOSIS — K219 Gastro-esophageal reflux disease without esophagitis: Secondary | ICD-10-CM | POA: Diagnosis not present

## 2020-03-18 DIAGNOSIS — Z6831 Body mass index (BMI) 31.0-31.9, adult: Secondary | ICD-10-CM | POA: Diagnosis not present

## 2020-03-18 DIAGNOSIS — W57XXXA Bitten or stung by nonvenomous insect and other nonvenomous arthropods, initial encounter: Secondary | ICD-10-CM | POA: Diagnosis not present

## 2020-03-18 DIAGNOSIS — Z299 Encounter for prophylactic measures, unspecified: Secondary | ICD-10-CM | POA: Diagnosis not present

## 2020-03-18 DIAGNOSIS — I10 Essential (primary) hypertension: Secondary | ICD-10-CM | POA: Diagnosis not present

## 2020-03-18 DIAGNOSIS — M1990 Unspecified osteoarthritis, unspecified site: Secondary | ICD-10-CM | POA: Diagnosis not present

## 2020-03-19 ENCOUNTER — Ambulatory Visit: Payer: Self-pay | Admitting: General Surgery

## 2020-03-19 DIAGNOSIS — Z23 Encounter for immunization: Secondary | ICD-10-CM | POA: Diagnosis not present

## 2020-03-27 DIAGNOSIS — Z9049 Acquired absence of other specified parts of digestive tract: Secondary | ICD-10-CM | POA: Diagnosis not present

## 2020-03-27 DIAGNOSIS — S4991XA Unspecified injury of right shoulder and upper arm, initial encounter: Secondary | ICD-10-CM | POA: Diagnosis not present

## 2020-03-27 DIAGNOSIS — S299XXA Unspecified injury of thorax, initial encounter: Secondary | ICD-10-CM | POA: Diagnosis not present

## 2020-03-27 DIAGNOSIS — M25511 Pain in right shoulder: Secondary | ICD-10-CM | POA: Diagnosis not present

## 2020-03-27 DIAGNOSIS — M79601 Pain in right arm: Secondary | ICD-10-CM | POA: Diagnosis not present

## 2020-03-27 DIAGNOSIS — S0990XA Unspecified injury of head, initial encounter: Secondary | ICD-10-CM | POA: Diagnosis not present

## 2020-03-27 DIAGNOSIS — S199XXA Unspecified injury of neck, initial encounter: Secondary | ICD-10-CM | POA: Diagnosis not present

## 2020-04-04 ENCOUNTER — Other Ambulatory Visit: Payer: Self-pay | Admitting: Cardiology

## 2020-04-04 DIAGNOSIS — R002 Palpitations: Secondary | ICD-10-CM

## 2020-04-04 DIAGNOSIS — E785 Hyperlipidemia, unspecified: Secondary | ICD-10-CM

## 2020-04-04 MED ORDER — METOPROLOL SUCCINATE ER 25 MG PO TB24: 12.5000 mg | ORAL_TABLET | Freq: Every day | ORAL | 0 refills | Status: DC

## 2020-04-04 NOTE — Telephone Encounter (Signed)
*  STAT* If patient is at the pharmacy, call can be transferred to refill team.   1. Which medications need to be refilled? (please list name of each medication and dose if known) metoprolol succinate (TOPROL-XL) 25 MG 24 hr tablet  2. Which pharmacy/location (including street and city if local pharmacy) is medication to be sent to? [place], [address] [place]  3. Do they need a 30 day or 90 day supply? 90   Patient scheduled with Dr. Stanford Breed on 07/04/20

## 2020-04-16 DIAGNOSIS — D1801 Hemangioma of skin and subcutaneous tissue: Secondary | ICD-10-CM | POA: Diagnosis not present

## 2020-04-16 DIAGNOSIS — D821 Di George's syndrome: Secondary | ICD-10-CM | POA: Diagnosis not present

## 2020-04-16 DIAGNOSIS — L304 Erythema intertrigo: Secondary | ICD-10-CM | POA: Diagnosis not present

## 2020-04-16 DIAGNOSIS — L570 Actinic keratosis: Secondary | ICD-10-CM | POA: Diagnosis not present

## 2020-04-24 ENCOUNTER — Telehealth: Payer: Self-pay

## 2020-04-24 NOTE — Telephone Encounter (Signed)
Endo scheduler said pt called her yesterday to cancel TCS for 05/09/20 d/t son is having surgery. Endo scheduler advised her to call our office. Pt told her she had already called office.  Called pt, states she had called office and left message. She wants to cancel TCS d/t son is having surgery. Doesn't want to reschedule TCS. Said she is not having any problems at this time and isn't due for TCS. She will call office if she has any problems. Endo scheduler informed.  FYI to [facility].

## 2020-04-24 NOTE — Telephone Encounter (Signed)
Noted  

## 2020-05-07 ENCOUNTER — Encounter (HOSPITAL_COMMUNITY): Payer: Medicare Other

## 2020-05-07 ENCOUNTER — Other Ambulatory Visit (HOSPITAL_COMMUNITY): Payer: Medicare Other

## 2020-05-09 ENCOUNTER — Ambulatory Visit (HOSPITAL_COMMUNITY): Admit: 2020-05-09 | Payer: Medicare Other | Admitting: Internal Medicine

## 2020-05-09 ENCOUNTER — Encounter (HOSPITAL_COMMUNITY): Payer: Self-pay

## 2020-05-09 SURGERY — COLONOSCOPY WITH PROPOFOL
Anesthesia: Monitor Anesthesia Care

## 2020-05-23 DIAGNOSIS — R945 Abnormal results of liver function studies: Secondary | ICD-10-CM | POA: Diagnosis not present

## 2020-05-30 ENCOUNTER — Other Ambulatory Visit: Payer: Self-pay | Admitting: Cardiology

## 2020-05-30 DIAGNOSIS — R002 Palpitations: Secondary | ICD-10-CM

## 2020-05-30 DIAGNOSIS — E785 Hyperlipidemia, unspecified: Secondary | ICD-10-CM

## 2020-06-01 ENCOUNTER — Encounter (HOSPITAL_COMMUNITY): Payer: Self-pay

## 2020-06-01 ENCOUNTER — Emergency Department (HOSPITAL_COMMUNITY): Payer: Medicare HMO

## 2020-06-01 ENCOUNTER — Emergency Department (HOSPITAL_COMMUNITY)
Admission: EM | Admit: 2020-06-01 | Discharge: 2020-06-01 | Disposition: A | Discharge status: Home / Self Care | Payer: Medicare HMO | Attending: Emergency Medicine | Admitting: Emergency Medicine

## 2020-06-01 DIAGNOSIS — K5732 Diverticulitis of large intestine without perforation or abscess without bleeding: Secondary | ICD-10-CM | POA: Diagnosis not present

## 2020-06-01 DIAGNOSIS — K449 Diaphragmatic hernia without obstruction or gangrene: Secondary | ICD-10-CM | POA: Diagnosis not present

## 2020-06-01 DIAGNOSIS — K219 Gastro-esophageal reflux disease without esophagitis: Secondary | ICD-10-CM | POA: Diagnosis not present

## 2020-06-01 DIAGNOSIS — R1032 Left lower quadrant pain: Secondary | ICD-10-CM | POA: Insufficient documentation

## 2020-06-01 DIAGNOSIS — R3915 Urgency of urination: Secondary | ICD-10-CM | POA: Diagnosis not present

## 2020-06-01 DIAGNOSIS — K5792 Diverticulitis of intestine, part unspecified, without perforation or abscess without bleeding: Secondary | ICD-10-CM

## 2020-06-01 DIAGNOSIS — R197 Diarrhea, unspecified: Secondary | ICD-10-CM | POA: Insufficient documentation

## 2020-06-01 DIAGNOSIS — R1030 Lower abdominal pain, unspecified: Secondary | ICD-10-CM

## 2020-06-01 DIAGNOSIS — R1011 Right upper quadrant pain: Secondary | ICD-10-CM | POA: Diagnosis not present

## 2020-06-01 DIAGNOSIS — R350 Frequency of micturition: Secondary | ICD-10-CM | POA: Diagnosis not present

## 2020-06-01 DIAGNOSIS — K760 Fatty (change of) liver, not elsewhere classified: Secondary | ICD-10-CM | POA: Diagnosis not present

## 2020-06-01 LAB — URINALYSIS, ROUTINE W REFLEX MICROSCOPIC
Bacteria, UA: NONE SEEN
Bilirubin Urine: NEGATIVE
Glucose, UA: NEGATIVE mg/dL
Ketones, ur: NEGATIVE mg/dL
Leukocytes,Ua: NEGATIVE
Nitrite: NEGATIVE
Protein, ur: NEGATIVE mg/dL
Specific Gravity, Urine: 1.011 (ref 1.005–1.030)
pH: 5 (ref 5.0–8.0)

## 2020-06-01 LAB — CBC
HCT: 43.1 % (ref 36.0–46.0)
Hemoglobin: 13.4 g/dL (ref 12.0–15.0)
MCH: 32.3 pg (ref 26.0–34.0)
MCHC: 31.1 g/dL (ref 30.0–36.0)
MCV: 103.9 fL — ABNORMAL HIGH (ref 80.0–100.0)
Platelets: 286 10*3/uL (ref 150–400)
RBC: 4.15 MIL/uL (ref 3.87–5.11)
RDW: 13.2 % (ref 11.5–15.5)
WBC: 10 10*3/uL (ref 4.0–10.5)
nRBC: 0 % (ref 0.0–0.2)

## 2020-06-01 LAB — COMPREHENSIVE METABOLIC PANEL
ALT: 20 U/L (ref 0–44)
AST: 40 U/L (ref 15–41)
Albumin: 4.2 g/dL (ref 3.5–5.0)
Alkaline Phosphatase: 79 U/L (ref 38–126)
Anion gap: 10 (ref 5–15)
BUN: 10 mg/dL (ref 8–23)
CO2: 24 mmol/L (ref 22–32)
Calcium: 9.5 mg/dL (ref 8.9–10.3)
Chloride: 107 mmol/L (ref 98–111)
Creatinine, Ser: 0.84 mg/dL (ref 0.44–1.00)
GFR calc Af Amer: >60 mL/min (ref >60)
GFR calc non Af Amer: >60 mL/min (ref >60)
Glucose, Bld: 100 mg/dL — ABNORMAL HIGH (ref 70–99)
Potassium: 4.1 mmol/L (ref 3.5–5.1)
Sodium: 141 mmol/L (ref 135–145)
Total Bilirubin: 1.2 mg/dL (ref 0.3–1.2)
Total Protein: 7.5 g/dL (ref 6.5–8.1)

## 2020-06-01 LAB — LIPASE, BLOOD: Lipase: 38 U/L (ref 11–51)

## 2020-06-01 MED ORDER — SODIUM CHLORIDE 0.9% FLUSH: 3.0000 mL | Freq: Once | INTRAVENOUS | Status: DC

## 2020-06-01 MED ORDER — IOHEXOL 300 MG/ML  SOLN
100.0000 mL | Freq: Once | INTRAMUSCULAR | Status: AC | PRN
  Administered 2020-06-01: 100 mL via INTRAVENOUS

## 2020-06-01 MED ORDER — HYDROMORPHONE HCL 1 MG/ML IJ SOLN
0.2500 mg | Freq: Once | INTRAMUSCULAR | Status: AC
  Administered 2020-06-01: 0.25 mg via INTRAVENOUS
  Filled 2020-06-01: qty 1

## 2020-06-01 MED ORDER — AMOXICILLIN-POT CLAVULANATE 875-125 MG PO TABS: 1.0000 | ORAL_TABLET | Freq: Two times a day (BID) | ORAL | 0 refills | Status: DC

## 2020-06-01 MED ORDER — SODIUM CHLORIDE 0.9 % IV BOLUS
250.0000 mL | Freq: Once | INTRAVENOUS | Status: AC
  Administered 2020-06-01: 250 mL via INTRAVENOUS

## 2020-06-01 MED ORDER — HYDROCODONE-ACETAMINOPHEN 5-325 MG PO TABS: 1.0000 | ORAL_TABLET | Freq: Four times a day (QID) | ORAL | 0 refills | Status: DC | PRN

## 2020-06-01 NOTE — ED Notes (Signed)
Patient verbalizes understanding of discharge instructions. Opportunity for questioning and answers were provided. Armband removed by staff, pt discharged from ED ambulatory by self\  

## 2020-06-01 NOTE — ED Provider Notes (Signed)
Medical screening examination/treatment/procedure(s) were conducted as a shared visit with non-physician practitioner(s) and myself.  I personally evaluated the patient during the encounter. Briefly, the patient is a 74 y.o. female patient here for left lower quadrant abdominal pain.  Acute diverticulitis on CT scan.  Tender in this area.  No fever, no white count.  Overall uncomplicated diverticulitis but patient does have extreme discomfort.  Lives at home with her younger son but she does most of the housework.  She does not feel much better after IV fluids and IV pain medicine.  She is contemplating about whether or not she feels comfortable enough to go home because she still feels very uncomfortable.  Will reengage care and may need admission for antibiotics and further pain control.  This chart was dictated using voice recognition software.  Despite best efforts to proofread,  errors can occur which can change the documentation meaning.     EKG Interpretation None           Lennice Sites, DO 06/01/20 1544

## 2020-06-01 NOTE — Discharge Instructions (Signed)
Please pick up antibiotics and take as prescribed. I have also prescribed pain medication to take AS NEEDED for severe pain however you can take OTC Tylenol if you are hesitant about the medication being too strong.   Drink plenty of fluids to stay hydrated  Follow up with your PCP regarding your ED visit and for recheck after you are finished with your antibiotics in 1-2 weeks  Return to the ED IMMEDIATELY for any worsening symptoms including worsening pain, fevers > 100.4, excessive vomiting, or any other new/concerning symptoms

## 2020-06-01 NOTE — ED Provider Notes (Signed)
[facility] EMERGENCY DEPARTMENT Provider Note   CSN: [id number] Arrival date & time: 06/01/20  1015     History Chief Complaint  Patient presents with  . Abdominal Pain    Alexa Wilson is a 74 y.o. female with history of hyperlipidemia, migraine headaches, panic disorder with agoraphobia, diverticulitis complicated by abscess presents for evaluation of acute onset, persistent lower abdominal pain since yesterday.  Reports the pain is throbbing, worsens with any movement including flexion at the hips, worse on the left side.  She notes associated urinary urgency and frequency and states she has only been able to pass small amounts of urine.  She denies dysuria or hematuria.  Just prior to my assessment she was able to urinate "a good amount" in the ED and was able to provide a urine sample. She does note she has had some constipation and more recently passing small amounts of thin, non-bloody stool. She feels as though her abdomen is protuberant. She denies fevers, chest pain, shortness of breath.  Has not tried anything for her symptoms.  States that this feels similar to when she had diverticulitis previously.     The history is provided by the patient.       Past Medical History:  Diagnosis Date  . Agoraphobia with panic disorder 1982  . Brown recluse spider bite   . Constipation   . Diverticulitis   . GERD (gastroesophageal reflux disease)   . Hyperlipidemia   . Lyme disease   . Migraines    "don't have them since menopause" (09/23/2016)  . Panic disorder   . Shingles     Patient Active Problem List   Diagnosis Date Noted  . History of colonic diverticulitis 02/02/2020  . Diarrhea 02/02/2020  . Diverticulitis of intestine with abscess 01/11/2020  . Acute hypokalemia 09/22/2016  . Hyperlipidemia 11/15/2015  . Constipation 04/09/2015  . Palpitations 08/11/2013  . Chest pressure 07/15/2013  . Right arm weakness 07/15/2013  . Sigmoid  diverticulitis 07/14/2012  . DYSPEPSIA 09/06/2009  . IBS 09/06/2009  . COLONIC POLYPS, HX OF 09/06/2009  . WEIGHT LOSS 06/07/2009  . NAUSEA AND VOMITING 06/07/2009  . Abdominal pain 06/07/2009  . EPIGASTRIC PAIN 06/07/2009  . COLONIC POLYPS 02/21/2009  . DYSPHAGIA 02/21/2009  . PANIC DISORDER 02/20/2009  . CATARACTS 02/20/2009  . DIVERTICULOSIS OF COLON 02/20/2009  . HELICOBACTER PYLORI GASTRITIS, HX OF 02/20/2009    Past Surgical History:  Procedure Laterality Date  . APPENDECTOMY  1964  . CHOLECYSTECTOMY OPEN  1966  . COLONOSCOPY  2012   RMR: 1. External hemorrhoids, otherwise normal rectum. 2. Left-sided diverticula status post snare polypectomy left colon polyp. Remainder of the colonic mucosa and terminal ileum mucoa appeared normal.   . COLONOSCOPY N/A 02/11/2017   Procedure: COLONOSCOPY;  Surgeon: Daneil Dolin, MD; diverticulosis in the sigmoid and descending colon, 2 tubular adenomas, otherwise normal.  Repeat in 5 years.  Marland Kitchen DILATION AND CURETTAGE OF UTERUS    . TONSILLECTOMY  1961     OB History   No obstetric history on file.     Family History  Problem Relation Age of Onset  . Diabetes Mother   . Hypertension Mother   . Heart attack Mother   . Cancer Father        Melanoma   . Diabetes Brother   . Diabetes Son   . Colon cancer Neg Hx     Social History   Tobacco Use  . Smoking status: Never  Smoker  . Smokeless tobacco: Never Used  Substance Use Topics  . Alcohol use: No    Alcohol/week: 0.0 standard drinks  . Drug use: No    Home Medications Prior to Admission medications   Medication Sig Start Date End Date Taking? Authorizing Provider  acetaminophen (TYLENOL) 500 MG tablet Take 500 mg by mouth every 6 (six) hours as needed for mild pain or moderate pain.    [provider]  alprazolam Duanne Moron) 2 MG tablet Take 1-2 mg by mouth every 8 (eight) hours. Take 1/2 tablet 8 am and 8 pm then take 1 tablet at 2 pm  Taking 4 mg daily.     [provider]  docusate sodium (STOOL SOFTENER) 100 MG capsule Take 1 capsule (100 mg total) by mouth 2 (two) times daily. Patient taking differently: Take 100 mg by mouth daily.  06/07/18   Shelly Coss, MD  levothyroxine (SYNTHROID) 25 MCG tablet Take 25 mcg by mouth daily. 12/11/19   [provider]  metoprolol succinate (TOPROL-XL) 25 MG 24 hr tablet TAKE 1/2 TABLET DAILY. 05/30/20   Lelon Perla, MD  polyethylene glycol-electrolytes (TRILYTE) 420 g solution Take 4,000 mLs by mouth as directed. 01/31/20   Rourk, Cristopher Estimable, MD  rosuvastatin (CRESTOR) 10 MG tablet Take 20 mg by mouth at bedtime.  11/10/19   [provider]    Allergies    Ciprofloxacin, Codeine, Metronidazole, Morphine and related, and Other  Review of Systems   Review of Systems  Constitutional: Negative for chills and fever.  Respiratory: Negative for shortness of breath.   Cardiovascular: Negative for chest pain.  Gastrointestinal: Positive for abdominal pain and diarrhea. Negative for nausea and vomiting.  Genitourinary: Positive for frequency and urgency. Negative for dysuria and hematuria.    Physical Exam Updated Vital Signs BP (!) 122/59   Pulse 70   Temp 98 F (36.7 C) (Oral)   Resp 18   SpO2 100%   Physical Exam Vitals and nursing note reviewed.  Constitutional:      General: She is not in acute distress.    Appearance: She is well-developed.  HENT:     Head: Normocephalic and atraumatic.  Eyes:     General:        Right eye: No discharge.        Left eye: No discharge.     Conjunctiva/sclera: Conjunctivae normal.  Neck:     Vascular: No JVD.     Trachea: No tracheal deviation.  Cardiovascular:     Rate and Rhythm: Normal rate and regular rhythm.  Pulmonary:     Effort: Pulmonary effort is normal.     Breath sounds: Normal breath sounds.  Abdominal:     General: Abdomen is protuberant. A surgical scar is present. Bowel sounds are normal. There is no  distension.     Palpations: Abdomen is soft.     Tenderness: There is abdominal tenderness in the right upper quadrant, suprapubic area and left lower quadrant. There is no right CVA tenderness, left CVA tenderness, guarding or rebound.  Skin:    General: Skin is warm.     Findings: No erythema.  Neurological:     Mental Status: She is alert.  Psychiatric:        Behavior: Behavior normal.     ED Results / Procedures / Treatments   Labs (all labs ordered are listed, but only abnormal results are displayed) Labs Reviewed  COMPREHENSIVE METABOLIC PANEL - Abnormal; Notable for the  following components:      Result Value   Glucose, Bld 100 (*)    All other components within normal limits  CBC - Abnormal; Notable for the following components:   MCV 103.9 (*)    All other components within normal limits  URINALYSIS, ROUTINE W REFLEX MICROSCOPIC - Abnormal; Notable for the following components:   Hgb urine dipstick SMALL (*)    All other components within normal limits  LIPASE, BLOOD    EKG None  Radiology No results found.  Procedures Procedures (including critical care time)  Medications Ordered in ED Medications  sodium chloride flush (NS) 0.9 % injection 3 mL (has no administration in time range)  HYDROmorphone (DILAUDID) injection 0.25 mg (0.25 mg Intravenous Given 06/01/20 1441)  sodium chloride 0.9 % bolus 250 mL (250 mLs Intravenous New Bag/Given 06/01/20 1442)  iohexol (OMNIPAQUE) 300 MG/ML solution 100 mL (100 mLs Intravenous Contrast Given 06/01/20 1510)    ED Course  I have reviewed the triage vital signs and the nursing notes.  Pertinent labs & imaging results that were available during my care of the patient were reviewed by me and considered in my medical decision making (see chart for details).    MDM Rules/Calculators/A&P                          Patient presenting for evaluation of lower abdominal pain with associated urinary symptoms and diarrhea. She  is afebrile, vital signs are stable. Abdomen is soft with no rebound or guarding. She has a history of complicated diverticulitis and states this feels somewhat similar. Initially there was some concern for urinary retention as she had noted to the triage nurse that she was having difficulty urinating but she was able to provide a urine sample prior to my assessment and I obtained bedside ultrasound which showed that the bladder was decompressed.  Lab work reviewed and interpreted by myself shows no leukocytosis, no anemia, no metabolic derangements or renal insufficiency. UA is not suggestive of UTI or nephrolithiasis.   Patient requested to take her home Xanax while in the ED which was witnessed by myself and RN Madaline Guthrie. She notes that she has had side effects to narcotics in the past and would like some pain medicines for management of her symptoms. She request that the medication be given slowly at a very low dose which we will happily oblige.  3:33 PM Care signed out to oncoming provider PA Alroy Bailiff to follow up on CT scan. Disposition pending imaging.    Final Clinical Impression(s) / ED Diagnoses Final diagnoses:  Lower abdominal pain    Rx / DC Orders ED Discharge Orders    None       Debroah Baller 06/01/20 1533    Blanchie Dessert, MD 06/02/20 1630

## 2020-06-01 NOTE — ED Notes (Signed)
Attempted to bladder scan pt in triage, however bladder scanner not working per EMT. Sort tech notified to make pt a priority to get to back d/t bladder distention and urinary retention with unknown bladder volume.

## 2020-06-01 NOTE — ED Triage Notes (Signed)
Pt arrives to ED w/ c/o 8/10 R/LLQ abdominal pain and distention that started yesterday. Pt states she has been only able to void small amounts. Pt denies N/V/D.

## 2020-06-01 NOTE — ED Provider Notes (Signed)
Care assumed from [facility], [place], at shift change, please see their notes for full documentation of patient's complaint/HPI. Briefly, pt here with lower abdominal pain x 1 day. Results so far show reassuring lab work. Awaiting CT A/P. Plan is to dispo accordingly.   Physical Exam  BP (!) 122/59   Pulse 70   Temp 98 F (36.7 C) (Oral)   Resp 18   SpO2 100%   Physical Exam Vitals and nursing note reviewed.  Constitutional:      Appearance: She is not ill-appearing.  HENT:     Head: Normocephalic and atraumatic.  Eyes:     Conjunctiva/sclera: Conjunctivae normal.  Cardiovascular:     Rate and Rhythm: Normal rate and regular rhythm.  Pulmonary:     Effort: Pulmonary effort is normal.     Breath sounds: Normal breath sounds.  Abdominal:     Tenderness: There is abdominal tenderness in the right lower quadrant, suprapubic area and left lower quadrant. There is no guarding or rebound.  Skin:    General: Skin is warm and dry.     Coloration: Skin is not jaundiced.  Neurological:     Mental Status: She is alert.     ED Course/Procedures     Procedures  Results for orders placed or performed during the hospital encounter of 06/01/20  Lipase, blood  Result Value Ref Range   Lipase 38 11 - 51 U/L  Comprehensive metabolic panel  Result Value Ref Range   Sodium 141 135 - 145 mmol/L   Potassium 4.1 3.5 - 5.1 mmol/L   Chloride 107 98 - 111 mmol/L   CO2 24 22 - 32 mmol/L   Glucose, Bld 100 (H) 70 - 99 mg/dL   BUN 10 8 - 23 mg/dL   Creatinine, Ser 0.84 0.44 - 1.00 mg/dL   Calcium 9.5 8.9 - 10.3 mg/dL   Total Protein 7.5 6.5 - 8.1 g/dL   Albumin 4.2 3.5 - 5.0 g/dL   AST 40 15 - 41 U/L   ALT 20 0 - 44 U/L   Alkaline Phosphatase 79 38 - 126 U/L   Total Bilirubin 1.2 0.3 - 1.2 mg/dL   GFR calc non Af Amer >60 >60 mL/min   GFR calc Af Amer >60 >60 mL/min   Anion gap 10 5 - 15  CBC  Result Value Ref Range   WBC 10.0 4.0 - 10.5 K/uL   RBC 4.15 3.87 - 5.11 MIL/uL   Hemoglobin  13.4 12.0 - 15.0 g/dL   HCT 43.1 36 - 46 %   MCV 103.9 (H) 80.0 - 100.0 fL   MCH 32.3 26.0 - 34.0 pg   MCHC 31.1 30.0 - 36.0 g/dL   RDW 13.2 11.5 - 15.5 %   Platelets 286 150 - 400 K/uL   nRBC 0.0 0.0 - 0.2 %  Urinalysis, Routine w reflex microscopic  Result Value Ref Range   Color, Urine YELLOW YELLOW   APPearance CLEAR CLEAR   Specific Gravity, Urine 1.011 1.005 - 1.030   pH 5.0 5.0 - 8.0   Glucose, UA NEGATIVE NEGATIVE mg/dL   Hgb urine dipstick SMALL (A) NEGATIVE   Bilirubin Urine NEGATIVE NEGATIVE   Ketones, ur NEGATIVE NEGATIVE mg/dL   Protein, ur NEGATIVE NEGATIVE mg/dL   Nitrite NEGATIVE NEGATIVE   Leukocytes,Ua NEGATIVE NEGATIVE   RBC / HPF 0-5 0 - 5 RBC/hpf   WBC, UA 0-5 0 - 5 WBC/hpf   Bacteria, UA NONE SEEN NONE SEEN  Mucus PRESENT    CT ABDOMEN PELVIS W CONTRAST  Result Date: 06/01/2020 CLINICAL DATA:  Suspected diverticulitis EXAM: CT ABDOMEN AND PELVIS WITH CONTRAST TECHNIQUE: Multidetector CT imaging of the abdomen and pelvis was performed using the standard protocol following bolus administration of intravenous contrast. CONTRAST:  123mL OMNIPAQUE IOHEXOL 300 MG/ML  SOLN COMPARISON:  CT 01/11/2020 FINDINGS: Lower chest: Atelectatic changes in the lung bases. Cardiac size is upper limits normal. Hepatobiliary: Diffuse hepatic hypoattenuation compatible with hepatic steatosis. No focal liver abnormality is seen. Patient is post cholecystectomy. Slight prominence of the biliary tree likely related to reservoir effect. No calcified intraductal gallstones. Pancreas: Unremarkable. No pancreatic ductal dilatation or surrounding inflammatory changes. Spleen: Normal in size. No concerning splenic lesions. Adrenals/Urinary Tract: Normal adrenal glands. Kidneys are normally located with symmetric enhancement and excretion. No suspicious renal lesion, urolithiasis or hydronephrosis. Urinary bladder is unremarkable. Stomach/Bowel: Small sliding-type hiatal hernia similar to prior.  Distal esophagus, stomach and duodenal sweep are unremarkable. No small bowel wall thickening or dilatation. No evidence of obstruction. The appendix is surgically absent. Scattered colonic diverticula. More segmental thickening of the mid to distal sigmoid within a region of several colonic diverticula. Some slightly more focal Peri diverticular inflammation seen (7/69). No extraluminal gas or organized collection. Vascular/Lymphatic: Atherosclerotic calcifications within the abdominal aorta and branch vessels. No aneurysm or ectasia. No enlarged abdominopelvic lymph nodes. Reproductive: Normal appearance of the uterus and adnexal structures. Other: Small volume of free fluid in the deep pelvis. No free air. No bowel containing hernias. Musculoskeletal: Multilevel degenerative changes are present in the imaged portions of the spine. Mild levocurvature of the lumbar spine apex L3. Additional degenerative changes in the hips and pelvis. No worrisome osseous lesions. IMPRESSION: 1. Acute uncomplicated diverticulitis of the mid to distal sigmoid colon. No convincing evidence of perforation or abscess formation. Small volume free fluid in the pelvis, likely reactive. 2. Hepatic steatosis. 3. Small sliding-type hiatal hernia. 4. Aortic Atherosclerosis (ICD10-I70.0). Electronically Signed   By: Lovena Le M.D.   On: 06/01/2020 15:31    MDM  CT scan with findings on uncomplicated diverticulitis. Attending physician Dr. Ronnald Nian went and updated patient on findings; pt does continue to appear uncomfortable and appears concerned regarding her ability to take care of herself at home. Admission was offered and patient given time to think about it; I went back into the room to discuss options with patient however she states she would like to go home at this time. Will discharge with Augmentin and pain medication PRN. Pt instructed to follow up with her PCP for recheck in 2 weeks time. She is instructed to return to the ED  IMMEDIATELY for any worsening symptoms. She is in agreement with plan and stable for discharge home.   This note was prepared using Dragon voice recognition software and may include unintentional dictation errors due to the inherent limitations of voice recognition software.       Eustaquio Maize, PA-C 06/01/20 [place], [place], DO 06/01/20 1648

## 2020-06-19 DIAGNOSIS — Z299 Encounter for prophylactic measures, unspecified: Secondary | ICD-10-CM | POA: Diagnosis not present

## 2020-06-19 DIAGNOSIS — K5792 Diverticulitis of intestine, part unspecified, without perforation or abscess without bleeding: Secondary | ICD-10-CM | POA: Diagnosis not present

## 2020-06-19 DIAGNOSIS — E039 Hypothyroidism, unspecified: Secondary | ICD-10-CM | POA: Diagnosis not present

## 2020-06-19 DIAGNOSIS — K589 Irritable bowel syndrome without diarrhea: Secondary | ICD-10-CM | POA: Diagnosis not present

## 2020-06-19 DIAGNOSIS — I10 Essential (primary) hypertension: Secondary | ICD-10-CM | POA: Diagnosis not present

## 2020-07-04 ENCOUNTER — Ambulatory Visit: Payer: Medicare Other | Admitting: Cardiology

## 2020-07-04 IMAGING — CT CT ABD-PELV W/O CM
2 of 4 series · 17 of 46 positions shown, 19 images · non-contrast
Comparison: 06/06/2018

CLINICAL DATA: Left lower quadrant abdominal pain. Diagnosed with
diverticulitis on [REDACTED] with no improvement.

EXAM:
CT ABDOMEN AND PELVIS WITHOUT CONTRAST
TECHNIQUE: Multidetector CT imaging of the abdomen and pelvis was performed
following the standard protocol without IV contrast.

[Series 3: ap without · axial · non-contrast · 0.80mm/px · z∈[+530,+955]mm · 14 of 95 slices shown, 16 images]
[im 5/95  soft-tissue]
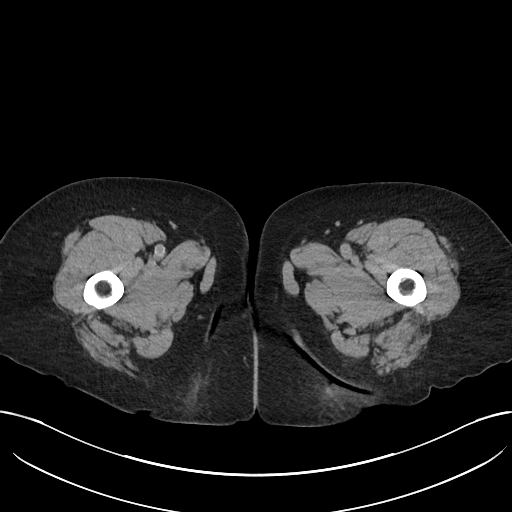
[im 5/95  bone]
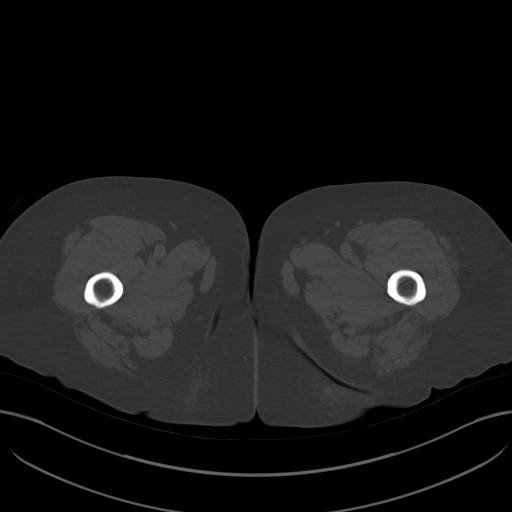
[im 15/95  soft-tissue]
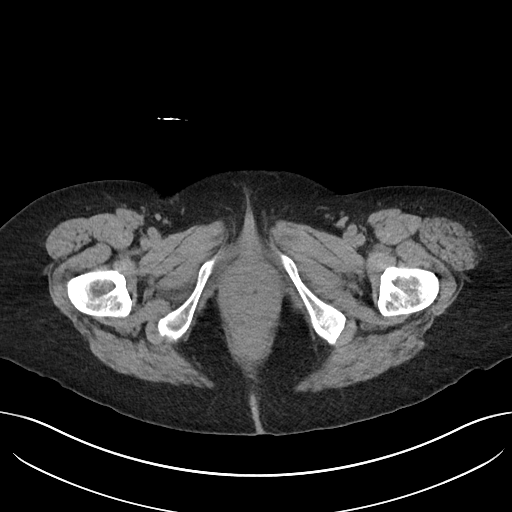
[im 19/95  soft-tissue]
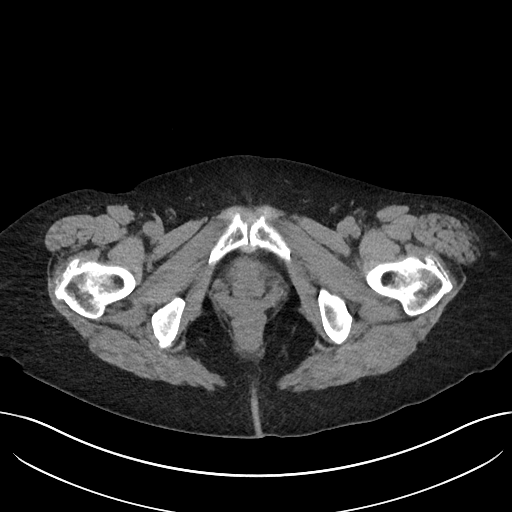
[im 24/95  soft-tissue]
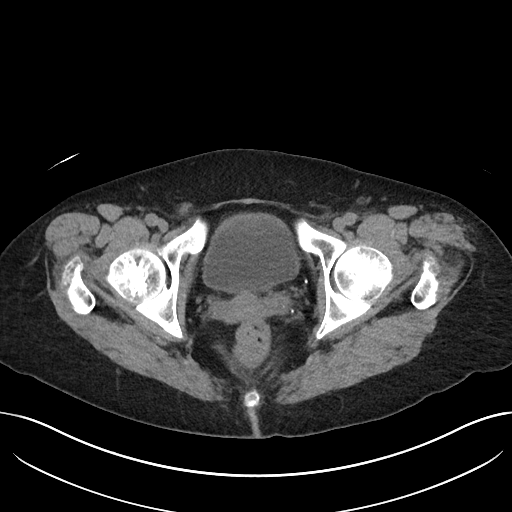
[im 33/95  soft-tissue]
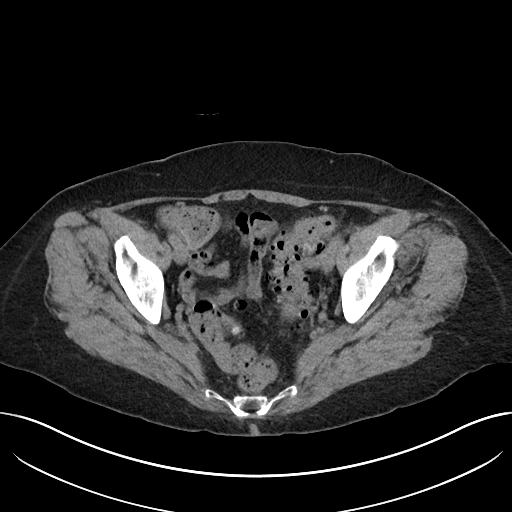
[im 38/95  soft-tissue]
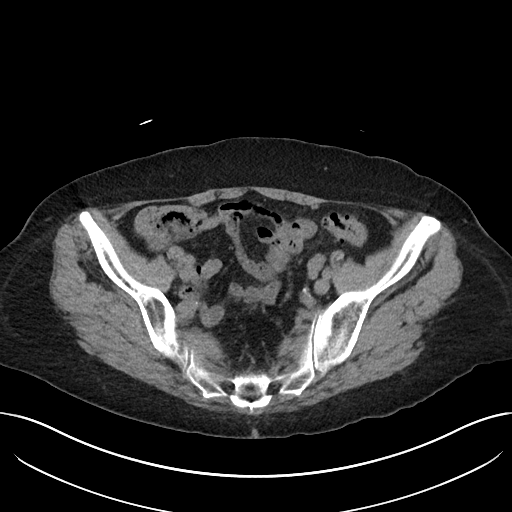
[im 43/95  soft-tissue]
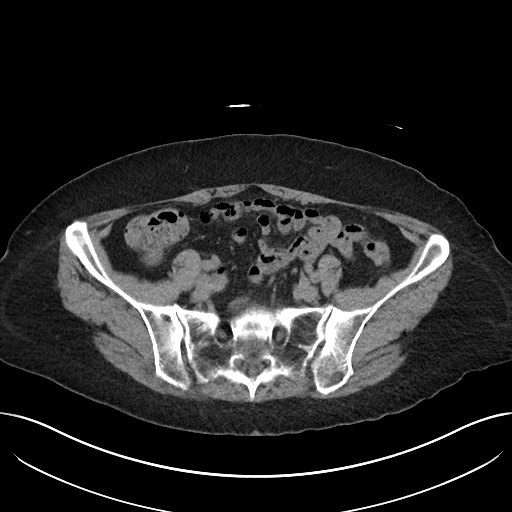
[im 52/95  soft-tissue]
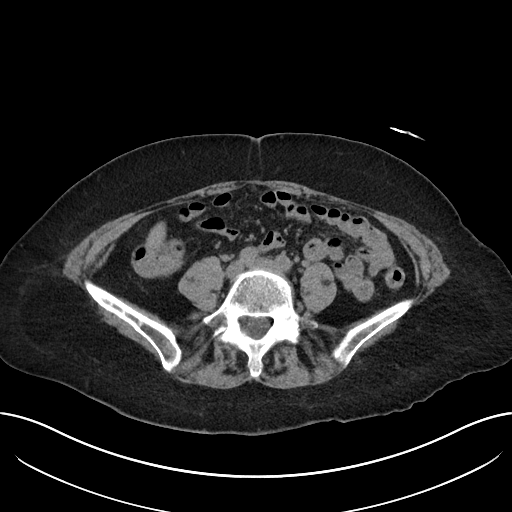
[im 57/95  soft-tissue]
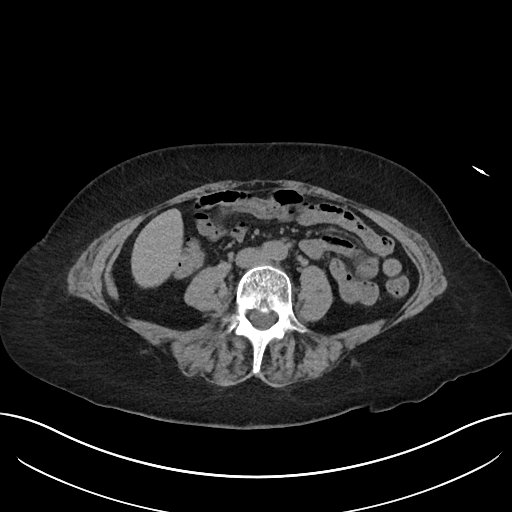
[im 57/95  bone]
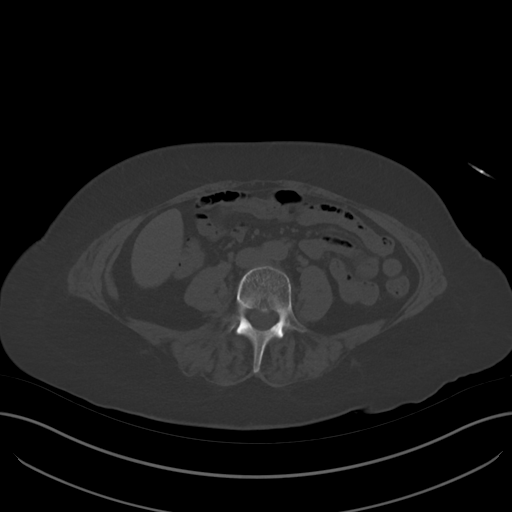
[im 62/95  soft-tissue]
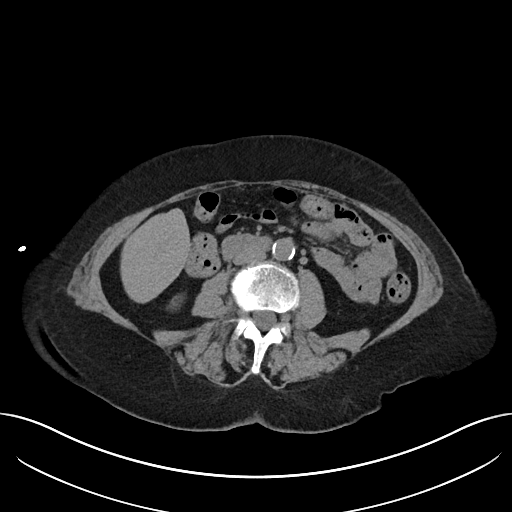
[im 71/95  soft-tissue]
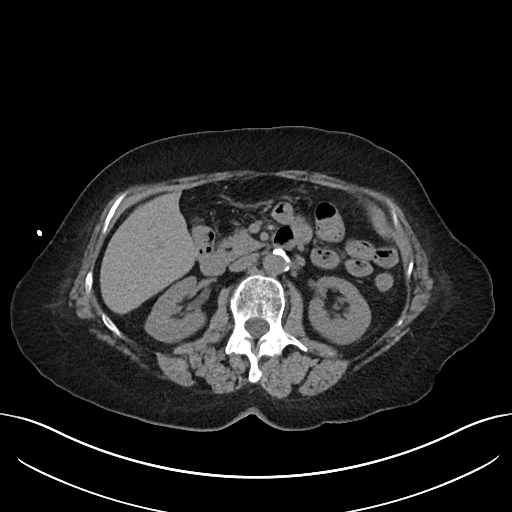
[im 76/95  soft-tissue]
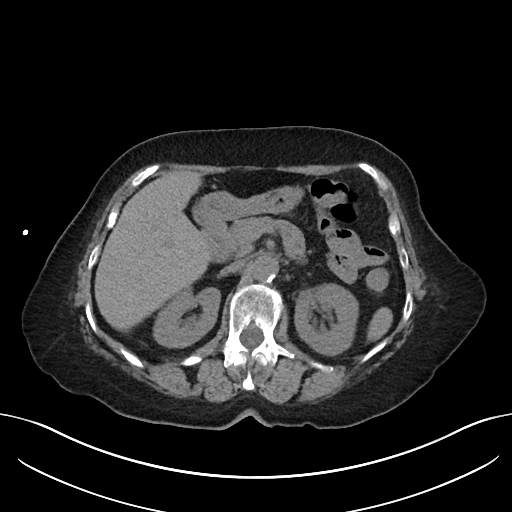
[im 80/95  soft-tissue]
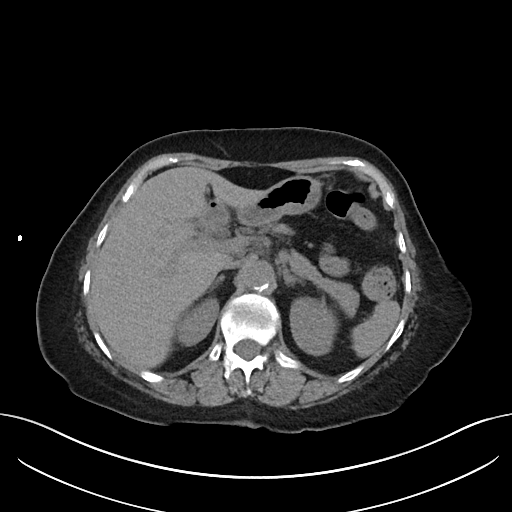
[im 90/95  soft-tissue]
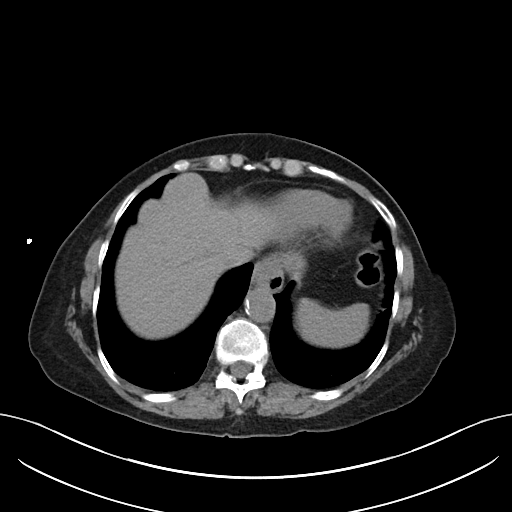

[Series 6: cor · coronal · 0.84mm/px · 3 of 91 slices shown]
[im 31/91  soft-tissue]
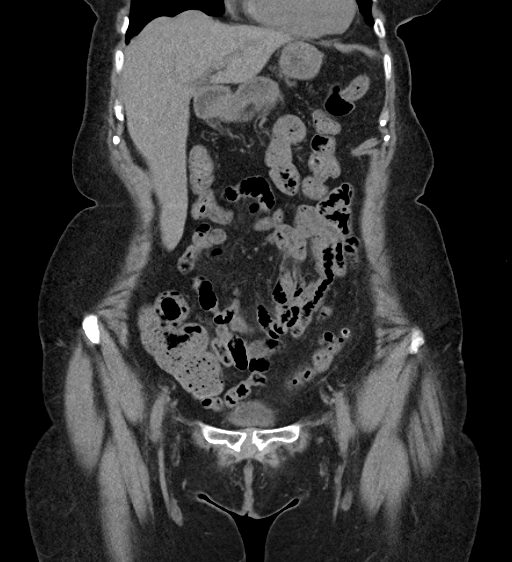
[im 41/91  soft-tissue]
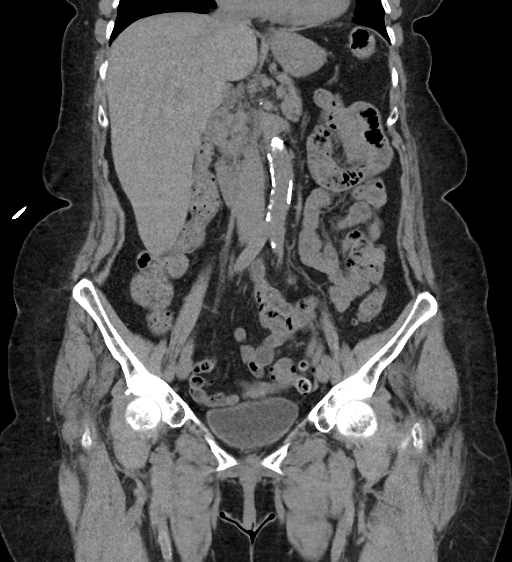
[im 51/91  soft-tissue]
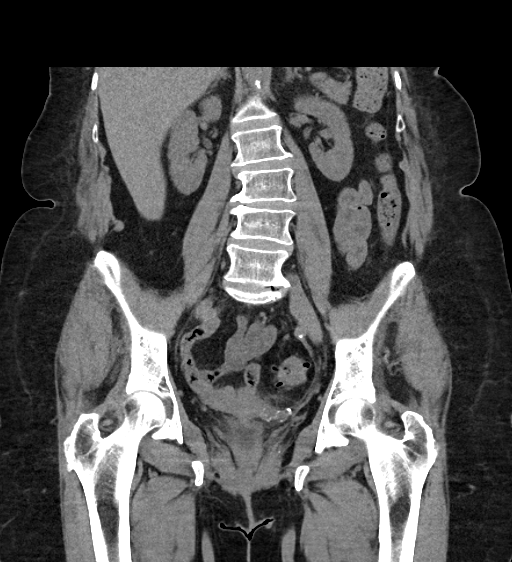

[17 of 46 positions shown; findings below may reference images not displayed]

FINDINGS: Lower chest: Linear scarring in the lung bases. Small esophageal
hiatal hernia.

Hepatobiliary: No focal liver abnormality is seen. No gallstones,
gallbladder wall thickening, or biliary dilatation.

Pancreas: Unremarkable. No pancreatic ductal dilatation or
surrounding inflammatory changes.

Spleen: Normal in size without focal abnormality.

Adrenals/Urinary Tract: Adrenal glands are unremarkable. Kidneys are
normal, without renal calculi, focal lesion, or hydronephrosis.
Bladder is unremarkable.

Stomach/Bowel: Stomach, small bowel, and colon are not abnormally
distended. Sigmoid colon diverticula with infiltration in the
pericolonic fat suggesting early diverticulitis. Mild improvement
since previous study. No developing abscess. Appendix is surgically
absent.

Vascular/Lymphatic: Aortic atherosclerosis. No enlarged abdominal or
pelvic lymph nodes.

Reproductive: Uterus and bilateral adnexa are unremarkable.

Other: No abdominal wall hernia or abnormality. No abdominopelvic
ascites.

Musculoskeletal: No acute or significant osseous findings.
IMPRESSION: Diverticulitis involving the sigmoid colon with improvement since
previous study. No developing abscess.

## 2020-07-12 ENCOUNTER — Telehealth: Payer: Self-pay | Admitting: Dermatology

## 2020-07-12 NOTE — Telephone Encounter (Signed)
Patient is calling to schedule referral appointment from Dr. Stanford Breed.  Appointment is scheduled for 12/18/2020 at 2:30 with Lavonna Monarch, MD.

## 2020-07-19 DIAGNOSIS — Z299 Encounter for prophylactic measures, unspecified: Secondary | ICD-10-CM | POA: Diagnosis not present

## 2020-07-19 DIAGNOSIS — R21 Rash and other nonspecific skin eruption: Secondary | ICD-10-CM | POA: Diagnosis not present

## 2020-09-03 NOTE — Progress Notes (Signed)
HPI: FU palpitations and chest pain. Echocardiogram in September of 2014 showed normal LV function. There was grade 1 diastolic dysfunction. Trace mitral regurgitation. Patient has had intermittent palpitations for years. Exercise treadmill in November of 2014 showed poor exercise tolerance. There was no chest pain, no ECG changes and no arrhythmias. Monitor in November of 2014 showed sinus rhythm with PACs and PVCs. Since I last saw her,  she denies dyspnea, chest pain or syncope.  Occasional palpitations but recently well controlled.  Current Outpatient Medications  Medication Sig Dispense Refill  . acetaminophen (TYLENOL) 500 MG tablet Take 500 mg by mouth every 6 (six) hours as needed for mild pain or moderate pain.    Marland Kitchen alprazolam (XANAX) 2 MG tablet Take 1-2 mg by mouth every 8 (eight) hours. Take 1/2 tablet 8 am and 8 pm then take 1 tablet at 2 pm  Taking 4 mg daily.    Marland Kitchen docusate sodium (STOOL SOFTENER) 100 MG capsule Take 1 capsule (100 mg total) by mouth 2 (two) times daily. (Patient taking differently: Take 100 mg by mouth daily. ) 60 capsule 0  . levothyroxine (SYNTHROID) 25 MCG tablet Take 25 mcg by mouth daily.    . metoprolol succinate (TOPROL-XL) 25 MG 24 hr tablet TAKE 1/2 TABLET DAILY. 30 tablet 0  . polyethylene glycol-electrolytes (TRILYTE) 420 g solution Take 4,000 mLs by mouth as directed. 4000 mL 0  . rosuvastatin (CRESTOR) 10 MG tablet Take 20 mg by mouth at bedtime.      No current facility-administered medications for this visit.     Past Medical History:  Diagnosis Date  . Agoraphobia with panic disorder 1982  . Brown recluse spider bite   . Constipation   . Diverticulitis   . GERD (gastroesophageal reflux disease)   . Hyperlipidemia   . Lyme disease   . Migraines    "don't have them since menopause" (09/23/2016)  . Panic disorder   . Shingles     Past Surgical History:  Procedure Laterality Date  . APPENDECTOMY  1964  . CHOLECYSTECTOMY OPEN   1966  . COLONOSCOPY  2012   RMR: 1. External hemorrhoids, otherwise normal rectum. 2. Left-sided diverticula status post snare polypectomy left colon polyp. Remainder of the colonic mucosa and terminal ileum mucoa appeared normal.   . COLONOSCOPY N/A 02/11/2017   Procedure: COLONOSCOPY;  Surgeon: Daneil Dolin, MD; diverticulosis in the sigmoid and descending colon, 2 tubular adenomas, otherwise normal.  Repeat in 5 years.  Marland Kitchen DILATION AND CURETTAGE OF UTERUS    . TONSILLECTOMY  1961    Social History   Socioeconomic History  . Marital status: Widowed    Spouse name: Not on file  . Number of children: 5  . Years of education: Not on file  . Highest education level: Not on file  Occupational History  . Not on file  Tobacco Use  . Smoking status: Never Smoker  . Smokeless tobacco: Never Used  Substance and Sexual Activity  . Alcohol use: No    Alcohol/week: 0.0 standard drinks  . Drug use: No  . Sexual activity: Never  Other Topics Concern  . Not on file  Social History Narrative  . Not on file   Social Determinants of Health   Financial Resource Strain:   . Difficulty of Paying Living Expenses: Not on file  Food Insecurity:   . Worried About Charity fundraiser in the Last Year: Not on file  . Ran  Out of Food in the Last Year: Not on file  Transportation Needs:   . Lack of Transportation (Medical): Not on file  . Lack of Transportation (Non-Medical): Not on file  Physical Activity:   . Days of Exercise per Week: Not on file  . Minutes of Exercise per Session: Not on file  Stress:   . Feeling of Stress : Not on file  Social Connections:   . Frequency of Communication with Friends and Family: Not on file  . Frequency of Social Gatherings with Friends and Family: Not on file  . Attends Religious Services: Not on file  . Active Member of Clubs or Organizations: Not on file  . Attends Archivist Meetings: Not on file  . Marital Status: Not on file  Intimate  Partner Violence:   . Fear of Current or Ex-Partner: Not on file  . Emotionally Abused: Not on file  . Physically Abused: Not on file  . Sexually Abused: Not on file    Family History  Problem Relation Age of Onset  . Diabetes Mother   . Hypertension Mother   . Heart attack Mother   . Cancer Father        Melanoma   . Diabetes Brother   . Diabetes Son   . Colon cancer Neg Hx     ROS: no fevers or chills, productive cough, hemoptysis, dysphasia, odynophagia, melena, hematochezia, dysuria, hematuria, rash, seizure activity, orthopnea, PND, pedal edema, claudication. Remaining systems are negative.  Physical Exam: Well-developed well-nourished in no acute distress.  Skin is warm and dry.  HEENT is normal.  Neck is supple.  Chest is clear to auscultation with normal expansion.  Cardiovascular exam is regular rate and rhythm.  Abdominal exam nontender or distended. No masses palpated. Extremities show no edema. neuro grossly intact  ECG-normal sinus rhythm at a rate of 68, left axis deviation, RV conduction delay.  Personally reviewed  A/P  1 palpitations-symptoms are controlled at present.  Continue Toprol at present dose.  2 history of chest pain-previous treadmill negative with no recurrent symptoms.  No plans for further ischemia evaluation.  Will consider cardiac CTA in the future with recurrences.  3 hyperlipidemia-followed by primary care.  Kirk Ruths, MD

## 2020-09-04 DIAGNOSIS — L821 Other seborrheic keratosis: Secondary | ICD-10-CM | POA: Diagnosis not present

## 2020-09-04 DIAGNOSIS — L218 Other seborrheic dermatitis: Secondary | ICD-10-CM | POA: Diagnosis not present

## 2020-09-04 DIAGNOSIS — L728 Other follicular cysts of the skin and subcutaneous tissue: Secondary | ICD-10-CM | POA: Diagnosis not present

## 2020-09-11 ENCOUNTER — Encounter: Payer: Self-pay | Admitting: Cardiology

## 2020-09-11 ENCOUNTER — Ambulatory Visit (INDEPENDENT_AMBULATORY_CARE_PROVIDER_SITE_OTHER): Payer: Medicare Other | Admitting: Cardiology

## 2020-09-11 ENCOUNTER — Other Ambulatory Visit: Payer: Self-pay

## 2020-09-11 VITALS — BP 124/62 | HR 68 | Ht 66.0 in | Wt 193.2 lb

## 2020-09-11 DIAGNOSIS — E7800 Pure hypercholesterolemia, unspecified: Secondary | ICD-10-CM

## 2020-09-11 DIAGNOSIS — R0789 Other chest pain: Secondary | ICD-10-CM | POA: Diagnosis not present

## 2020-09-11 DIAGNOSIS — R002 Palpitations: Secondary | ICD-10-CM

## 2020-09-11 NOTE — Patient Instructions (Signed)
°  Follow-Up: At [facility], you and your health needs are our priority.  As part of our continuing mission to provide you with exceptional heart care, we have created designated Provider Care Teams.  These Care Teams include your primary Cardiologist (physician) and Advanced Practice Providers (APPs -  Physician Assistants and Nurse Practitioners) who all work together to provide you with the care you need, when you need it.  We recommend signing up for the patient portal called "MyChart".  Sign up information is provided on this After Visit Summary.  MyChart is used to connect with patients for Virtual Visits (Telemedicine).  Patients are able to view lab/test results, encounter notes, upcoming appointments, etc.  Non-urgent messages can be sent to your provider as well.   To learn more about what you can do with MyChart, go to [email or web address].    Your next appointment:   12 month(s)  The format for your next appointment:   Virtual Visit   Provider:   Kirk Ruths, MD

## 2020-09-12 ENCOUNTER — Telehealth: Payer: Self-pay | Admitting: Cardiology

## 2020-09-12 NOTE — Telephone Encounter (Signed)
FYI-- Patient is calling to follow up regarding appointment on 09/11/20. She states during her EKG, Seborrheic Keratosis was discussed and she would like to make the nurse who assisted her aware that this condition is not contagious.   If additional questions, please return call.

## 2020-09-30 DIAGNOSIS — F419 Anxiety disorder, unspecified: Secondary | ICD-10-CM | POA: Diagnosis not present

## 2020-09-30 DIAGNOSIS — Z0000 Encounter for general adult medical examination without abnormal findings: Secondary | ICD-10-CM | POA: Diagnosis not present

## 2020-09-30 DIAGNOSIS — R5383 Other fatigue: Secondary | ICD-10-CM | POA: Diagnosis not present

## 2020-09-30 DIAGNOSIS — E039 Hypothyroidism, unspecified: Secondary | ICD-10-CM | POA: Diagnosis not present

## 2020-09-30 DIAGNOSIS — Z299 Encounter for prophylactic measures, unspecified: Secondary | ICD-10-CM | POA: Diagnosis not present

## 2020-09-30 DIAGNOSIS — Z7189 Other specified counseling: Secondary | ICD-10-CM | POA: Diagnosis not present

## 2020-09-30 DIAGNOSIS — Z6830 Body mass index (BMI) 30.0-30.9, adult: Secondary | ICD-10-CM | POA: Diagnosis not present

## 2020-09-30 DIAGNOSIS — Z1331 Encounter for screening for depression: Secondary | ICD-10-CM | POA: Diagnosis not present

## 2020-09-30 DIAGNOSIS — Z1339 Encounter for screening examination for other mental health and behavioral disorders: Secondary | ICD-10-CM | POA: Diagnosis not present

## 2020-10-08 DIAGNOSIS — Z23 Encounter for immunization: Secondary | ICD-10-CM | POA: Diagnosis not present

## 2020-10-25 DIAGNOSIS — H109 Unspecified conjunctivitis: Secondary | ICD-10-CM | POA: Diagnosis not present

## 2020-10-25 DIAGNOSIS — H00019 Hordeolum externum unspecified eye, unspecified eyelid: Secondary | ICD-10-CM | POA: Diagnosis not present

## 2020-10-30 DIAGNOSIS — Z299 Encounter for prophylactic measures, unspecified: Secondary | ICD-10-CM | POA: Diagnosis not present

## 2020-10-30 DIAGNOSIS — I10 Essential (primary) hypertension: Secondary | ICD-10-CM | POA: Diagnosis not present

## 2020-10-30 DIAGNOSIS — H00019 Hordeolum externum unspecified eye, unspecified eyelid: Secondary | ICD-10-CM | POA: Diagnosis not present

## 2020-10-30 DIAGNOSIS — R21 Rash and other nonspecific skin eruption: Secondary | ICD-10-CM | POA: Diagnosis not present

## 2020-12-11 ENCOUNTER — Ambulatory Visit: Payer: Medicare Other | Admitting: Dermatology

## 2020-12-18 ENCOUNTER — Ambulatory Visit: Payer: Medicare Other | Admitting: Dermatology

## 2021-01-09 DIAGNOSIS — R300 Dysuria: Secondary | ICD-10-CM | POA: Diagnosis not present

## 2021-01-09 DIAGNOSIS — I10 Essential (primary) hypertension: Secondary | ICD-10-CM | POA: Diagnosis not present

## 2021-01-09 DIAGNOSIS — R109 Unspecified abdominal pain: Secondary | ICD-10-CM | POA: Diagnosis not present

## 2021-01-09 DIAGNOSIS — L309 Dermatitis, unspecified: Secondary | ICD-10-CM | POA: Diagnosis not present

## 2021-01-09 DIAGNOSIS — Z299 Encounter for prophylactic measures, unspecified: Secondary | ICD-10-CM | POA: Diagnosis not present

## 2021-02-14 ENCOUNTER — Telehealth: Payer: Self-pay | Admitting: Cardiology

## 2021-02-14 NOTE — Telephone Encounter (Signed)
Increase in heart rate, blood pressure, or palpitations could happen with missed doses. She's on a very low dose of metoprolol though, so these possible symptoms would be less likely than if she were taking a higher dose of metoprolol.

## 2021-02-14 NOTE — Telephone Encounter (Signed)
Pt c/o medication issue:  1. Name of Medication:   metoprolol succinate (TOPROL-XL) 25 MG 24 hr tablet    2. How are you currently taking this medication (dosage and times per day)? Half a tablet daily  3. Are you having a reaction (difficulty breathing--STAT)? no  4. What is your medication issue? Patient states he noticed on the label of the medication it states not to skip any doses and she would like to know what would happen if she did. She states she is anxious now after reading this warning.

## 2021-02-14 NOTE — Telephone Encounter (Signed)
Returned call to patient, advised patient of PharmD's recommendations. Patient aware and verbalized understanding.   Patient asking if she can stop her Metoprolol Succinate all together. Patient states she feels fine and would like to stop taking this medication if Dr. Stanford Breed is okay with it.   Advised patient I would forward message to Dr. Stanford Breed.

## 2021-02-14 NOTE — Telephone Encounter (Signed)
Returned call to patient, patient calling in stating that she just realized on her Toprol XL it says to make sure not to skip any doses. Patient states that she sometimes misses doses. Patient would like to know possible side effects of this.   Patient currently takes Toprol XL 12.5mg  Daily   Patient denies any symptoms at this time.   Advised patient that I would forward message to PharmD for them to review and advise.   Patient verbalized understanding.

## 2021-02-15 NOTE — Telephone Encounter (Signed)
Ok to stop toprol and follow Alexa Wilson

## 2021-02-18 NOTE — Telephone Encounter (Signed)
Spoke with pt, Aware of dr crenshaw's recommendations.  °

## 2021-02-26 DIAGNOSIS — E039 Hypothyroidism, unspecified: Secondary | ICD-10-CM | POA: Diagnosis not present

## 2021-02-26 DIAGNOSIS — Z299 Encounter for prophylactic measures, unspecified: Secondary | ICD-10-CM | POA: Diagnosis not present

## 2021-02-26 DIAGNOSIS — J069 Acute upper respiratory infection, unspecified: Secondary | ICD-10-CM | POA: Diagnosis not present

## 2021-03-06 ENCOUNTER — Encounter: Payer: Self-pay | Admitting: Dermatology

## 2021-03-06 ENCOUNTER — Ambulatory Visit (INDEPENDENT_AMBULATORY_CARE_PROVIDER_SITE_OTHER): Payer: Medicare Other | Admitting: Dermatology

## 2021-03-06 ENCOUNTER — Other Ambulatory Visit: Payer: Self-pay

## 2021-03-06 DIAGNOSIS — L821 Other seborrheic keratosis: Secondary | ICD-10-CM | POA: Diagnosis not present

## 2021-03-06 DIAGNOSIS — Z1283 Encounter for screening for malignant neoplasm of skin: Secondary | ICD-10-CM

## 2021-03-06 DIAGNOSIS — D1800 Hemangioma unspecified site: Secondary | ICD-10-CM

## 2021-03-06 DIAGNOSIS — B078 Other viral warts: Secondary | ICD-10-CM | POA: Diagnosis not present

## 2021-03-07 DIAGNOSIS — Z299 Encounter for prophylactic measures, unspecified: Secondary | ICD-10-CM | POA: Diagnosis not present

## 2021-03-07 DIAGNOSIS — I10 Essential (primary) hypertension: Secondary | ICD-10-CM | POA: Diagnosis not present

## 2021-03-07 DIAGNOSIS — M25562 Pain in left knee: Secondary | ICD-10-CM | POA: Diagnosis not present

## 2021-03-07 DIAGNOSIS — M25511 Pain in right shoulder: Secondary | ICD-10-CM | POA: Diagnosis not present

## 2021-03-21 ENCOUNTER — Encounter: Payer: Self-pay | Admitting: Dermatology

## 2021-03-21 NOTE — Progress Notes (Signed)
   New Patient   Subjective  Alexa Wilson is a 75 y.o. female who presents for the following: Annual Exam (Few spots on face - LN2 by Dr Tarri Glenn-  No personal history of melanoma or non mole skin cancer. ).  General skin examination Location:  Duration:  Quality:  Associated Signs/Symptoms: Modifying Factors:  Severity:  Timing: Context:    The following portions of the chart were reviewed this encounter and updated as appropriate:  Tobacco  Allergies  Meds  Problems  Med Hx  Surg Hx  Fam Hx      Objective  Well appearing patient in no apparent distress; mood and affect are within normal limits. Objective  Left Inguinal Area: General skin examination.  No atypical moles or nonmelanoma skin cancers.  Scattered scars show no regrowth.  Objective  Left Collarbone: 5 mm light brown textured flattopped papule  Objective  Left Abdomen (side) - Upper: Multiple 1 to 2 mm smooth red papules  Objective  Left Middle Plantar Surface: For millimeter hyperkeratosis compatible with small plantar wart, nontender    A full examination was performed including scalp, head, eyes, ears, nose, lips, neck, chest, axillae, abdomen, back, buttocks, bilateral upper extremities, bilateral lower extremities, hands, feet, fingers, toes, fingernails, and toenails. All findings within normal limits unless otherwise noted below.  Areas beneath undergarments not fully examined.   Assessment & Plan  Encounter for screening for malignant neoplasm of skin Left Inguinal Area  Annual skin examination.  Seborrheic keratosis Left Collarbone  Leave if stable  Hemangioma, unspecified site Left Abdomen (side) - Upper  No intervention necessary  Other viral warts Left Middle Plantar Surface  Over the counter wart remover with salicylic acid if the lesion becomes symptomatic.

## 2021-04-01 DIAGNOSIS — I700 Atherosclerosis of aorta: Secondary | ICD-10-CM | POA: Diagnosis not present

## 2021-04-01 DIAGNOSIS — I739 Peripheral vascular disease, unspecified: Secondary | ICD-10-CM | POA: Diagnosis not present

## 2021-04-01 DIAGNOSIS — F419 Anxiety disorder, unspecified: Secondary | ICD-10-CM | POA: Diagnosis not present

## 2021-04-01 DIAGNOSIS — Z299 Encounter for prophylactic measures, unspecified: Secondary | ICD-10-CM | POA: Diagnosis not present

## 2021-04-01 DIAGNOSIS — D692 Other nonthrombocytopenic purpura: Secondary | ICD-10-CM | POA: Diagnosis not present

## 2021-06-14 ENCOUNTER — Emergency Department (HOSPITAL_COMMUNITY): Payer: Medicare HMO

## 2021-06-14 ENCOUNTER — Emergency Department (HOSPITAL_COMMUNITY)
Admission: EM | Admit: 2021-06-14 | Discharge: 2021-06-14 | Disposition: A | Discharge status: Home / Self Care | Payer: Medicare HMO | Attending: Emergency Medicine | Admitting: Emergency Medicine

## 2021-06-14 ENCOUNTER — Other Ambulatory Visit: Payer: Self-pay

## 2021-06-14 ENCOUNTER — Encounter (HOSPITAL_COMMUNITY): Payer: Self-pay | Admitting: Emergency Medicine

## 2021-06-14 DIAGNOSIS — R1032 Left lower quadrant pain: Secondary | ICD-10-CM | POA: Diagnosis present

## 2021-06-14 DIAGNOSIS — K5792 Diverticulitis of intestine, part unspecified, without perforation or abscess without bleeding: Secondary | ICD-10-CM | POA: Diagnosis not present

## 2021-06-14 DIAGNOSIS — K219 Gastro-esophageal reflux disease without esophagitis: Secondary | ICD-10-CM | POA: Insufficient documentation

## 2021-06-14 LAB — URINALYSIS, ROUTINE W REFLEX MICROSCOPIC
Bilirubin Urine: NEGATIVE
Glucose, UA: NEGATIVE mg/dL
Ketones, ur: NEGATIVE mg/dL
Nitrite: NEGATIVE
Protein, ur: NEGATIVE mg/dL
Specific Gravity, Urine: 1.012 (ref 1.005–1.030)
pH: 5 (ref 5.0–8.0)

## 2021-06-14 LAB — CBC WITH DIFFERENTIAL/PLATELET
Abs Immature Granulocytes: 0.02 10*3/uL (ref 0.00–0.07)
Basophils Absolute: 0.1 10*3/uL (ref 0.0–0.1)
Basophils Relative: 1 %
Eosinophils Absolute: 0.1 10*3/uL (ref 0.0–0.5)
Eosinophils Relative: 1 %
HCT: 42.2 % (ref 36.0–46.0)
Hemoglobin: 13.4 g/dL (ref 12.0–15.0)
Immature Granulocytes: 0 %
Lymphocytes Relative: 25 %
Lymphs Abs: 2 10*3/uL (ref 0.7–4.0)
MCH: 32.5 pg (ref 26.0–34.0)
MCHC: 31.8 g/dL (ref 30.0–36.0)
MCV: 102.4 fL — ABNORMAL HIGH (ref 80.0–100.0)
Monocytes Absolute: 0.6 10*3/uL (ref 0.1–1.0)
Monocytes Relative: 7 %
Neutro Abs: 5.4 10*3/uL (ref 1.7–7.7)
Neutrophils Relative %: 66 %
Platelets: 239 10*3/uL (ref 150–400)
RBC: 4.12 MIL/uL (ref 3.87–5.11)
RDW: 12.8 % (ref 11.5–15.5)
WBC: 8.1 10*3/uL (ref 4.0–10.5)
nRBC: 0 % (ref 0.0–0.2)

## 2021-06-14 LAB — COMPREHENSIVE METABOLIC PANEL
ALT: 19 U/L (ref 0–44)
AST: 32 U/L (ref 15–41)
Albumin: 4.1 g/dL (ref 3.5–5.0)
Alkaline Phosphatase: 98 U/L (ref 38–126)
Anion gap: 7 (ref 5–15)
BUN: 9 mg/dL (ref 8–23)
CO2: 25 mmol/L (ref 22–32)
Calcium: 8.9 mg/dL (ref 8.9–10.3)
Chloride: 107 mmol/L (ref 98–111)
Creatinine, Ser: 0.88 mg/dL (ref 0.44–1.00)
GFR, Estimated: >60 mL/min (ref >60)
Glucose, Bld: 102 mg/dL — ABNORMAL HIGH (ref 70–99)
Potassium: 3.2 mmol/L — ABNORMAL LOW (ref 3.5–5.1)
Sodium: 139 mmol/L (ref 135–145)
Total Bilirubin: 1 mg/dL (ref 0.3–1.2)
Total Protein: 7.8 g/dL (ref 6.5–8.1)

## 2021-06-14 LAB — LIPASE, BLOOD: Lipase: 33 U/L (ref 11–51)

## 2021-06-14 MED ORDER — AMOXICILLIN-POT CLAVULANATE 875-125 MG PO TABS: 1.0000 | ORAL_TABLET | Freq: Two times a day (BID) | ORAL | 0 refills | Status: AC

## 2021-06-14 MED ORDER — IOHEXOL 350 MG/ML SOLN
100.0000 mL | Freq: Once | INTRAVENOUS | Status: AC | PRN
  Administered 2021-06-14: 80 mL via INTRAVENOUS

## 2021-06-14 NOTE — ED Provider Notes (Signed)
Medical screening examination/treatment/procedure(s) were conducted as a shared visit with non-physician practitioner(s) and myself.  I personally evaluated the patient during the encounter.  Clinical Impression:   Final diagnoses:  Diverticulitis    This patient is a 75 year old female presenting with left lower quadrant abdominal pain since last night.  On exam this patient does have some reproducible tenderness in the left lower quadrant with a mild grimace but no guarding or peritoneal signs.  Heart and lung exams unremarkable, vital signs reassuring and there is no leukocytosis.  CT scan pending.     Noemi Chapel, MD 06/15/21 [phone]

## 2021-06-14 NOTE — ED Triage Notes (Signed)
Pt c/o abd pain and bloating since last night. Pt reports hx of diverticulitis

## 2021-06-14 NOTE — ED Provider Notes (Signed)
Emergency Medicine Provider Triage Evaluation Note  Alexa Wilson , a 75 y.o. female  was evaluated in triage.  Pt complains of abdominal pain. Hx of diverticulitis  Review of Systems  Positive: Abdominal pain, bloating, loose stools Negative: Fever, chills, nausea, vomiting, blood in stool  Physical Exam  BP (!) 145/66   Pulse 60   Temp 98.2 F (36.8 C) (Oral)   Resp 18   Ht '5\' 6"'$  (1.676 m)   Wt 86.2 kg   SpO2 99%   BMI 30.67 kg/m  Gen:   Awake, no distress   Resp:  Normal effort  MSK:   Moves extremities without difficulty  Other:  Bowel sounds present in all quadrants, tenderness to palpation in LLQ  Medical Decision Making  Medically screening exam initiated at 4:33 PM.  Appropriate orders placed.  Alexa Wilson was informed that the remainder of the evaluation will be completed by another provider, this initial triage assessment does not replace that evaluation, and the importance of remaining in the ED until their evaluation is complete.     Estill Cotta 06/14/21 1651    Noemi Chapel, MD 06/15/21 [phone]

## 2021-06-14 NOTE — ED Provider Notes (Signed)
[facility] EMERGENCY DEPARTMENT Provider Note   CSN: [id number] Arrival date & time: 06/14/21  1543     History Chief Complaint  Patient presents with   Abdominal Pain    Alexa Wilson is a 75 y.o. female.  [name] is a 75 y/o female with PMH of diverticulitis and panic disorder who presents to ED for abdominal pain. Patient is complaining of constant LLQ pain and bloating starting last night before going to bed. Pain does not radiate. Patient reports pain feels similarly to her last flare of diverticulitis. Patient has had several loose bowel movements since the onset of pain, no blood in her stool. Denies fever, chills, change in appetite, nausea, vomiting, dysuria, or pain with defecation. She has tried taking tylenol for the pain with minimal relief.   The history is provided by the patient.  Abdominal Pain Pain location:  LLQ Pain quality: bloating and cramping   Pain radiates to:  Does not radiate Pain severity:  Moderate Onset quality:  Gradual Duration:  1 day Timing:  Constant Progression:  Unchanged Chronicity:  New Relieved by:  Nothing Worsened by:  Movement and palpation Ineffective treatments:  Acetaminophen Associated symptoms: diarrhea   Associated symptoms: no anorexia, no chest pain, no chills, no constipation, no fever, no hematochezia, no melena, no nausea and no vomiting   Associated symptoms comment:  Several loose BM     Past Medical History:  Diagnosis Date   Agoraphobia with panic disorder 37   Brown recluse spider bite    Constipation    Diverticulitis    GERD (gastroesophageal reflux disease)    Hyperlipidemia    Lyme disease    Migraines    "don't have them since menopause" (09/23/2016)   Panic disorder    Shingles     Patient Active Problem List   Diagnosis Date Noted   History of colonic diverticulitis 02/02/2020   Diarrhea 02/02/2020   Diverticulitis of intestine with abscess 01/11/2020   Acute hypokalemia  09/22/2016   Hyperlipidemia 11/15/2015   Constipation 04/09/2015   Palpitations 08/11/2013   Chest pressure 07/15/2013   Right arm weakness 07/15/2013   Sigmoid diverticulitis 07/14/2012   DYSPEPSIA 09/06/2009   IBS 09/06/2009   COLONIC POLYPS, HX OF 09/06/2009   WEIGHT LOSS 06/07/2009   NAUSEA AND VOMITING 06/07/2009   Abdominal pain 06/07/2009   EPIGASTRIC PAIN 06/07/2009   COLONIC POLYPS 02/21/2009   DYSPHAGIA 02/21/2009   PANIC DISORDER 02/20/2009   CATARACTS 02/20/2009   DIVERTICULOSIS OF COLON [id number]   HELICOBACTER PYLORI GASTRITIS, HX OF 02/20/2009    Past Surgical History:  Procedure Laterality Date   [place]  2012   RMR: 1. External hemorrhoids, otherwise normal rectum. 2. Left-sided diverticula status post snare polypectomy left colon polyp. Remainder of the colonic mucosa and terminal ileum mucoa appeared normal.    COLONOSCOPY N/A 02/11/2017   Procedure: COLONOSCOPY;  Surgeon: Daneil Dolin, MD; diverticulosis in the sigmoid and descending colon, 2 tubular adenomas, otherwise normal.  Repeat in 5 years.   DILATION AND CURETTAGE OF UTERUS     TONSILLECTOMY  1961     OB History   No obstetric history on file.     Family History  Problem Relation Age of Onset   Diabetes Mother    Hypertension Mother    Heart attack Mother    Cancer Father        Melanoma  Diabetes Brother    Diabetes Son    Colon cancer Neg Hx     Social History   Tobacco Use   Smoking status: Never   Smokeless tobacco: Never  Substance Use Topics   Alcohol use: No    Alcohol/week: 0.0 standard drinks   Drug use: No    Home Medications Prior to Admission medications   Medication Sig Start Date End Date Taking? Authorizing Provider  acetaminophen (TYLENOL) 500 MG tablet Take 500 mg by mouth every 6 (six) hours as needed for mild pain or moderate pain.   Yes [provider]  alprazolam Duanne Moron) 2 MG tablet Take  1-2 mg by mouth every 8 (eight) hours. Take 1/2 tablet 8 am and 8 pm then take 1 tablet at 2 pm  Taking 4 mg daily.   Yes [provider]  amoxicillin-clavulanate (AUGMENTIN) 875-125 MG tablet Take 1 tablet by mouth 2 (two) times daily for 7 days. 06/14/21 06/21/21 Yes Serafina Topham T, PA-C  cholecalciferol (VITAMIN D3) 25 MCG (1000 UNIT) tablet Take 1,000 Units by mouth daily.   Yes [provider]  docusate sodium (STOOL SOFTENER) 100 MG capsule Take 1 capsule (100 mg total) by mouth 2 (two) times daily. Patient taking differently: Take 100 mg by mouth daily. 06/07/18  Yes Shelly Coss, MD  levothyroxine (SYNTHROID) 25 MCG tablet Take 25 mcg by mouth daily. 12/11/19  Yes [provider]  metoprolol succinate (TOPROL-XL) 25 MG 24 hr tablet Take 12.5 mg by mouth daily. 06/02/21  Yes [provider]  rosuvastatin (CRESTOR) 10 MG tablet Take 20 mg by mouth at bedtime.  11/10/19  Yes [provider]  polyethylene glycol-electrolytes (TRILYTE) 420 g solution Take 4,000 mLs by mouth as directed. Patient not taking: No sig reported 01/31/20   Daneil Dolin, MD    Allergies    Ciprofloxacin, Codeine, Metronidazole, Morphine and related, and Other  Review of Systems   Review of Systems  Constitutional:  Negative for chills and fever.  Cardiovascular:  Negative for chest pain.  Gastrointestinal:  Positive for abdominal pain and diarrhea. Negative for anorexia, constipation, hematochezia, melena, nausea and vomiting.  All other systems reviewed and are negative.  Physical Exam Updated Vital Signs BP (!) 161/83   Pulse 64   Temp 98.2 F (36.8 C) (Oral)   Resp 10   Ht '5\' 6"'$  (1.676 m)   Wt 86.2 kg   SpO2 100%   BMI 30.67 kg/m   Physical Exam Vitals and nursing note reviewed.  Constitutional:      Appearance: Normal appearance.  HENT:     Head: Normocephalic and atraumatic.  Eyes:     Conjunctiva/sclera: Conjunctivae normal.  Cardiovascular:      Rate and Rhythm: Normal rate and regular rhythm.  Pulmonary:     Effort: Pulmonary effort is normal. No respiratory distress.     Breath sounds: Normal breath sounds.  Abdominal:     General: Bowel sounds are normal. There is distension.     Palpations: Abdomen is soft.     Tenderness: There is abdominal tenderness in the left lower quadrant. There is no guarding or rebound.     Comments: Mild bloating.  Skin:    General: Skin is warm and dry.  Neurological:     General: No focal deficit present.     Mental Status: She is alert and oriented to person, place, and time.    ED Results / Procedures / Treatments   Labs (all  labs ordered are listed, but only abnormal results are displayed) Labs Reviewed  COMPREHENSIVE METABOLIC PANEL - Abnormal; Notable for the following components:      Result Value   Potassium 3.2 (*)    Glucose, Bld 102 (*)    All other components within normal limits  CBC WITH DIFFERENTIAL/PLATELET - Abnormal; Notable for the following components:   MCV 102.4 (*)    All other components within normal limits  URINALYSIS, ROUTINE W REFLEX MICROSCOPIC - Abnormal; Notable for the following components:   APPearance HAZY (*)    Hgb urine dipstick SMALL (*)    Leukocytes,Ua SMALL (*)    Bacteria, UA RARE (*)    All other components within normal limits  LIPASE, BLOOD    EKG None  Radiology CT ABDOMEN PELVIS W CONTRAST  Result Date: 06/14/2021 CLINICAL DATA:  Abdominal pain and bloating for 2 days, history of diverticulitis EXAM: CT ABDOMEN AND PELVIS WITH CONTRAST TECHNIQUE: Multidetector CT imaging of the abdomen and pelvis was performed using the standard protocol following bolus administration of intravenous contrast. CONTRAST:  29m OMNIPAQUE IOHEXOL 350 MG/ML SOLN COMPARISON:  06/01/2020 FINDINGS: Lower chest: Mild scarring is noted in the bases bilaterally. Hepatobiliary: No focal liver abnormality is seen. Status post cholecystectomy. No biliary dilatation.  Pancreas: Unremarkable. No pancreatic ductal dilatation or surrounding inflammatory changes. Spleen: Normal in size without focal abnormality. Adrenals/Urinary Tract: Adrenal glands are within normal limits. Kidneys demonstrate a normal enhancement pattern bilaterally. No renal calculi or urinary tract obstructive changes are seen. Normal excretion is noted on delayed images. The ureters are within normal limits. Bladder is partially distended. Stomach/Bowel: Colon shows diverticular change with very mild sigmoid diverticulitis. No perforation or abscess formation is noted. Remainder of the colon appears within normal limits. The appendix has been surgically removed. Small bowel and stomach appear within normal limits. Vascular/Lymphatic: Aortic atherosclerosis. No enlarged abdominal or pelvic lymph nodes. Reproductive: Uterus and bilateral adnexa are unremarkable. Other: No abdominal wall hernia or abnormality. No abdominopelvic ascites. Musculoskeletal: No acute or significant osseous findings. IMPRESSION: Changes consistent with mild diverticulitis. No abscess or perforation is noted. No other focal abnormality is noted. Electronically Signed   By: MInez CatalinaM.D.   On: 06/14/2021 19:55    Procedures Procedures   Medications Ordered in ED Medications  iohexol (OMNIPAQUE) 350 MG/ML injection 100 mL (80 mLs Intravenous Contrast Given 06/14/21 1938)    ED Course  I have reviewed the triage vital signs and the nursing notes.  Pertinent labs & imaging results that were available during my care of the patient were reviewed by me and considered in my medical decision making (see chart for details).    MDM Rules/Calculators/A&P                           Patient is 75y/o female who presents with LLQ pain x 1 day. Patient has history of diverticulitis with last flare 1 year ago during which she was hospitalized. Patient notes pain began last night before going to bed. Endorses some loose stools.  Denies blood in stool, fever, chills, change in appetite, nausea, vomiting, dysuria, or pain with defecation. Differential to include diverticulitis, fecal impaction, nephrolithiasis, cystitis, and colitis.   Physical exam showed moderate bloating tenderness to LLQ, no radiation, guarding or rebound tenderness. Preliminary labs to include UA, CBC, CMP, and lipase. Small leukocytes in urine but otherwise unremarkable for nephrolithiasis or urinary tract infection. CBC shows no  leukocytosis. Normal LFTs. Ordered CT abdomen pelvis with contrast, findings consistent with mild diverticulitis. Due to patient appearing otherwise well, no peritoneal signs or leukocytosis, I do not think patient requires admission and inpatient treatment for uncomplicated diverticulitis. Patient would benefit from outpatient treatment with course of antibiotics, and should return to ED for new or worsening pain.   Final Clinical Impression(s) / ED Diagnoses Final diagnoses:  Diverticulitis    Rx / DC Orders ED Discharge Orders          Ordered    amoxicillin-clavulanate (AUGMENTIN) 875-125 MG tablet  2 times daily        06/14/21 2022             Caleb Prigmore T, PA-C 06/14/21 2032    Noemi Chapel, MD 06/15/21 [phone]

## 2021-06-14 NOTE — Discharge Instructions (Addendum)
Your CT scan of your abdomen showed signs of mild diverticulitis. There was no evidence of other acute abnormalities. You are being prescribed a regimen of antibiotics. Make sure you take the entire course of antibiotics. Return to the ED for worsening symptoms, fever, or severe abdominal pain.

## 2021-06-20 DIAGNOSIS — Z299 Encounter for prophylactic measures, unspecified: Secondary | ICD-10-CM | POA: Diagnosis not present

## 2021-06-20 DIAGNOSIS — I10 Essential (primary) hypertension: Secondary | ICD-10-CM | POA: Diagnosis not present

## 2021-06-20 DIAGNOSIS — K5792 Diverticulitis of intestine, part unspecified, without perforation or abscess without bleeding: Secondary | ICD-10-CM | POA: Diagnosis not present

## 2021-07-09 DIAGNOSIS — K5900 Constipation, unspecified: Secondary | ICD-10-CM | POA: Diagnosis not present

## 2021-07-09 DIAGNOSIS — K649 Unspecified hemorrhoids: Secondary | ICD-10-CM | POA: Diagnosis not present

## 2021-07-09 DIAGNOSIS — Z299 Encounter for prophylactic measures, unspecified: Secondary | ICD-10-CM | POA: Diagnosis not present

## 2021-07-09 DIAGNOSIS — I10 Essential (primary) hypertension: Secondary | ICD-10-CM | POA: Diagnosis not present

## 2021-08-15 DIAGNOSIS — R519 Headache, unspecified: Secondary | ICD-10-CM | POA: Diagnosis not present

## 2021-08-15 DIAGNOSIS — J069 Acute upper respiratory infection, unspecified: Secondary | ICD-10-CM | POA: Diagnosis not present

## 2021-08-15 DIAGNOSIS — Z6830 Body mass index (BMI) 30.0-30.9, adult: Secondary | ICD-10-CM | POA: Diagnosis not present

## 2021-08-15 DIAGNOSIS — R0981 Nasal congestion: Secondary | ICD-10-CM | POA: Diagnosis not present

## 2021-08-15 DIAGNOSIS — Z299 Encounter for prophylactic measures, unspecified: Secondary | ICD-10-CM | POA: Diagnosis not present

## 2021-08-16 DIAGNOSIS — Z20828 Contact with and (suspected) exposure to other viral communicable diseases: Secondary | ICD-10-CM | POA: Diagnosis not present

## 2021-08-19 DIAGNOSIS — R0981 Nasal congestion: Secondary | ICD-10-CM | POA: Diagnosis not present

## 2021-08-19 DIAGNOSIS — J069 Acute upper respiratory infection, unspecified: Secondary | ICD-10-CM | POA: Diagnosis not present

## 2021-08-19 DIAGNOSIS — Z299 Encounter for prophylactic measures, unspecified: Secondary | ICD-10-CM | POA: Diagnosis not present

## 2021-09-05 ENCOUNTER — Telehealth: Payer: Self-pay | Admitting: Cardiology

## 2021-09-05 NOTE — Telephone Encounter (Signed)
Pt c/o medication issue:  1. Name of Medication: metoprolol succinate (TOPROL-XL) 25 MG 24 hr tablet  2. How are you currently taking this medication (dosage and times per day)? Take 12.5 mg by mouth daily.  3. Are you having a reaction (difficulty breathing--STAT)? no  4. What is your medication issue? Patient wants to know if she can start taking this medication again

## 2021-09-05 NOTE — Telephone Encounter (Signed)
Attempt to return call-no answer and unable to leave VM (VM full)

## 2021-09-08 DIAGNOSIS — E7800 Pure hypercholesterolemia, unspecified: Secondary | ICD-10-CM | POA: Diagnosis not present

## 2021-09-08 DIAGNOSIS — I10 Essential (primary) hypertension: Secondary | ICD-10-CM | POA: Diagnosis not present

## 2021-09-08 DIAGNOSIS — Z1331 Encounter for screening for depression: Secondary | ICD-10-CM | POA: Diagnosis not present

## 2021-09-08 DIAGNOSIS — E039 Hypothyroidism, unspecified: Secondary | ICD-10-CM | POA: Diagnosis not present

## 2021-09-08 DIAGNOSIS — Z299 Encounter for prophylactic measures, unspecified: Secondary | ICD-10-CM | POA: Diagnosis not present

## 2021-09-08 DIAGNOSIS — Z6830 Body mass index (BMI) 30.0-30.9, adult: Secondary | ICD-10-CM | POA: Diagnosis not present

## 2021-09-08 DIAGNOSIS — Z7189 Other specified counseling: Secondary | ICD-10-CM | POA: Diagnosis not present

## 2021-09-08 DIAGNOSIS — Z0000 Encounter for general adult medical examination without abnormal findings: Secondary | ICD-10-CM | POA: Diagnosis not present

## 2021-09-08 DIAGNOSIS — Z1339 Encounter for screening examination for other mental health and behavioral disorders: Secondary | ICD-10-CM | POA: Diagnosis not present

## 2021-09-08 DIAGNOSIS — R5383 Other fatigue: Secondary | ICD-10-CM | POA: Diagnosis not present

## 2021-09-10 NOTE — Telephone Encounter (Signed)
Patient returned call

## 2021-09-12 MED ORDER — METOPROLOL SUCCINATE ER 25 MG PO TB24: 12.5000 mg | ORAL_TABLET | Freq: Every day | ORAL | 3 refills | Status: AC

## 2021-09-12 NOTE — Telephone Encounter (Signed)
Spoke with pt, she would just feel better taking the metoprolol for her PVC's. Refill sent to the pharmacy.

## 2021-09-23 DIAGNOSIS — J069 Acute upper respiratory infection, unspecified: Secondary | ICD-10-CM | POA: Diagnosis not present

## 2021-09-23 DIAGNOSIS — Z299 Encounter for prophylactic measures, unspecified: Secondary | ICD-10-CM | POA: Diagnosis not present

## 2021-09-23 DIAGNOSIS — Z6830 Body mass index (BMI) 30.0-30.9, adult: Secondary | ICD-10-CM | POA: Diagnosis not present

## 2021-09-23 DIAGNOSIS — I10 Essential (primary) hypertension: Secondary | ICD-10-CM | POA: Diagnosis not present

## 2021-09-23 DIAGNOSIS — Z789 Other specified health status: Secondary | ICD-10-CM | POA: Diagnosis not present

## 2021-10-31 DIAGNOSIS — E7800 Pure hypercholesterolemia, unspecified: Secondary | ICD-10-CM | POA: Diagnosis not present

## 2021-10-31 DIAGNOSIS — I700 Atherosclerosis of aorta: Secondary | ICD-10-CM | POA: Diagnosis not present

## 2021-10-31 DIAGNOSIS — J069 Acute upper respiratory infection, unspecified: Secondary | ICD-10-CM | POA: Diagnosis not present

## 2021-10-31 DIAGNOSIS — Z299 Encounter for prophylactic measures, unspecified: Secondary | ICD-10-CM | POA: Diagnosis not present

## 2021-10-31 DIAGNOSIS — I10 Essential (primary) hypertension: Secondary | ICD-10-CM | POA: Diagnosis not present

## 2021-11-06 ENCOUNTER — Other Ambulatory Visit: Payer: Medicare Other | Admitting: Obstetrics & Gynecology

## 2021-11-19 NOTE — Progress Notes (Signed)
HPI: FU palpitations and chest pain. Echocardiogram in September of 2014 showed normal LV function. There was grade 1 diastolic dysfunction. Trace mitral regurgitation. Patient has had intermittent palpitations for years. Exercise treadmill in November of 2014 showed poor exercise tolerance. There was no chest pain, no ECG changes and no arrhythmias. Monitor in November of 2014 showed sinus rhythm with PACs and PVCs. Since I last saw her, she denies dyspnea, chest pain, palpitations or syncope.  Current Outpatient Medications  Medication Sig Dispense Refill   acetaminophen (TYLENOL) 500 MG tablet Take 500 mg by mouth every 6 (six) hours as needed for mild pain or moderate pain.     alprazolam (XANAX) 2 MG tablet Take 1-2 mg by mouth every 8 (eight) hours. Take 1/2 tablet 8 am and 8 pm then take 1 tablet at 2 pm  Taking 4 mg daily.     cholecalciferol (VITAMIN D3) 25 MCG (1000 UNIT) tablet Take 1,000 Units by mouth daily.     docusate sodium (STOOL SOFTENER) 100 MG capsule Take 1 capsule (100 mg total) by mouth 2 (two) times daily. (Patient taking differently: Take 100 mg by mouth daily.) 60 capsule 0   levothyroxine (SYNTHROID) 25 MCG tablet Take 25 mcg by mouth daily.     metoprolol succinate (TOPROL-XL) 25 MG 24 hr tablet Take 0.5 tablets (12.5 mg total) by mouth daily. 45 tablet 3   polyethylene glycol-electrolytes (TRILYTE) 420 g solution Take 4,000 mLs by mouth as directed. 4000 mL 0   rosuvastatin (CRESTOR) 10 MG tablet Take 20 mg by mouth at bedtime.      No current facility-administered medications for this visit.     Past Medical History:  Diagnosis Date   Agoraphobia with panic disorder 49   Brown recluse spider bite    Constipation    Diverticulitis    GERD (gastroesophageal reflux disease)    Hyperlipidemia    Lyme disease    Migraines    "don't have them since menopause" (09/23/2016)   Panic disorder    Shingles     Past Surgical History:  Procedure  Laterality Date   [place]   COLONOSCOPY  2012   RMR: 1. External hemorrhoids, otherwise normal rectum. 2. Left-sided diverticula status post snare polypectomy left colon polyp. Remainder of the colonic mucosa and terminal ileum mucoa appeared normal.    COLONOSCOPY N/A 02/11/2017   Procedure: COLONOSCOPY;  Surgeon: Daneil Dolin, MD; diverticulosis in the sigmoid and descending colon, 2 tubular adenomas, otherwise normal.  Repeat in 5 years.   DILATION AND CURETTAGE OF UTERUS     TONSILLECTOMY  1961    Social History   Socioeconomic History   Marital status: Widowed    Spouse name: Not on file   Number of children: 5   Years of education: Not on file   Highest education level: Not on file  Occupational History   Not on file  Tobacco Use   Smoking status: Never   Smokeless tobacco: Never  Substance and Sexual Activity   Alcohol use: No    Alcohol/week: 0.0 standard drinks   Drug use: No   Sexual activity: Never  Other Topics Concern   Not on file  Social History Narrative   Not on file   Social Determinants of Health   Financial Resource Strain: Not on file  Food Insecurity: Not on file  Transportation Needs: Not on file  Physical Activity: Not on  file  Stress: Not on file  Social Connections: Not on file  Intimate Partner Violence: Not on file    Family History  Problem Relation Age of Onset   Diabetes Mother    Hypertension Mother    Heart attack Mother    Cancer Father        Melanoma    Diabetes Brother    Diabetes Son    Colon cancer Neg Hx     ROS: no fevers or chills, productive cough, hemoptysis, dysphasia, odynophagia, melena, hematochezia, dysuria, hematuria, rash, seizure activity, orthopnea, PND, pedal edema, claudication. Remaining systems are negative.  Physical Exam: Well-developed well-nourished in no acute distress.  Skin is warm and dry.  HEENT is normal.  Neck is supple.  Chest is clear to  auscultation with normal expansion.  Cardiovascular exam is regular rate and rhythm.  Abdominal exam nontender or distended. No masses palpated. Extremities show no edema. neuro grossly intact  ECG-normal sinus rhythm at a rate of 62, RV conduction delay.  Personally reviewed  A/P  1 palpitations-patient symptoms are controlled.  Continue Toprol.  2 history of chest pain-patient has had no recurrent symptoms.  Previous exercise treadmill unrevealing.  Can consider cardiac CTA in the future if needed.  3 hyperlipidemia-continue statin.  Kirk Ruths, MD

## 2021-12-02 ENCOUNTER — Other Ambulatory Visit: Payer: Self-pay

## 2021-12-02 ENCOUNTER — Encounter: Payer: Self-pay | Admitting: Cardiology

## 2021-12-02 ENCOUNTER — Ambulatory Visit (INDEPENDENT_AMBULATORY_CARE_PROVIDER_SITE_OTHER): Payer: Medicare HMO | Admitting: Cardiology

## 2021-12-02 VITALS — BP 126/84 | HR 62 | Ht 66.0 in | Wt 191.6 lb

## 2021-12-02 DIAGNOSIS — E7800 Pure hypercholesterolemia, unspecified: Secondary | ICD-10-CM

## 2021-12-02 DIAGNOSIS — R0789 Other chest pain: Secondary | ICD-10-CM

## 2021-12-02 DIAGNOSIS — R002 Palpitations: Secondary | ICD-10-CM

## 2021-12-02 NOTE — Patient Instructions (Signed)

## 2021-12-08 DIAGNOSIS — E039 Hypothyroidism, unspecified: Secondary | ICD-10-CM | POA: Diagnosis not present

## 2021-12-08 DIAGNOSIS — G8929 Other chronic pain: Secondary | ICD-10-CM | POA: Diagnosis not present

## 2021-12-08 DIAGNOSIS — M545 Low back pain, unspecified: Secondary | ICD-10-CM | POA: Diagnosis not present

## 2021-12-08 DIAGNOSIS — M1990 Unspecified osteoarthritis, unspecified site: Secondary | ICD-10-CM | POA: Diagnosis not present

## 2021-12-16 DIAGNOSIS — I10 Essential (primary) hypertension: Secondary | ICD-10-CM | POA: Diagnosis not present

## 2021-12-16 DIAGNOSIS — E039 Hypothyroidism, unspecified: Secondary | ICD-10-CM | POA: Diagnosis not present

## 2021-12-16 DIAGNOSIS — R7989 Other specified abnormal findings of blood chemistry: Secondary | ICD-10-CM | POA: Diagnosis not present

## 2021-12-16 DIAGNOSIS — Z789 Other specified health status: Secondary | ICD-10-CM | POA: Diagnosis not present

## 2021-12-16 DIAGNOSIS — Z299 Encounter for prophylactic measures, unspecified: Secondary | ICD-10-CM | POA: Diagnosis not present

## 2021-12-16 DIAGNOSIS — I700 Atherosclerosis of aorta: Secondary | ICD-10-CM | POA: Diagnosis not present

## 2021-12-16 DIAGNOSIS — E7800 Pure hypercholesterolemia, unspecified: Secondary | ICD-10-CM | POA: Diagnosis not present

## 2022-01-15 DIAGNOSIS — I10 Essential (primary) hypertension: Secondary | ICD-10-CM | POA: Diagnosis not present

## 2022-01-15 DIAGNOSIS — M545 Low back pain, unspecified: Secondary | ICD-10-CM | POA: Diagnosis not present

## 2022-01-15 DIAGNOSIS — Z299 Encounter for prophylactic measures, unspecified: Secondary | ICD-10-CM | POA: Diagnosis not present

## 2022-01-15 DIAGNOSIS — B354 Tinea corporis: Secondary | ICD-10-CM | POA: Diagnosis not present

## 2022-01-15 DIAGNOSIS — I8391 Asymptomatic varicose veins of right lower extremity: Secondary | ICD-10-CM | POA: Diagnosis not present

## 2022-01-27 ENCOUNTER — Encounter: Payer: Self-pay | Admitting: *Deleted

## 2022-01-30 DIAGNOSIS — Z299 Encounter for prophylactic measures, unspecified: Secondary | ICD-10-CM | POA: Diagnosis not present

## 2022-01-30 DIAGNOSIS — Z2821 Immunization not carried out because of patient refusal: Secondary | ICD-10-CM | POA: Diagnosis not present

## 2022-01-30 DIAGNOSIS — I10 Essential (primary) hypertension: Secondary | ICD-10-CM | POA: Diagnosis not present

## 2022-01-30 DIAGNOSIS — R350 Frequency of micturition: Secondary | ICD-10-CM | POA: Diagnosis not present

## 2022-01-30 DIAGNOSIS — Z6831 Body mass index (BMI) 31.0-31.9, adult: Secondary | ICD-10-CM | POA: Diagnosis not present

## 2022-02-08 DIAGNOSIS — M1991 Primary osteoarthritis, unspecified site: Secondary | ICD-10-CM | POA: Diagnosis not present

## 2022-02-08 DIAGNOSIS — E038 Other specified hypothyroidism: Secondary | ICD-10-CM | POA: Diagnosis not present

## 2022-02-08 DIAGNOSIS — I10 Essential (primary) hypertension: Secondary | ICD-10-CM | POA: Diagnosis not present

## 2022-02-08 DIAGNOSIS — F32A Depression, unspecified: Secondary | ICD-10-CM | POA: Diagnosis not present

## 2022-02-26 DIAGNOSIS — R300 Dysuria: Secondary | ICD-10-CM | POA: Diagnosis not present

## 2022-02-26 DIAGNOSIS — Z124 Encounter for screening for malignant neoplasm of cervix: Secondary | ICD-10-CM | POA: Diagnosis not present

## 2022-02-26 DIAGNOSIS — Z01419 Encounter for gynecological examination (general) (routine) without abnormal findings: Secondary | ICD-10-CM | POA: Diagnosis not present

## 2022-03-09 ENCOUNTER — Ambulatory Visit (INDEPENDENT_AMBULATORY_CARE_PROVIDER_SITE_OTHER): Payer: Medicare Other | Admitting: Dermatology

## 2022-03-09 ENCOUNTER — Encounter: Payer: Self-pay | Admitting: Dermatology

## 2022-03-09 DIAGNOSIS — L821 Other seborrheic keratosis: Secondary | ICD-10-CM

## 2022-03-09 DIAGNOSIS — Z1283 Encounter for screening for malignant neoplasm of skin: Secondary | ICD-10-CM

## 2022-03-10 DIAGNOSIS — I10 Essential (primary) hypertension: Secondary | ICD-10-CM | POA: Diagnosis not present

## 2022-03-10 DIAGNOSIS — F32A Depression, unspecified: Secondary | ICD-10-CM | POA: Diagnosis not present

## 2022-03-10 DIAGNOSIS — E038 Other specified hypothyroidism: Secondary | ICD-10-CM | POA: Diagnosis not present

## 2022-03-27 ENCOUNTER — Encounter: Payer: Self-pay | Admitting: Dermatology

## 2022-03-27 NOTE — Progress Notes (Signed)
   Follow-Up Visit   Subjective  Alexa Wilson is a 76 y.o. female who presents for the following: Annual Exam (Facial tan lesions and under breast and legs she dont like).  Skin check, multiple discolored spots under breasts more than face Location:  Duration:  Quality:  Associated Signs/Symptoms: Modifying Factors:  Severity:  Timing: Context:   Objective  Well appearing patient in no apparent distress; mood and affect are within normal limits. Waist up skin examination: No atypical pigmented lesions (all checked with dermoscopy), no nonmelanoma skin cancers.  Left Inframammary Fold, Right Inframammary Fold Multiple tan flattopped textured 3 to 6 mm papules, mostly Emerson inframammary fold area    All sun exposed areas plus back examined.  Is beneath undergarment not fully examined.   Assessment & Plan    Encounter for screening for malignant neoplasm of skin  Yearly skin examination   Seborrheic keratosis (2) Left Inframammary Fold; Right Inframammary Fold  Discussed options of leaving, freezing, scraping off.  For now, no intervention initiated.      I, Lavonna Monarch, MD, have reviewed all documentation for this visit.  The documentation on 03/27/22 for the exam, diagnosis, procedures, and orders are all accurate and complete.

## 2022-04-09 DIAGNOSIS — I10 Essential (primary) hypertension: Secondary | ICD-10-CM | POA: Diagnosis not present

## 2022-04-09 DIAGNOSIS — E038 Other specified hypothyroidism: Secondary | ICD-10-CM | POA: Diagnosis not present

## 2022-04-09 DIAGNOSIS — F32A Depression, unspecified: Secondary | ICD-10-CM | POA: Diagnosis not present

## 2022-04-14 DIAGNOSIS — E7800 Pure hypercholesterolemia, unspecified: Secondary | ICD-10-CM | POA: Diagnosis not present

## 2022-04-14 DIAGNOSIS — I10 Essential (primary) hypertension: Secondary | ICD-10-CM | POA: Diagnosis not present

## 2022-04-14 DIAGNOSIS — Z789 Other specified health status: Secondary | ICD-10-CM | POA: Diagnosis not present

## 2022-04-14 DIAGNOSIS — Z299 Encounter for prophylactic measures, unspecified: Secondary | ICD-10-CM | POA: Diagnosis not present

## 2022-04-28 ENCOUNTER — Other Ambulatory Visit: Payer: Self-pay

## 2022-04-28 ENCOUNTER — Emergency Department (HOSPITAL_COMMUNITY)
Admission: EM | Admit: 2022-04-28 | Discharge: 2022-04-28 | Disposition: A | Discharge status: Home / Self Care | Payer: Medicare Other | Attending: Emergency Medicine | Admitting: Emergency Medicine

## 2022-04-28 ENCOUNTER — Encounter (HOSPITAL_COMMUNITY): Payer: Self-pay

## 2022-04-28 DIAGNOSIS — R8290 Unspecified abnormal findings in urine: Secondary | ICD-10-CM | POA: Insufficient documentation

## 2022-04-28 DIAGNOSIS — B3731 Acute candidiasis of vulva and vagina: Secondary | ICD-10-CM | POA: Insufficient documentation

## 2022-04-28 DIAGNOSIS — L740 Miliaria rubra: Secondary | ICD-10-CM | POA: Diagnosis not present

## 2022-04-28 DIAGNOSIS — R21 Rash and other nonspecific skin eruption: Secondary | ICD-10-CM | POA: Diagnosis not present

## 2022-04-28 DIAGNOSIS — N898 Other specified noninflammatory disorders of vagina: Secondary | ICD-10-CM | POA: Diagnosis present

## 2022-04-28 LAB — COMPREHENSIVE METABOLIC PANEL
ALT: 22 U/L (ref 0–44)
AST: 46 U/L — ABNORMAL HIGH (ref 15–41)
Albumin: 4.1 g/dL (ref 3.5–5.0)
Alkaline Phosphatase: 87 U/L (ref 38–126)
Anion gap: 10 (ref 5–15)
BUN: 11 mg/dL (ref 8–23)
CO2: 24 mmol/L (ref 22–32)
Calcium: 9.5 mg/dL (ref 8.9–10.3)
Chloride: 106 mmol/L (ref 98–111)
Creatinine, Ser: 0.99 mg/dL (ref 0.44–1.00)
GFR, Estimated: 59 mL/min — ABNORMAL LOW (ref >60)
Glucose, Bld: 104 mg/dL — ABNORMAL HIGH (ref 70–99)
Potassium: 3.7 mmol/L (ref 3.5–5.1)
Sodium: 140 mmol/L (ref 135–145)
Total Bilirubin: 1.1 mg/dL (ref 0.3–1.2)
Total Protein: 7.2 g/dL (ref 6.5–8.1)

## 2022-04-28 LAB — CBC WITH DIFFERENTIAL/PLATELET
Abs Immature Granulocytes: 0.01 10*3/uL (ref 0.00–0.07)
Basophils Absolute: 0.1 10*3/uL (ref 0.0–0.1)
Basophils Relative: 1 %
Eosinophils Absolute: 0.1 10*3/uL (ref 0.0–0.5)
Eosinophils Relative: 3 %
HCT: 42.1 % (ref 36.0–46.0)
Hemoglobin: 13.9 g/dL (ref 12.0–15.0)
Immature Granulocytes: 0 %
Lymphocytes Relative: 39 %
Lymphs Abs: 1.9 10*3/uL (ref 0.7–4.0)
MCH: 33.3 pg (ref 26.0–34.0)
MCHC: 33 g/dL (ref 30.0–36.0)
MCV: 100.7 fL — ABNORMAL HIGH (ref 80.0–100.0)
Monocytes Absolute: 0.4 10*3/uL (ref 0.1–1.0)
Monocytes Relative: 8 %
Neutro Abs: 2.3 10*3/uL (ref 1.7–7.7)
Neutrophils Relative %: 49 %
Platelets: 206 10*3/uL (ref 150–400)
RBC: 4.18 MIL/uL (ref 3.87–5.11)
RDW: 12.2 % (ref 11.5–15.5)
WBC: 4.7 10*3/uL (ref 4.0–10.5)
nRBC: 0 % (ref 0.0–0.2)

## 2022-04-28 LAB — URINALYSIS, ROUTINE W REFLEX MICROSCOPIC
Bilirubin Urine: NEGATIVE
Glucose, UA: NEGATIVE mg/dL
Ketones, ur: NEGATIVE mg/dL
Nitrite: NEGATIVE
Protein, ur: NEGATIVE mg/dL
Specific Gravity, Urine: 1.008 (ref 1.005–1.030)
WBC, UA: >50 WBC/hpf — ABNORMAL HIGH (ref 0–5)
pH: 5 (ref 5.0–8.0)

## 2022-04-28 MED ORDER — NYSTATIN 100000 UNIT/GM EX CREA: TOPICAL_CREAM | CUTANEOUS | 1 refills | Status: DC

## 2022-04-28 MED ORDER — FLUCONAZOLE 150 MG PO TABS
150.0000 mg | ORAL_TABLET | Freq: Once | ORAL | Status: AC
  Administered 2022-04-28: 150 mg via ORAL
  Filled 2022-04-28: qty 1

## 2022-04-28 NOTE — ED Provider Triage Note (Signed)
Emergency Medicine Provider Triage Evaluation Note  Alexa Wilson , a 76 y.o. female  was evaluated in triage.  Pt complains of itching and burning in body folds.  Patient states that she does not know how long her symptoms have been for.  She has seen multiple dermatologist in the past for said condition.  Areas of burning/itching located in folds of pannus, axillary folds, inguinal creases.  Denies vaginal discharge/bleeding/itching.  Denies urinary symptoms.  Denies fever, chills, night sweats, chest pain, shortness of breath, abdominal pain, nausea/vomiting/diarrhea.  She has tried no medication for this..  Review of Systems  Positive: See above Negative:   Physical Exam  BP (!) 157/84 (BP Location: Right Arm)   Pulse 60   Temp (!) 97.4 F (36.3 C) (Oral)   Resp 16   Ht 5\' 6"  (1.676 m)   Wt 86.6 kg   SpO2 100%   BMI 30.83 kg/m  Gen:   Awake, no distress   Resp:  Normal effort  MSK:   Moves extremities without difficulty  Other:  No tenderness to palpation of abdomen.  Minimal erythema noted in folds of pannus.  Rest of body not examined but she states it is the same as pannus fold.  Medical Decision Making  Medically screening exam initiated at 1:18 PM.  Appropriate orders placed.  DALIA KANDA was informed that the remainder of the evaluation will be completed by another provider, this initial triage assessment does not replace that evaluation, and the importance of remaining in the ED until their evaluation is complete.     Peter Garter, [place] 04/28/22 1319

## 2022-04-28 NOTE — ED Provider Notes (Signed)
[facility] EMERGENCY DEPARTMENT Provider Note   CSN: [id number] Arrival date & time: 04/28/22  1254     History Chief Complaint  Patient presents with   Vaginitis    Alexa Wilson is a 76 y.o. female with h/o panic disorder presents to the ED for evaluation of burning sensation in between her leg and her vagina for the past 2-3 months. The patient reports that the burning worsened last night which is why she came in today. She also reports some itching under her panus, as well as in between and under her breasts. She reports that those areas are hot and sweaty. No previous interventions done. She denies any vaginal pain, vaginal discharge, vaginal bleeding, abdominal pain, or fever. She denies any dysuria or hematuria.   HPI     Home Medications Prior to Admission medications   Medication Sig Start Date End Date Taking? Authorizing Provider  nystatin cream (MYCOSTATIN) Apply to affected area 2 times daily 04/28/22  Yes Sherrell Puller, PA-C  acetaminophen (TYLENOL) 500 MG tablet Take 500 mg by mouth every 6 (six) hours as needed for mild pain or moderate pain. Patient not taking: Reported on 03/09/2022    [provider]  alprazolam Duanne Moron) 2 MG tablet Take 1-2 mg by mouth every 8 (eight) hours. Take 1/2 tablet 8 am and 8 pm then take 1 tablet at 2 pm  Taking 4 mg daily.    [provider]  cholecalciferol (VITAMIN D3) 25 MCG (1000 UNIT) tablet Take 1,000 Units by mouth daily.    [provider]  docusate sodium (STOOL SOFTENER) 100 MG capsule Take 1 capsule (100 mg total) by mouth 2 (two) times daily. Patient taking differently: Take 100 mg by mouth daily. 06/07/18   Shelly Coss, MD  levothyroxine (SYNTHROID) 25 MCG tablet Take 25 mcg by mouth daily. 12/11/19   [provider]  metoprolol succinate (TOPROL-XL) 25 MG 24 hr tablet Take 0.5 tablets (12.5 mg total) by mouth daily. 09/12/21   Lelon Perla, MD  polyethylene  glycol-electrolytes (TRILYTE) 420 g solution Take 4,000 mLs by mouth as directed. 01/31/20   Rourk, Cristopher Estimable, MD  rosuvastatin (CRESTOR) 10 MG tablet Take 20 mg by mouth at bedtime.  11/10/19   [provider]      Allergies    Ciprofloxacin, Codeine, Metronidazole, Morphine and related, and Other    Review of Systems   Review of Systems  Constitutional:  Negative for chills and fever.  Respiratory:  Negative for shortness of breath.   Cardiovascular:  Negative for chest pain.  Gastrointestinal:  Negative for abdominal pain, nausea and vomiting.  Genitourinary:  Negative for dysuria, frequency, hematuria, urgency, vaginal bleeding, vaginal discharge and vaginal pain.  Skin:  Positive for rash.    Physical Exam Updated Vital Signs BP 133/72 (BP Location: Right Arm)   Pulse 77   Temp 97.9 F (36.6 C) (Oral)   Resp 16   Ht '5\' 6"'$  (1.676 m)   Wt 86.6 kg   SpO2 98%   BMI 30.83 kg/m  Physical Exam Vitals and nursing note reviewed. Exam conducted with a chaperone present Lysbeth Galas, RN).  Constitutional:      Appearance: Normal appearance.  Eyes:     General: No scleral icterus. Pulmonary:     Effort: Pulmonary effort is normal. No respiratory distress.  Genitourinary:    Labia:        Right: Rash present.        Left:  Rash present.      Comments: Some erythema/irritation seen in the inguinal crease between the patients thigh and vagina bilaterally. No blistering or lesions notes. No overt vaginal discharge noted. No LAD.  Skin:    General: Skin is dry.     Findings: No rash.     Comments: The patient has mild erythema and irritation noted between breasts, under bilateral breasts, and under her panus. No skin sloughing. No blistering or lesions noted.   Neurological:     General: No focal deficit present.     Mental Status: She is alert. Mental status is at baseline.  Psychiatric:        Mood and Affect: Mood normal.     ED Results / Procedures / Treatments    Labs (all labs ordered are listed, but only abnormal results are displayed) Labs Reviewed  URINALYSIS, ROUTINE W REFLEX MICROSCOPIC - Abnormal; Notable for the following components:      Result Value   APPearance CLOUDY (*)    Hgb urine dipstick SMALL (*)    Leukocytes,Ua LARGE (*)    WBC, UA >50 (*)    Bacteria, UA FEW (*)    All other components within normal limits  CBC WITH DIFFERENTIAL/PLATELET - Abnormal; Notable for the following components:   MCV 100.7 (*)    All other components within normal limits  COMPREHENSIVE METABOLIC PANEL - Abnormal; Notable for the following components:   Glucose, Bld 104 (*)    AST 46 (*)    GFR, Estimated 59 (*)    All other components within normal limits  URINE CULTURE    EKG None  Radiology No results found.  Procedures Procedures   Medications Ordered in ED Medications  fluconazole (DIFLUCAN) tablet 150 mg (has no administration in time range)    ED Course/ Medical Decision Making/ A&P                           Medical Decision Making Amount and/or Complexity of Data Reviewed Labs: ordered.  Risk Prescription drug management.   76 year old female presents the emergency room for evaluation of burning pain between her vagina and thigh bilaterally as well as a itching area underneath her pannus, between her breast, and under her bilateral breast.  Differential diagnosis includes was not limited to lesions, yeast infection, SJS, TENS, heat rash, atopic dermatitis, allergic dermatitis, wounds.  Vital signs are unremarkable.  Physical exam is pertinent for the patient has mild erythema and irritation noted between breasts, under bilateral breasts, and under her panus. No skin sloughing. No blistering or lesions noted. Some erythema/irritation seen in the inguinal crease between the patients thigh and vagina bilaterally. No blistering or lesions notes. No overt vaginal discharge noted. No LAD.   I independently reviewed and  interpreted the patient's labs.  CMP shows mildly elevated glucose at 104 but not fasting.  Mild increase in her AST of 46 otherwise no LFT abnormalities.  Urinalysis shows cloudy urine with small amount of hemoglobin.  There is large leuks with greater than 50 white blood cells however there is only few bacteria seen.  There is 11-20 squamous epithelials and budding yeast present.  Likely dirty catch.  CBC shows no leukocytosis or anemia.  We will order her Diflucan while she is in the emergency department for vaginal yeast infection.  We will also send off a culture of her urine although I suspect it is from her dirty catch. She has  no urinary symptoms.  We will send the patient home with topical nystatin to apply to her inguinal creases and under her breast and pannus for a yeast infection of the skin.  I advised her to keep these areas while trying to wear cotton close to avoid perforating as much.  We discussed using things like cornstarch or Goldbond powder to keep the areas dry.  Strict return precautions and red flag symptoms were discussed.  Recommended follow-up with her PCP.  Patient verbalized understanding agrees to plan.  Patient is stable and being discharged home in good condition.  I discussed this case with my attending physician who cosigned this note including patient's presenting symptoms, physical exam, and planned diagnostics and interventions. Attending physician stated agreement with plan or made changes to plan which were implemented.   Final Clinical Impression(s) / ED Diagnoses Final diagnoses:  Vaginal yeast infection  Heat rash    Rx / DC Orders ED Discharge Orders          Ordered    nystatin cream (MYCOSTATIN)        04/28/22 2239              Sherrell Puller, PA-C 04/29/22 7356    Blanchie Dessert, MD 04/30/22 7014

## 2022-04-29 LAB — URINE CULTURE: Culture: <10000 — AB

## 2022-05-01 DIAGNOSIS — I10 Essential (primary) hypertension: Secondary | ICD-10-CM | POA: Diagnosis not present

## 2022-05-01 DIAGNOSIS — Z299 Encounter for prophylactic measures, unspecified: Secondary | ICD-10-CM | POA: Diagnosis not present

## 2022-05-01 DIAGNOSIS — B372 Candidiasis of skin and nail: Secondary | ICD-10-CM | POA: Diagnosis not present

## 2022-05-01 DIAGNOSIS — R110 Nausea: Secondary | ICD-10-CM | POA: Diagnosis not present

## 2022-05-09 DIAGNOSIS — I10 Essential (primary) hypertension: Secondary | ICD-10-CM | POA: Diagnosis not present

## 2022-05-09 DIAGNOSIS — E038 Other specified hypothyroidism: Secondary | ICD-10-CM | POA: Diagnosis not present

## 2022-05-09 DIAGNOSIS — F32A Depression, unspecified: Secondary | ICD-10-CM | POA: Diagnosis not present

## 2022-06-11 DIAGNOSIS — F419 Anxiety disorder, unspecified: Secondary | ICD-10-CM | POA: Diagnosis not present

## 2022-06-11 DIAGNOSIS — K5792 Diverticulitis of intestine, part unspecified, without perforation or abscess without bleeding: Secondary | ICD-10-CM | POA: Diagnosis not present

## 2022-06-11 DIAGNOSIS — Z299 Encounter for prophylactic measures, unspecified: Secondary | ICD-10-CM | POA: Diagnosis not present

## 2022-06-11 DIAGNOSIS — I10 Essential (primary) hypertension: Secondary | ICD-10-CM | POA: Diagnosis not present

## 2022-06-11 DIAGNOSIS — Z6831 Body mass index (BMI) 31.0-31.9, adult: Secondary | ICD-10-CM | POA: Diagnosis not present

## 2022-07-03 DIAGNOSIS — Z713 Dietary counseling and surveillance: Secondary | ICD-10-CM | POA: Diagnosis not present

## 2022-07-03 DIAGNOSIS — Z6831 Body mass index (BMI) 31.0-31.9, adult: Secondary | ICD-10-CM | POA: Diagnosis not present

## 2022-07-03 DIAGNOSIS — Z299 Encounter for prophylactic measures, unspecified: Secondary | ICD-10-CM | POA: Diagnosis not present

## 2022-07-03 DIAGNOSIS — I700 Atherosclerosis of aorta: Secondary | ICD-10-CM | POA: Diagnosis not present

## 2022-07-03 DIAGNOSIS — W57XXXA Bitten or stung by nonvenomous insect and other nonvenomous arthropods, initial encounter: Secondary | ICD-10-CM | POA: Diagnosis not present

## 2022-07-07 ENCOUNTER — Encounter: Payer: Self-pay | Admitting: *Deleted

## 2022-07-29 DIAGNOSIS — K5732 Diverticulitis of large intestine without perforation or abscess without bleeding: Secondary | ICD-10-CM | POA: Diagnosis not present

## 2022-07-29 DIAGNOSIS — Z299 Encounter for prophylactic measures, unspecified: Secondary | ICD-10-CM | POA: Diagnosis not present

## 2022-07-29 DIAGNOSIS — I10 Essential (primary) hypertension: Secondary | ICD-10-CM | POA: Diagnosis not present

## 2022-07-30 ENCOUNTER — Emergency Department (HOSPITAL_COMMUNITY): Payer: Medicare Other

## 2022-07-30 ENCOUNTER — Emergency Department (HOSPITAL_COMMUNITY)
Admission: EM | Admit: 2022-07-30 | Discharge: 2022-07-31 | Disposition: A | Discharge status: Home / Self Care | Payer: Medicare Other | Attending: Emergency Medicine | Admitting: Emergency Medicine

## 2022-07-30 ENCOUNTER — Other Ambulatory Visit: Payer: Self-pay

## 2022-07-30 ENCOUNTER — Encounter (HOSPITAL_COMMUNITY): Payer: Self-pay

## 2022-07-30 DIAGNOSIS — R109 Unspecified abdominal pain: Secondary | ICD-10-CM | POA: Diagnosis present

## 2022-07-30 DIAGNOSIS — K5732 Diverticulitis of large intestine without perforation or abscess without bleeding: Secondary | ICD-10-CM | POA: Diagnosis not present

## 2022-07-30 DIAGNOSIS — R1032 Left lower quadrant pain: Secondary | ICD-10-CM | POA: Diagnosis not present

## 2022-07-30 DIAGNOSIS — I700 Atherosclerosis of aorta: Secondary | ICD-10-CM | POA: Diagnosis not present

## 2022-07-30 DIAGNOSIS — K5792 Diverticulitis of intestine, part unspecified, without perforation or abscess without bleeding: Secondary | ICD-10-CM | POA: Diagnosis not present

## 2022-07-30 LAB — COMPREHENSIVE METABOLIC PANEL
ALT: 21 U/L (ref 0–44)
AST: 32 U/L (ref 15–41)
Albumin: 4.1 g/dL (ref 3.5–5.0)
Alkaline Phosphatase: 100 U/L (ref 38–126)
Anion gap: 9 (ref 5–15)
BUN: 11 mg/dL (ref 8–23)
CO2: 26 mmol/L (ref 22–32)
Calcium: 9.1 mg/dL (ref 8.9–10.3)
Chloride: 104 mmol/L (ref 98–111)
Creatinine, Ser: 0.92 mg/dL (ref 0.44–1.00)
GFR, Estimated: >60 mL/min (ref >60)
Glucose, Bld: 111 mg/dL — ABNORMAL HIGH (ref 70–99)
Potassium: 3.6 mmol/L (ref 3.5–5.1)
Sodium: 139 mmol/L (ref 135–145)
Total Bilirubin: 1.9 mg/dL — ABNORMAL HIGH (ref 0.3–1.2)
Total Protein: 7.8 g/dL (ref 6.5–8.1)

## 2022-07-30 LAB — URINALYSIS, ROUTINE W REFLEX MICROSCOPIC
Bilirubin Urine: NEGATIVE
Glucose, UA: NEGATIVE mg/dL
Ketones, ur: NEGATIVE mg/dL
Nitrite: NEGATIVE
Protein, ur: NEGATIVE mg/dL
Specific Gravity, Urine: 1.011 (ref 1.005–1.030)
pH: 5 (ref 5.0–8.0)

## 2022-07-30 LAB — CBC
HCT: 41 % (ref 36.0–46.0)
Hemoglobin: 13.6 g/dL (ref 12.0–15.0)
MCH: 33.3 pg (ref 26.0–34.0)
MCHC: 33.2 g/dL (ref 30.0–36.0)
MCV: 100.5 fL — ABNORMAL HIGH (ref 80.0–100.0)
Platelets: 222 10*3/uL (ref 150–400)
RBC: 4.08 MIL/uL (ref 3.87–5.11)
RDW: 13 % (ref 11.5–15.5)
WBC: 9.4 10*3/uL (ref 4.0–10.5)
nRBC: 0 % (ref 0.0–0.2)

## 2022-07-30 LAB — LIPASE, BLOOD: Lipase: 35 U/L (ref 11–51)

## 2022-07-30 MED ORDER — SODIUM CHLORIDE 0.9 % IV BOLUS
1000.0000 mL | Freq: Once | INTRAVENOUS | Status: AC
  Administered 2022-07-30: 1000 mL via INTRAVENOUS

## 2022-07-30 MED ORDER — ONDANSETRON HCL 4 MG/2ML IJ SOLN
4.0000 mg | Freq: Once | INTRAMUSCULAR | Status: AC
  Administered 2022-07-30: 4 mg via INTRAVENOUS
  Filled 2022-07-30: qty 2

## 2022-07-30 MED ORDER — IOHEXOL 300 MG/ML  SOLN
100.0000 mL | Freq: Once | INTRAMUSCULAR | Status: AC | PRN
  Administered 2022-07-30: 75 mL via INTRAVENOUS

## 2022-07-30 NOTE — ED Provider Notes (Signed)
[facility] EMERGENCY DEPARTMENT Provider Note   CSN: [id number] Arrival date & time: 07/30/22  1428     History  Chief Complaint  Patient presents with   Abdominal Pain    Alexa Wilson is a 76 y.o. female.  With past medical history of hyperlipidemia, diverticulosis, GERD who presents to the emergency department with abdominal pain.  Patient states that she has had aching abdominal pain that has been constant for 3 days.  She states that it is primarily left-sided but intermittently will radiate to the right side.  She describes feeling bloated and has had nausea without vomiting.  She began having diarrhea last night and had 3 episodes of nonbloody diarrhea this morning.  She has had mild decreased p.o. intake.  Denies dysuria or fevers. States that she saw her primary care provider who gave her Augmentin. She has taken one dose.    Abdominal Pain Associated symptoms: diarrhea and nausea   Associated symptoms: no dysuria, no fever and no vomiting        Home Medications Prior to Admission medications   Medication Sig Start Date End Date Taking? Authorizing Provider  ondansetron (ZOFRAN) 4 MG tablet Take 1 tablet (4 mg total) by mouth every 6 (six) hours. 07/31/22  Yes Mickie Hillier, PA-C  acetaminophen (TYLENOL) 500 MG tablet Take 500 mg by mouth every 6 (six) hours as needed for mild pain or moderate pain. Patient not taking: Reported on 03/09/2022    [provider]  alprazolam Duanne Moron) 2 MG tablet Take 1-2 mg by mouth every 8 (eight) hours. Take 1/2 tablet 8 am and 8 pm then take 1 tablet at 2 pm  Taking 4 mg daily.    [provider]  cholecalciferol (VITAMIN D3) 25 MCG (1000 UNIT) tablet Take 1,000 Units by mouth daily.    [provider]  docusate sodium (STOOL SOFTENER) 100 MG capsule Take 1 capsule (100 mg total) by mouth 2 (two) times daily. Patient taking differently: Take 100 mg by mouth daily. 06/07/18   Shelly Coss, MD   levothyroxine (SYNTHROID) 25 MCG tablet Take 25 mcg by mouth daily. 12/11/19   [provider]  metoprolol succinate (TOPROL-XL) 25 MG 24 hr tablet Take 0.5 tablets (12.5 mg total) by mouth daily. 09/12/21   Lelon Perla, MD  nystatin cream (MYCOSTATIN) Apply to affected area 2 times daily 04/28/22   Sherrell Puller, PA-C  polyethylene glycol-electrolytes (TRILYTE) 420 g solution Take 4,000 mLs by mouth as directed. 01/31/20   Rourk, Cristopher Estimable, MD  rosuvastatin (CRESTOR) 10 MG tablet Take 20 mg by mouth at bedtime.  11/10/19   [provider]      Allergies    Ciprofloxacin, Codeine, Metronidazole, Morphine and related, and Other    Review of Systems   Review of Systems  Constitutional:  Positive for appetite change. Negative for fever.  Gastrointestinal:  Positive for abdominal distention, abdominal pain, diarrhea and nausea. Negative for vomiting.  Genitourinary:  Negative for dysuria.  All other systems reviewed and are negative.   Physical Exam Updated Vital Signs BP (!) 140/91   Pulse 73   Temp 98.1 F (36.7 C) (Oral)   Resp 16   Ht '5\' 6"'$  (1.676 m)   Wt 89.8 kg   SpO2 99%   BMI 31.96 kg/m  Physical Exam Vitals and nursing note reviewed.  Constitutional:      General: She is not in acute distress.    Appearance: Normal appearance. She is  well-developed. She is ill-appearing. She is not toxic-appearing.  HENT:     Head: Normocephalic.  Eyes:     General: No scleral icterus.    Extraocular Movements: Extraocular movements intact.  Cardiovascular:     Rate and Rhythm: Normal rate and regular rhythm.     Heart sounds: Normal heart sounds. No murmur heard. Pulmonary:     Effort: Pulmonary effort is normal. No respiratory distress.     Breath sounds: Normal breath sounds.  Abdominal:     General: Abdomen is protuberant. Bowel sounds are decreased. There is no distension.     Palpations: Abdomen is soft.     Tenderness: There is abdominal tenderness in  the left upper quadrant and left lower quadrant. There is guarding. There is no rebound. Negative signs include Murphy's sign and McBurney's sign.  Musculoskeletal:        General: Normal range of motion.     Cervical back: Neck supple.  Skin:    General: Skin is warm and dry.     Capillary Refill: Capillary refill takes less than 2 seconds.  Neurological:     General: No focal deficit present.     Mental Status: She is alert and oriented to person, place, and time. Mental status is at baseline.  Psychiatric:        Mood and Affect: Mood normal.        Behavior: Behavior normal.        Thought Content: Thought content normal.        Judgment: Judgment normal.    ED Results / Procedures / Treatments   Labs (all labs ordered are listed, but only abnormal results are displayed) Labs Reviewed  COMPREHENSIVE METABOLIC PANEL - Abnormal; Notable for the following components:      Result Value   Glucose, Bld 111 (*)    Total Bilirubin 1.9 (*)    All other components within normal limits  CBC - Abnormal; Notable for the following components:   MCV 100.5 (*)    All other components within normal limits  URINALYSIS, ROUTINE W REFLEX MICROSCOPIC - Abnormal; Notable for the following components:   Hgb urine dipstick SMALL (*)    Leukocytes,Ua SMALL (*)    Bacteria, UA RARE (*)    All other components within normal limits  LIPASE, BLOOD   EKG None  Radiology No results found.  Procedures Procedures   Medications Ordered in ED Medications  ondansetron (ZOFRAN) injection 4 mg (4 mg Intravenous Given 07/30/22 2248)  iohexol (OMNIPAQUE) 300 MG/ML solution 100 mL (75 mLs Intravenous Contrast Given 07/30/22 2315)  sodium chloride 0.9 % bolus 1,000 mL (0 mLs Intravenous Stopped 07/31/22 0013)    ED Course/ Medical Decision Making/ A&P                           Medical Decision Making Amount and/or Complexity of Data Reviewed Labs: ordered. Radiology: ordered.  Risk Prescription  drug management.   This patient presents to the ED with chief complaint(s) of abdominal pain with pertinent past medical history of GERD, hyperlipidemia, IBS, diverticulosis which further complicates the presenting complaint. The complaint involves an extensive differential diagnosis and also carries with it a high risk of complications and morbidity.    The differential diagnosis includes Acute hepatobiliary disease, pancreatitis, appendicitis, PUD, gastritis, SBO, diverticulitis, colitis, viral gastroenteritis, Crohn's, UC, vascular catastrophe, UTI, pyelonephritis, renal stone, obstructed stone, infected stone, ovarian torsion, ectopic pregnancy, TOA, PID, STD,  etc.    Additional history obtained: Additional history obtained from  not available Records reviewed Care Everywhere/External Records and Primary Care Documents  ED Course and Reassessment: 76 year old female who presents to the emergency department with left-sided abdominal pain with nausea vomiting and diarrhea.  She is overall well-appearing, nontoxic, nonseptic in appearance.  There is no peritonitic findings on her physical exam.  Her vitals are stable.  Was seen by her PCP and diagnosed with diverticulitis although I do not see a note from this or imaging.  She was placed on Augmentin when she is only taken 1 dose of. We will get basic labs including a lipase, CT abdomen pelvis given her left lower quadrant abdominal pain.  Given her a liter of IV fluids and Zofran which improved her nausea symptoms.  Her labs are relatively unremarkable.  There is no leukocytosis, transaminitis, AKI or electrolyte derangement.  Her urine is not consistent with UTI.  Lipase is negative.  Work-up is inconsistent with other etiologies abdominal pain such as acute hepatobiliary disease, pancreatitis, appendicitis, gastritis, SBO, vascular catastrophe, GU.  CT abdomen pelvis is consistent with uncomplicated diverticulitis.  Discussed the findings with  the patient.  Discussed that she is already on the appropriate antibiotics and should continue taking her Augmentin until completion.  We will also prescribe her with Zofran.  She is agreeable to this.  Given return precautions for significantly worsening symptoms that she verbalized understanding.  Otherwise feel that she is safe for discharge at this time.  Independent labs interpretation:  The following labs were independently interpreted: CBC, CMP, UA, lipase are unremarkable  Independent visualization of imaging: - I independently visualized the following imaging with scope of interpretation limited to determining acute life threatening conditions related to emergency care: CT abdomen pelvis, which revealed  1. Diverticulitis involving in the rectosigmoid colon which is new  location from the previous exam. Minimal residual inflammatory  changes are seen at the sigmoid colon in the left lower quadrant. No  abscess or free air.    Consultation: - Consulted or discussed management/test interpretation w/ external professional: Not indicated  Consideration for admission or further workup: Not indicated Social Determinants of health: Low health literacy Final Clinical Impression(s) / ED Diagnoses Final diagnoses:  Diverticulitis of colon    Rx / DC Orders ED Discharge Orders          Ordered    ondansetron (ZOFRAN) 4 MG tablet  Every 6 hours        07/31/22 0004              Mickie Hillier, PA-C 08/05/22 1441    Varney Biles, MD 08/05/22 1556

## 2022-07-30 NOTE — Discharge Instructions (Addendum)
You were seen in the emergency department today for diverticulitis. You have evidence of this on your imaging. Please continue taking your Augmentin. I have sent you a prescription for nausea. Please take ibuprofen or tylenol for pain relief.. Please return to the emergency department for severely worsening pain with fever, or inability to eat or drink fluids. Please complete your antibiotics and follow-up with your primary care provider.

## 2022-07-30 NOTE — ED Triage Notes (Signed)
Pt presents to ED with complaints of lower abdominal pain, nausea and headache. Denies vomiting. States had some diarrhea. PCP started treating her for diverticulitis, started abx today but pain is worse

## 2022-07-31 MED ORDER — ONDANSETRON HCL 4 MG PO TABS: 4.0000 mg | ORAL_TABLET | Freq: Four times a day (QID) | ORAL | 0 refills | Status: AC

## 2022-08-04 ENCOUNTER — Telehealth: Payer: Self-pay | Admitting: *Deleted

## 2022-08-04 NOTE — Telephone Encounter (Signed)
     Patient  visit on 07/31/2022  at [facility] was for [place]  Have you been able to follow up with your primary care physician?no  The patient was able to obtain any needed medicine or equipment.  Are there diet recommendations that you are having difficulty following?  Patient expresses understanding of discharge instructions and education provided has no other needs at this time.    [place] [phone] [address] , [place] [address] Email : [email or web address]

## 2022-08-11 DIAGNOSIS — J329 Chronic sinusitis, unspecified: Secondary | ICD-10-CM | POA: Diagnosis not present

## 2022-08-11 DIAGNOSIS — R5383 Other fatigue: Secondary | ICD-10-CM | POA: Diagnosis not present

## 2022-09-07 DIAGNOSIS — Z299 Encounter for prophylactic measures, unspecified: Secondary | ICD-10-CM | POA: Diagnosis not present

## 2022-09-07 DIAGNOSIS — I10 Essential (primary) hypertension: Secondary | ICD-10-CM | POA: Diagnosis not present

## 2022-09-07 DIAGNOSIS — R1032 Left lower quadrant pain: Secondary | ICD-10-CM | POA: Diagnosis not present

## 2022-09-08 ENCOUNTER — Other Ambulatory Visit: Payer: Self-pay

## 2022-09-08 ENCOUNTER — Encounter (HOSPITAL_COMMUNITY): Payer: Self-pay

## 2022-09-08 ENCOUNTER — Emergency Department (HOSPITAL_COMMUNITY)
Admission: EM | Admit: 2022-09-08 | Discharge: 2022-09-08 | Disposition: A | Discharge status: Home / Self Care | Payer: Medicare Other | Attending: Emergency Medicine | Admitting: Emergency Medicine

## 2022-09-08 ENCOUNTER — Emergency Department (HOSPITAL_COMMUNITY): Payer: Medicare Other

## 2022-09-08 DIAGNOSIS — K625 Hemorrhage of anus and rectum: Secondary | ICD-10-CM | POA: Diagnosis not present

## 2022-09-08 DIAGNOSIS — K5730 Diverticulosis of large intestine without perforation or abscess without bleeding: Secondary | ICD-10-CM | POA: Insufficient documentation

## 2022-09-08 DIAGNOSIS — D72829 Elevated white blood cell count, unspecified: Secondary | ICD-10-CM | POA: Insufficient documentation

## 2022-09-08 DIAGNOSIS — Z79899 Other long term (current) drug therapy: Secondary | ICD-10-CM | POA: Insufficient documentation

## 2022-09-08 DIAGNOSIS — K5792 Diverticulitis of intestine, part unspecified, without perforation or abscess without bleeding: Secondary | ICD-10-CM | POA: Diagnosis not present

## 2022-09-08 DIAGNOSIS — N134 Hydroureter: Secondary | ICD-10-CM | POA: Diagnosis not present

## 2022-09-08 DIAGNOSIS — K5900 Constipation, unspecified: Secondary | ICD-10-CM | POA: Diagnosis not present

## 2022-09-08 DIAGNOSIS — R109 Unspecified abdominal pain: Secondary | ICD-10-CM | POA: Diagnosis not present

## 2022-09-08 DIAGNOSIS — K219 Gastro-esophageal reflux disease without esophagitis: Secondary | ICD-10-CM | POA: Insufficient documentation

## 2022-09-08 DIAGNOSIS — I700 Atherosclerosis of aorta: Secondary | ICD-10-CM | POA: Diagnosis not present

## 2022-09-08 DIAGNOSIS — N1330 Unspecified hydronephrosis: Secondary | ICD-10-CM | POA: Diagnosis not present

## 2022-09-08 LAB — COMPREHENSIVE METABOLIC PANEL
ALT: 34 U/L (ref 0–44)
AST: 53 U/L — ABNORMAL HIGH (ref 15–41)
Albumin: 4.2 g/dL (ref 3.5–5.0)
Alkaline Phosphatase: 120 U/L (ref 38–126)
Anion gap: 8 (ref 5–15)
BUN: 10 mg/dL (ref 8–23)
CO2: 26 mmol/L (ref 22–32)
Calcium: 9.2 mg/dL (ref 8.9–10.3)
Chloride: 103 mmol/L (ref 98–111)
Creatinine, Ser: 0.88 mg/dL (ref 0.44–1.00)
GFR, Estimated: >60 mL/min (ref >60)
Glucose, Bld: 117 mg/dL — ABNORMAL HIGH (ref 70–99)
Potassium: 4.2 mmol/L (ref 3.5–5.1)
Sodium: 137 mmol/L (ref 135–145)
Total Bilirubin: 1.3 mg/dL — ABNORMAL HIGH (ref 0.3–1.2)
Total Protein: 8.2 g/dL — ABNORMAL HIGH (ref 6.5–8.1)

## 2022-09-08 LAB — TYPE AND SCREEN
ABO/RH(D): A NEG
Antibody Screen: NEGATIVE

## 2022-09-08 LAB — CBC
HCT: 41.8 % (ref 36.0–46.0)
Hemoglobin: 13.6 g/dL (ref 12.0–15.0)
MCH: 32.6 pg (ref 26.0–34.0)
MCHC: 32.5 g/dL (ref 30.0–36.0)
MCV: 100.2 fL — ABNORMAL HIGH (ref 80.0–100.0)
Platelets: 245 10*3/uL (ref 150–400)
RBC: 4.17 MIL/uL (ref 3.87–5.11)
RDW: 12.4 % (ref 11.5–15.5)
WBC: 11.3 10*3/uL — ABNORMAL HIGH (ref 4.0–10.5)
nRBC: 0 % (ref 0.0–0.2)

## 2022-09-08 LAB — POC OCCULT BLOOD, ED: Fecal Occult Bld: POSITIVE — AB

## 2022-09-08 MED ORDER — IOHEXOL 300 MG/ML  SOLN
100.0000 mL | Freq: Once | INTRAMUSCULAR | Status: AC | PRN
  Administered 2022-09-08: 100 mL via INTRAVENOUS

## 2022-09-08 MED ORDER — DOCUSATE SODIUM 100 MG PO CAPS: 100.0000 mg | ORAL_CAPSULE | Freq: Two times a day (BID) | ORAL | 0 refills | Status: DC

## 2022-09-08 MED ORDER — AMOXICILLIN-POT CLAVULANATE 875-125 MG PO TABS: 1.0000 | ORAL_TABLET | Freq: Two times a day (BID) | ORAL | 0 refills | Status: DC

## 2022-09-08 MED ORDER — AMOXICILLIN-POT CLAVULANATE 875-125 MG PO TABS
1.0000 | ORAL_TABLET | Freq: Once | ORAL | Status: AC
  Administered 2022-09-08: 1 via ORAL
  Filled 2022-09-08: qty 1

## 2022-09-08 MED ORDER — LIDOCAINE 3 % EX CREA: 1.0000 g | TOPICAL_CREAM | Freq: Three times a day (TID) | CUTANEOUS | 0 refills | Status: DC | PRN

## 2022-09-08 NOTE — ED Notes (Signed)
Pt ambulated to restroom. Mae Cianci Michol Emory,RN 

## 2022-09-08 NOTE — ED Notes (Addendum)
EDP at bedside  

## 2022-09-08 NOTE — ED Notes (Signed)
Patient transported to CT 

## 2022-09-08 NOTE — Discharge Instructions (Signed)
You have an infection in your distal colon called diverticulitis which is causing your pain and your bleeding symptoms.  Take the entire course of the antibiotics prescribed.  I also prescribed you a stool softener as it will be important that you do not continue to strain to try to have bowel movements as this can make this infection worse.  You have been prescribed a liquid numbing medicine called lidocaine which she can apply using a cottonball to your anus region for comfort.  Plan close follow-up care with your primary doctor for recheck of your symptoms within the next 2 to 3 days.  If you develop worse pain, heavy bleeding, weakness or fevers, also uncontrolled vomiting return here for reevaluation of your symptoms, if your symptoms worsen as described you may need to be admitted for this condition.

## 2022-09-08 NOTE — ED Notes (Signed)
Pt ambulated to restroom. She reports she feels as if she has large stool stuck in rectum. B Haniel Fix,RN

## 2022-09-08 NOTE — ED Triage Notes (Signed)
Pt presents to ED with complaints of rectal pain and bleeding. Pt states unable to have BM. Pt states last BM 4 days ago.

## 2022-09-08 NOTE — ED Provider Notes (Signed)
[facility] EMERGENCY DEPARTMENT Provider Note   CSN: [id number] Arrival date & time: 09/08/22  1821     History  Chief Complaint  Patient presents with   Rectal Bleeding    Alexa Wilson is a 76 y.o. female with a past medical history significant for diverticulosis and diverticulitis, constipation, GERD, who is current with her colonoscopies, presenting with a 4-day history of rectal pain, blood streaks on the toilet tissue with wiping and sensation of constipation, stating she has been unable to have a full solid bowel movement in 4 days although has been passing small pieces of feces with significant straining.  She denies nausea or vomiting, fevers or chills, she also denies dizziness, weakness or lightheadedness.  She has taken oral stool softeners in applied a Dulcolax suppository prior to arrival with no significant change in her symptoms.  Of note, upon first examination, patient was in the bathroom at which time she had passed several solid pieces of stool, liquid stool and there were several bloody strands of mucus in the toilet bowl, no gross blood present.   The history is provided by the patient.       Home Medications Prior to Admission medications   Medication Sig Start Date End Date Taking? Authorizing Provider  amoxicillin-clavulanate (AUGMENTIN) 875-125 MG tablet Take 1 tablet by mouth every 12 (twelve) hours. 09/08/22  Yes Morgaine Kimball, Almyra Free, PA-C  docusate sodium (COLACE) 100 MG capsule Take 1 capsule (100 mg total) by mouth every 12 (twelve) hours. 09/08/22  Yes Namish Krise, Almyra Free, PA-C  Lidocaine 3 % CREA Apply 1 g topically 3 (three) times daily as needed (rectal pain). 09/08/22  Yes Jahnasia Tatum, Almyra Free, PA-C  acetaminophen (TYLENOL) 500 MG tablet Take 500 mg by mouth every 6 (six) hours as needed for mild pain or moderate pain. Patient not taking: Reported on 03/09/2022    [provider]  alprazolam Duanne Moron) 2 MG tablet Take 1-2 mg by mouth every 8 (eight) hours. Take 1/2  tablet 8 am and 8 pm then take 1 tablet at 2 pm  Taking 4 mg daily.    [provider]  cholecalciferol (VITAMIN D3) 25 MCG (1000 UNIT) tablet Take 1,000 Units by mouth daily.    [provider]  levothyroxine (SYNTHROID) 25 MCG tablet Take 25 mcg by mouth daily. 12/11/19   [provider]  metoprolol succinate (TOPROL-XL) 25 MG 24 hr tablet Take 0.5 tablets (12.5 mg total) by mouth daily. 09/12/21   Lelon Perla, MD  nystatin cream (MYCOSTATIN) Apply to affected area 2 times daily 04/28/22   Sherrell Puller, PA-C  ondansetron (ZOFRAN) 4 MG tablet Take 1 tablet (4 mg total) by mouth every 6 (six) hours. 07/31/22   Mickie Hillier, PA-C  polyethylene glycol-electrolytes (TRILYTE) 420 g solution Take 4,000 mLs by mouth as directed. 01/31/20   Rourk, Cristopher Estimable, MD  rosuvastatin (CRESTOR) 10 MG tablet Take 20 mg by mouth at bedtime.  11/10/19   [provider]      Allergies    Ciprofloxacin, Codeine, Metronidazole, Morphine and related, and Other    Review of Systems   Review of Systems  Constitutional:  Negative for chills and fever.  HENT:  Negative for congestion.   Eyes: Negative.   Respiratory:  Negative for chest tightness and shortness of breath.   Cardiovascular:  Negative for chest pain.  Gastrointestinal:  Positive for blood in stool, constipation and rectal pain. Negative for abdominal distention, abdominal pain, nausea and vomiting.  Genitourinary:  Negative.   Musculoskeletal:  Negative for arthralgias, joint swelling and neck pain.  Skin: Negative.  Negative for rash and wound.  Neurological:  Negative for dizziness, weakness, light-headedness, numbness and headaches.  Psychiatric/Behavioral: Negative.      Physical Exam Updated Vital Signs BP 119/64   Pulse 93   Temp 99.5 F (37.5 C) (Oral)   Resp 17   Ht '5\' 6"'$  (1.676 m)   Wt 86.6 kg   SpO2 99%   BMI 30.83 kg/m  Physical Exam Vitals and nursing note reviewed. Exam conducted with a  chaperone present.  Constitutional:      Appearance: She is well-developed.  HENT:     Head: Normocephalic and atraumatic.  Eyes:     Conjunctiva/sclera: Conjunctivae normal.  Cardiovascular:     Rate and Rhythm: Normal rate and regular rhythm.     Heart sounds: Normal heart sounds.  Pulmonary:     Effort: Pulmonary effort is normal.     Breath sounds: Normal breath sounds. No wheezing.  Abdominal:     General: Bowel sounds are normal. There is no distension.     Palpations: Abdomen is soft.     Tenderness: There is no abdominal tenderness. There is no guarding.  Genitourinary:    Rectum: Tenderness present. No anal fissure or external hemorrhoid.     Comments: Rectal tenderness without anal fissure.  There is no impaction on my exam.  Musculoskeletal:        General: Normal range of motion.     Cervical back: Normal range of motion.  Skin:    General: Skin is warm and dry.  Neurological:     Mental Status: She is alert.     ED Results / Procedures / Treatments   Labs (all labs ordered are listed, but only abnormal results are displayed) Labs Reviewed  COMPREHENSIVE METABOLIC PANEL - Abnormal; Notable for the following components:      Result Value   Glucose, Bld 117 (*)    Total Protein 8.2 (*)    AST 53 (*)    Total Bilirubin 1.3 (*)    All other components within normal limits  CBC - Abnormal; Notable for the following components:   WBC 11.3 (*)    MCV 100.2 (*)    All other components within normal limits  POC OCCULT BLOOD, ED - Abnormal; Notable for the following components:   Fecal Occult Bld POSITIVE (*)    All other components within normal limits  TYPE AND SCREEN    EKG None  Radiology CT ABDOMEN PELVIS W CONTRAST  Result Date: 09/08/2022 CLINICAL DATA:  Rectal pain and bleeding. EXAM: CT ABDOMEN AND PELVIS WITH CONTRAST TECHNIQUE: Multidetector CT imaging of the abdomen and pelvis was performed using the standard protocol following bolus  administration of intravenous contrast. RADIATION DOSE REDUCTION: This exam was performed according to the departmental dose-optimization program which includes automated exposure control, adjustment of the mA and/or kV according to patient size and/or use of iterative reconstruction technique. CONTRAST:  111m OMNIPAQUE IOHEXOL 300 MG/ML  SOLN COMPARISON:  July 30, 2022 FINDINGS: Lower chest: No acute abnormality. Hepatobiliary: No focal liver abnormality is seen. Status post cholecystectomy. No biliary dilatation. Pancreas: Unremarkable. No pancreatic ductal dilatation or surrounding inflammatory changes. Spleen: Normal in size without focal abnormality. Adrenals/Urinary Tract: Adrenal glands are unremarkable. Kidneys are normal, without obstructing renal calculi or focal lesions. There is mild right-sided hydronephrosis and hydroureter. The urinary bladder is moderately distended and otherwise unremarkable. Stomach/Bowel: There  is a small hiatal hernia. The appendix is surgically absent. Stool is seen throughout the large bowel. No evidence of bowel dilatation. Moderately inflamed diverticula are seen within the mid and distal sigmoid colon. There is no evidence of associated perforation or abscess. Noninflamed diverticula are noted throughout the descending colon. There is diffuse circumferential thickening of the rectum and rectosigmoid junction with a mild-to-moderate amount of surrounding inflammatory fat stranding. Vascular/Lymphatic: Aortic atherosclerosis. No enlarged abdominal or pelvic lymph nodes. Reproductive: Uterus and bilateral adnexa are unremarkable. Other: No abdominal wall hernia or abnormality. No abdominopelvic ascites. Musculoskeletal: No acute or significant osseous findings. IMPRESSION: 1. Moderate severity acute sigmoid diverticulitis. 2. Findings suggestive of moderate marked severity proctitis. 3. Small hiatal hernia. 4. Evidence of prior cholecystectomy. 5. Aortic atherosclerosis.  Aortic Atherosclerosis (ICD10-I70.0). Electronically Signed   By: Virgina Norfolk M.D.   On: 09/08/2022 23:03    Procedures Procedures    Medications Ordered in ED Medications  amoxicillin-clavulanate (AUGMENTIN) 875-125 MG per tablet 1 tablet (has no administration in time range)  iohexol (OMNIPAQUE) 300 MG/ML solution 100 mL (100 mLs Intravenous Contrast Given 09/08/22 2241)    ED Course/ Medical Decision Making/ A&P                           Medical Decision Making Patient presenting with perceived constipation along with rectal pain and bleeding, significant straining with attempted bowel movements.  CT imaging revealing diverticulitis with extension into the rectal region/proctitis.  Her lab work, specifically her hemoglobin is stable today.  She has no history of gross large-scale bleeding, blood seen on my exam is minimal and suggests a rectal source.  Discussed treatment for her diverticulitis including clear liquid/low residue diet, antibiotics and continued treatment of constipation.  It was at this time patient provided information that she had called her PCP yesterday and was presumptively called in a prescription for Augmentin as she had had a history of diverticulitis in the past and has taken 1 tablet to date.  Review of the chart indicates she was prescribed low-dose Augmentin, I will increase her to 875/125 strength, first dose given here.  Colace prescribed, also prescribed lidocaine for topical rectal/anal use as she has significant rectal discomfort.  Strict return precautions were outlined.  Amount and/or Complexity of Data Reviewed Labs: ordered.    Details: Labs are reassuring, specifically she has a normal hemoglobin level at 13.6.  She does have a mild leukocytosis at 11.3, consistent with diverticulitis. Radiology: ordered.    Details: CT imaging per above.  Risk OTC drugs. Prescription drug management.           Final Clinical Impression(s) / ED  Diagnoses Final diagnoses:  Constipation, unspecified constipation type  Diverticulitis    Rx / DC Orders ED Discharge Orders          Ordered    amoxicillin-clavulanate (AUGMENTIN) 875-125 MG tablet  Every 12 hours        09/08/22 2321    docusate sodium (COLACE) 100 MG capsule  Every 12 hours        09/08/22 2324    Lidocaine 3 % CREA  3 times daily PRN        09/08/22 2326              Evalee Jefferson, PA-C 09/10/22 [place], [place], MD 09/11/22 [phone]

## 2022-09-08 NOTE — ED Notes (Signed)
Pt ambulated to the restroom with steady gait. Alexa Wilson

## 2022-09-14 DIAGNOSIS — Z6830 Body mass index (BMI) 30.0-30.9, adult: Secondary | ICD-10-CM | POA: Diagnosis not present

## 2022-09-14 DIAGNOSIS — Z299 Encounter for prophylactic measures, unspecified: Secondary | ICD-10-CM | POA: Diagnosis not present

## 2022-09-14 DIAGNOSIS — R5383 Other fatigue: Secondary | ICD-10-CM | POA: Diagnosis not present

## 2022-09-14 DIAGNOSIS — Z1339 Encounter for screening examination for other mental health and behavioral disorders: Secondary | ICD-10-CM | POA: Diagnosis not present

## 2022-09-14 DIAGNOSIS — Z789 Other specified health status: Secondary | ICD-10-CM | POA: Diagnosis not present

## 2022-09-14 DIAGNOSIS — Z1331 Encounter for screening for depression: Secondary | ICD-10-CM | POA: Diagnosis not present

## 2022-09-14 DIAGNOSIS — I700 Atherosclerosis of aorta: Secondary | ICD-10-CM | POA: Diagnosis not present

## 2022-09-14 DIAGNOSIS — Z7189 Other specified counseling: Secondary | ICD-10-CM | POA: Diagnosis not present

## 2022-09-14 DIAGNOSIS — Z1211 Encounter for screening for malignant neoplasm of colon: Secondary | ICD-10-CM | POA: Diagnosis not present

## 2022-09-14 DIAGNOSIS — I10 Essential (primary) hypertension: Secondary | ICD-10-CM | POA: Diagnosis not present

## 2022-09-14 DIAGNOSIS — Z0000 Encounter for general adult medical examination without abnormal findings: Secondary | ICD-10-CM | POA: Diagnosis not present

## 2022-09-14 DIAGNOSIS — Z79899 Other long term (current) drug therapy: Secondary | ICD-10-CM | POA: Diagnosis not present

## 2022-09-14 DIAGNOSIS — E7800 Pure hypercholesterolemia, unspecified: Secondary | ICD-10-CM | POA: Diagnosis not present

## 2022-10-09 DIAGNOSIS — J329 Chronic sinusitis, unspecified: Secondary | ICD-10-CM | POA: Diagnosis not present

## 2022-10-09 DIAGNOSIS — R519 Headache, unspecified: Secondary | ICD-10-CM | POA: Diagnosis not present

## 2022-10-09 DIAGNOSIS — Z299 Encounter for prophylactic measures, unspecified: Secondary | ICD-10-CM | POA: Diagnosis not present

## 2022-12-03 ENCOUNTER — Ambulatory Visit: Payer: Medicare Other | Admitting: Nurse Practitioner

## 2023-01-12 DIAGNOSIS — N368 Other specified disorders of urethra: Secondary | ICD-10-CM | POA: Diagnosis not present

## 2023-01-12 DIAGNOSIS — N950 Postmenopausal bleeding: Secondary | ICD-10-CM | POA: Diagnosis not present

## 2023-01-19 DIAGNOSIS — N950 Postmenopausal bleeding: Secondary | ICD-10-CM | POA: Diagnosis not present

## 2023-01-28 DIAGNOSIS — N8189 Other female genital prolapse: Secondary | ICD-10-CM | POA: Diagnosis not present

## 2023-01-28 DIAGNOSIS — K5900 Constipation, unspecified: Secondary | ICD-10-CM | POA: Diagnosis not present

## 2023-01-28 DIAGNOSIS — K644 Residual hemorrhoidal skin tags: Secondary | ICD-10-CM | POA: Diagnosis not present

## 2023-01-28 DIAGNOSIS — K625 Hemorrhage of anus and rectum: Secondary | ICD-10-CM | POA: Diagnosis not present

## 2023-02-17 DIAGNOSIS — Z299 Encounter for prophylactic measures, unspecified: Secondary | ICD-10-CM | POA: Diagnosis not present

## 2023-02-17 DIAGNOSIS — L237 Allergic contact dermatitis due to plants, except food: Secondary | ICD-10-CM | POA: Diagnosis not present

## 2023-02-17 DIAGNOSIS — I10 Essential (primary) hypertension: Secondary | ICD-10-CM | POA: Diagnosis not present

## 2023-02-17 DIAGNOSIS — F419 Anxiety disorder, unspecified: Secondary | ICD-10-CM | POA: Diagnosis not present

## 2023-02-19 ENCOUNTER — Ambulatory Visit: Payer: Medicare Other | Admitting: Nurse Practitioner

## 2023-03-12 ENCOUNTER — Encounter: Payer: Self-pay | Admitting: Internal Medicine

## 2023-03-12 ENCOUNTER — Encounter: Payer: Self-pay | Admitting: *Deleted

## 2023-03-12 ENCOUNTER — Other Ambulatory Visit: Payer: Self-pay | Admitting: *Deleted

## 2023-03-12 ENCOUNTER — Ambulatory Visit (INDEPENDENT_AMBULATORY_CARE_PROVIDER_SITE_OTHER): Payer: Medicare Other | Admitting: Internal Medicine

## 2023-03-12 VITALS — BP 125/79 | HR 69 | Temp 97.7°F | Ht 66.0 in | Wt 196.6 lb

## 2023-03-12 DIAGNOSIS — D126 Benign neoplasm of colon, unspecified: Secondary | ICD-10-CM

## 2023-03-12 MED ORDER — NA SULFATE-K SULFATE-MG SULF 17.5-3.13-1.6 GM/177ML PO SOLN: ORAL | 0 refills | Status: DC

## 2023-03-12 NOTE — H&P (View-Only) (Signed)
Primary Care Physician:  Ignatius Specking, MD Primary Gastroenterologist:  Dr.   Pre-Procedure History & Physical: HPI:  Alexa Wilson is a 77 y.o. female here to set up a surveillance colonoscopy history of multiple colonic adenomas removed over time.  Last exam 2019  - 2 adenomas.  Had sigmoid diverticulitis last year.  That put off examination.  She is dealing with the loss of her son to chronic kidney disease who passed away at the first of this year.  No bowel symptoms at this time.     Past Medical History:  Diagnosis Date   Agoraphobia with panic disorder 37   Brown recluse spider bite    Constipation    Diverticulitis    GERD (gastroesophageal reflux disease)    Hyperlipidemia    Lyme disease    Migraines    "don't have them since menopause" (09/23/2016)   Panic disorder    Shingles     Past Surgical History:  Procedure Laterality Date   APPENDECTOMY  1964   CHOLECYSTECTOMY OPEN  1966   COLONOSCOPY  2012   RMR: 1. External hemorrhoids, otherwise normal rectum. 2. Left-sided diverticula status post snare polypectomy left colon polyp. Remainder of the colonic mucosa and terminal ileum mucoa appeared normal.    COLONOSCOPY N/A 02/11/2017   Procedure: COLONOSCOPY;  Surgeon: Corbin Ade, MD; diverticulosis in the sigmoid and descending colon, 2 tubular adenomas, otherwise normal.  Repeat in 5 years.   DILATION AND CURETTAGE OF UTERUS     TONSILLECTOMY  1961    Prior to Admission medications   Medication Sig Start Date End Date Taking? Authorizing Provider  alprazolam Prudy Feeler) 2 MG tablet Take 1-2 mg by mouth every 8 (eight) hours. Take 1/2 tablet 8 am and 8 pm then take 1 tablet at 2 pm  Taking 4 mg daily.   Yes [provider]  cholecalciferol (VITAMIN D3) 25 MCG (1000 UNIT) tablet Take 1,000 Units by mouth daily.   Yes [provider]  docusate sodium (COLACE) 100 MG capsule Take 1 capsule (100 mg total) by mouth every 12 (twelve) hours. 09/08/22   Yes Idol, Raynelle Fanning, PA-C  levothyroxine (SYNTHROID) 25 MCG tablet Take 25 mcg by mouth daily. 12/11/19  Yes [provider]  metoprolol succinate (TOPROL-XL) 25 MG 24 hr tablet Take 0.5 tablets (12.5 mg total) by mouth daily. 09/12/21  Yes Lewayne Bunting, MD  ondansetron (ZOFRAN) 4 MG tablet Take 1 tablet (4 mg total) by mouth every 6 (six) hours. Patient taking differently: Take 4 mg by mouth every 8 (eight) hours as needed. 07/31/22  Yes Cristopher Peru, PA-C  rosuvastatin (CRESTOR) 10 MG tablet Take 20 mg by mouth at bedtime.  11/10/19  Yes [provider]  acetaminophen (TYLENOL) 500 MG tablet Take 500 mg by mouth every 6 (six) hours as needed for mild pain or moderate pain. Patient not taking: Reported on 03/09/2022    [provider]    Allergies as of 03/12/2023 - Review Complete 03/12/2023  Allergen Reaction Noted   Ciprofloxacin Nausea Only    Codeine Other (See Comments)    Metronidazole Nausea Only    Morphine and related Nausea And Vomiting 05/11/2013   Other Other (See Comments) 07/14/2012    Family History  Problem Relation Age of Onset   Diabetes Mother    Hypertension Mother    Heart attack Mother    Cancer Father        Melanoma  Diabetes Brother    Diabetes Son    Colon cancer Neg Hx     Social History   Socioeconomic History   Marital status: Widowed    Spouse name: Not on file   Number of children: 5   Years of education: Not on file   Highest education level: Not on file  Occupational History   Not on file  Tobacco Use   Smoking status: Never   Smokeless tobacco: Never  Vaping Use   Vaping Use: Never used  Substance and Sexual Activity   Alcohol use: No    Alcohol/week: 0.0 standard drinks of alcohol   Drug use: No   Sexual activity: Not Currently  Other Topics Concern   Not on file  Social History Narrative   Not on file   Social Determinants of Health   Financial Resource Strain: Not on file  Food Insecurity: Not  on file  Transportation Needs: Not on file  Physical Activity: Not on file  Stress: Not on file  Social Connections: Not on file  Intimate Partner Violence: Not on file    Review of Systems: See HPI, otherwise negative ROS  Physical Exam: BP 125/79 (BP Location: Right Arm, Patient Position: Sitting, Cuff Size: Large)   Pulse 69   Temp 97.7 F (36.5 C) (Oral)   Ht 5\' 6"  (1.676 m)   Wt 196 lb 9.6 oz (89.2 kg)   SpO2 99%   BMI 31.73 kg/m  General:   Alert,  Well-developed, well-nourished, pleasant and cooperative in NAD  Neck:  Supple; no masses or thyromegaly. No significant cervical adenopathy. Lungs:  Clear throughout to auscultation.   No wheezes, crackles, or rhonchi. No acute distress. Heart:  Regular rate and rhythm; no murmurs, clicks, rubs,  or gallops. Abdomen: Non-distended, normal bowel sounds.  Soft and nontender without appreciable mass or hepatosplenomegaly.   Impression/Plan: 77 year old lady with history multiple colonic adenomas removed over time;  here for consideration of surveillance.  She is still grieving with a the loss of her youngest son earlier this year to chronic kidney disease.  She is totally recovered from her bout of sigmoid diverticulitis last year.  She needs updated colonoscopy.   Recommendations:  I have  offered the patient a surveillance colonoscopy today. The risks, benefits, limitations, alternatives and imponderables have been reviewed with the patient. Questions have been answered. All parties are agreeable.          Notice: This dictation was prepared with Dragon dictation along with smaller phrase technology. Any transcriptional errors that result from this process are unintentional and may not be corrected upon review.

## 2023-03-12 NOTE — Patient Instructions (Signed)
Very nice to see you again today!  As discussed, we will plan to do a surveillance colonoscopy (history of colon polyps) ASA 2  Further recommendations to follow.

## 2023-03-12 NOTE — Progress Notes (Unsigned)
Primary Care Physician:  Ignatius Specking, MD Primary Gastroenterologist:  Dr.   Pre-Procedure History & Physical: HPI:  Alexa Wilson is a 76 y.o. female here to set up a surveillance colonoscopy history of multiple colonic adenomas removed over time.  Last exam 2019 2 adenomas.  Had sigmoid diverticulitis last year.  That put off examination.  She is dealing with the loss of her son to chronic kidney disease who passed away at the first of this year.  No bowel symptoms at this time.  For   Past Medical History:  Diagnosis Date   Agoraphobia with panic disorder 14   Brown recluse spider bite    Constipation    Diverticulitis    GERD (gastroesophageal reflux disease)    Hyperlipidemia    Lyme disease    Migraines    "don't have them since menopause" (09/23/2016)   Panic disorder    Shingles     Past Surgical History:  Procedure Laterality Date   APPENDECTOMY  1964   CHOLECYSTECTOMY OPEN  1966   COLONOSCOPY  2012   RMR: 1. External hemorrhoids, otherwise normal rectum. 2. Left-sided diverticula status post snare polypectomy left colon polyp. Remainder of the colonic mucosa and terminal ileum mucoa appeared normal.    COLONOSCOPY N/A 02/11/2017   Procedure: COLONOSCOPY;  Surgeon: Corbin Ade, MD; diverticulosis in the sigmoid and descending colon, 2 tubular adenomas, otherwise normal.  Repeat in 5 years.   DILATION AND CURETTAGE OF UTERUS     TONSILLECTOMY  1961    Prior to Admission medications   Medication Sig Start Date End Date Taking? Authorizing Provider  alprazolam Prudy Feeler) 2 MG tablet Take 1-2 mg by mouth every 8 (eight) hours. Take 1/2 tablet 8 am and 8 pm then take 1 tablet at 2 pm  Taking 4 mg daily.   Yes [provider]  cholecalciferol (VITAMIN D3) 25 MCG (1000 UNIT) tablet Take 1,000 Units by mouth daily.   Yes [provider]  docusate sodium (COLACE) 100 MG capsule Take 1 capsule (100 mg total) by mouth every 12 (twelve) hours. 09/08/22   Yes Idol, Raynelle Fanning, PA-C  levothyroxine (SYNTHROID) 25 MCG tablet Take 25 mcg by mouth daily. 12/11/19  Yes [provider]  metoprolol succinate (TOPROL-XL) 25 MG 24 hr tablet Take 0.5 tablets (12.5 mg total) by mouth daily. 09/12/21  Yes Lewayne Bunting, MD  ondansetron (ZOFRAN) 4 MG tablet Take 1 tablet (4 mg total) by mouth every 6 (six) hours. Patient taking differently: Take 4 mg by mouth every 8 (eight) hours as needed. 07/31/22  Yes Cristopher Peru, PA-C  rosuvastatin (CRESTOR) 10 MG tablet Take 20 mg by mouth at bedtime.  11/10/19  Yes [provider]  acetaminophen (TYLENOL) 500 MG tablet Take 500 mg by mouth every 6 (six) hours as needed for mild pain or moderate pain. Patient not taking: Reported on 03/09/2022    [provider]    Allergies as of 03/12/2023 - Review Complete 03/12/2023  Allergen Reaction Noted   Ciprofloxacin Nausea Only    Codeine Other (See Comments)    Metronidazole Nausea Only    Morphine and related Nausea And Vomiting 05/11/2013   Other Other (See Comments) 07/14/2012    Family History  Problem Relation Age of Onset   Diabetes Mother    Hypertension Mother    Heart attack Mother    Cancer Father        Melanoma    Diabetes  Brother    Diabetes Son    Colon cancer Neg Hx     Social History   Socioeconomic History   Marital status: Widowed    Spouse name: Not on file   Number of children: 5   Years of education: Not on file   Highest education level: Not on file  Occupational History   Not on file  Tobacco Use   Smoking status: Never   Smokeless tobacco: Never  Vaping Use   Vaping Use: Never used  Substance and Sexual Activity   Alcohol use: No    Alcohol/week: 0.0 standard drinks of alcohol   Drug use: No   Sexual activity: Not Currently  Other Topics Concern   Not on file  Social History Narrative   Not on file   Social Determinants of Health   Financial Resource Strain: Not on file  Food Insecurity: Not  on file  Transportation Needs: Not on file  Physical Activity: Not on file  Stress: Not on file  Social Connections: Not on file  Intimate Partner Violence: Not on file    Review of Systems: See HPI, otherwise negative ROS  Physical Exam: BP 125/79 (BP Location: Right Arm, Patient Position: Sitting, Cuff Size: Large)   Pulse 69   Temp 97.7 F (36.5 C) (Oral)   Ht 5\' 6"  (1.676 m)   Wt 196 lb 9.6 oz (89.2 kg)   SpO2 99%   BMI 31.73 kg/m  General:   Alert,  Well-developed, well-nourished, pleasant and cooperative in NAD  Neck:  Supple; no masses or thyromegaly. No significant cervical adenopathy. Lungs:  Clear throughout to auscultation.   No wheezes, crackles, or rhonchi. No acute distress. Heart:  Regular rate and rhythm; no murmurs, clicks, rubs,  or gallops. Abdomen: Non-distended, normal bowel sounds.  Soft and nontender without appreciable mass or hepatosplenomegaly.   Impression/Plan: 77 year old lady with history multiple colonic adenomas removed over time here for consideration of surveillance.  She is still grieving with a the loss of her youngest son earlier this year to chronic kidney disease.  She is totally recovered from her bout of sigmoid diverticulitis last year.  She needs updated colonoscopy.   Recommendations:  Of offered the patient a surveillance colonoscopy today. The risks, benefits, limitations, alternatives and imponderables have been reviewed with the patient. Questions have been answered. All parties are agreeable.          Notice: This dictation was prepared with Dragon dictation along with smaller phrase technology. Any transcriptional errors that result from this process are unintentional and may not be corrected upon review.

## 2023-03-15 ENCOUNTER — Encounter: Payer: Self-pay | Admitting: *Deleted

## 2023-04-06 ENCOUNTER — Encounter (HOSPITAL_COMMUNITY)
Admission: RE | Admit: 2023-04-06 | Discharge: 2023-04-06 | Disposition: A | Discharge status: Home / Self Care | Payer: Medicare Other | Source: Ambulatory Visit | Attending: Internal Medicine | Admitting: Internal Medicine

## 2023-04-06 NOTE — Progress Notes (Signed)
Called pt regarding PAT phone call. No answer. Left voicemail

## 2023-04-07 ENCOUNTER — Encounter (HOSPITAL_COMMUNITY): Payer: Self-pay

## 2023-04-09 ENCOUNTER — Encounter (HOSPITAL_COMMUNITY): Payer: Self-pay | Admitting: Internal Medicine

## 2023-04-09 ENCOUNTER — Ambulatory Visit (HOSPITAL_BASED_OUTPATIENT_CLINIC_OR_DEPARTMENT_OTHER): Payer: Medicare Other | Admitting: Certified Registered"

## 2023-04-09 ENCOUNTER — Ambulatory Visit (HOSPITAL_COMMUNITY)
Admission: RE | Admit: 2023-04-09 | Discharge: 2023-04-09 | Disposition: A | Discharge status: Home / Self Care | Payer: Medicare Other | Attending: Internal Medicine | Admitting: Internal Medicine

## 2023-04-09 ENCOUNTER — Ambulatory Visit (HOSPITAL_COMMUNITY): Payer: Medicare Other | Admitting: Certified Registered"

## 2023-04-09 ENCOUNTER — Encounter (HOSPITAL_COMMUNITY)
Admission: RE | Disposition: A | Discharge status: Home / Self Care | Payer: Self-pay | Source: Home / Self Care | Attending: Internal Medicine

## 2023-04-09 ENCOUNTER — Other Ambulatory Visit: Payer: Self-pay

## 2023-04-09 DIAGNOSIS — K5730 Diverticulosis of large intestine without perforation or abscess without bleeding: Secondary | ICD-10-CM | POA: Insufficient documentation

## 2023-04-09 DIAGNOSIS — K635 Polyp of colon: Secondary | ICD-10-CM | POA: Diagnosis not present

## 2023-04-09 DIAGNOSIS — D126 Benign neoplasm of colon, unspecified: Secondary | ICD-10-CM | POA: Diagnosis not present

## 2023-04-09 DIAGNOSIS — D123 Benign neoplasm of transverse colon: Secondary | ICD-10-CM | POA: Diagnosis not present

## 2023-04-09 DIAGNOSIS — G43909 Migraine, unspecified, not intractable, without status migrainosus: Secondary | ICD-10-CM | POA: Diagnosis not present

## 2023-04-09 DIAGNOSIS — K219 Gastro-esophageal reflux disease without esophagitis: Secondary | ICD-10-CM | POA: Diagnosis not present

## 2023-04-09 DIAGNOSIS — Z1211 Encounter for screening for malignant neoplasm of colon: Secondary | ICD-10-CM | POA: Insufficient documentation

## 2023-04-09 DIAGNOSIS — F419 Anxiety disorder, unspecified: Secondary | ICD-10-CM | POA: Diagnosis not present

## 2023-04-09 DIAGNOSIS — Z86010 Personal history of colonic polyps: Secondary | ICD-10-CM

## 2023-04-09 HISTORY — PX: COLONOSCOPY WITH PROPOFOL: SHX5780

## 2023-04-09 HISTORY — PX: POLYPECTOMY: SHX5525

## 2023-04-09 SURGERY — COLONOSCOPY WITH PROPOFOL
Anesthesia: General

## 2023-04-09 MED ORDER — EPHEDRINE SULFATE (PRESSORS) 50 MG/ML IJ SOLN
INTRAMUSCULAR | Status: DC | PRN
  Administered 2023-04-09 (×2): 10 mg via INTRAVENOUS

## 2023-04-09 MED ORDER — EPHEDRINE SULFATE-NACL 50-0.9 MG/10ML-% IV SOSY
PREFILLED_SYRINGE | INTRAVENOUS | Status: DC | PRN
  Administered 2023-04-09: 10 mg via INTRAVENOUS

## 2023-04-09 MED ORDER — LACTATED RINGERS IV SOLN: INTRAVENOUS | Status: DC

## 2023-04-09 MED ORDER — PROPOFOL 500 MG/50ML IV EMUL
INTRAVENOUS | Status: DC | PRN
  Administered 2023-04-09: 125 ug/kg/min via INTRAVENOUS

## 2023-04-09 MED ORDER — PROPOFOL 10 MG/ML IV BOLUS
INTRAVENOUS | Status: DC | PRN
  Administered 2023-04-09: 50 mg via INTRAVENOUS

## 2023-04-09 MED ORDER — LIDOCAINE HCL (CARDIAC) PF 100 MG/5ML IV SOSY
PREFILLED_SYRINGE | INTRAVENOUS | Status: DC | PRN
  Administered 2023-04-09: 60 mg via INTRAVENOUS

## 2023-04-09 MED ORDER — EPHEDRINE 5 MG/ML INJ
INTRAVENOUS | Status: AC
  Filled 2023-04-09: qty 5

## 2023-04-09 NOTE — Anesthesia Postprocedure Evaluation (Signed)
Anesthesia Post Note  Patient: Alexa Wilson  Procedure(s) Performed: COLONOSCOPY WITH PROPOFOL POLYPECTOMY  Patient location during evaluation: Phase II Anesthesia Type: General Level of consciousness: awake Pain management: pain level controlled Vital Signs Assessment: post-procedure vital signs reviewed and stable Respiratory status: spontaneous breathing and respiratory function stable Cardiovascular status: blood pressure returned to baseline and stable Postop Assessment: no headache and no apparent nausea or vomiting Anesthetic complications: no Comments: Late entry   No notable events documented.   Last Vitals:  Vitals:   04/09/23 1154 04/09/23 1158  BP: (!) 101/39 (!) 115/52  Pulse:    Resp:    Temp:    SpO2: 98%     Last Pain:  Vitals:   04/09/23 1154  TempSrc:   PainSc: 0-No pain                 Windell Norfolk

## 2023-04-09 NOTE — Discharge Instructions (Signed)
  Colonoscopy Discharge Instructions  Read the instructions outlined below and refer to this sheet in the next few weeks. These discharge instructions provide you with general information on caring for yourself after you leave the hospital. Your doctor may also give you specific instructions. While your treatment has been planned according to the most current medical practices available, unavoidable complications occasionally occur. If you have any problems or questions after discharge, call Dr. Jena Gauss at [phone]. ACTIVITY You may resume your regular activity, but move at a slower pace for the next 24 hours.  Take frequent rest periods for the next 24 hours.  Walking will help get rid of the air and reduce the bloated feeling in your belly (abdomen).  No driving for 24 hours (because of the medicine (anesthesia) used during the test).   Do not sign any important legal documents or operate any machinery for 24 hours (because of the anesthesia used during the test).  NUTRITION Drink plenty of fluids.  You may resume your normal diet as instructed by your doctor.  Begin with a light meal and progress to your normal diet. Heavy or fried foods are harder to digest and may make you feel sick to your stomach (nauseated).  Avoid alcoholic beverages for 24 hours or as instructed.  MEDICATIONS You may resume your normal medications unless your doctor tells you otherwise.  WHAT YOU CAN EXPECT TODAY Some feelings of bloating in the abdomen.  Passage of more gas than usual.  Spotting of blood in your stool or on the toilet paper.  IF YOU HAD POLYPS REMOVED DURING THE COLONOSCOPY: No aspirin products for 7 days or as instructed.  No alcohol for 7 days or as instructed.  Eat a soft diet for the next 24 hours.  FINDING OUT THE RESULTS OF YOUR TEST Not all test results are available during your visit. If your test results are not back during the visit, make an appointment with your caregiver to find out the  results. Do not assume everything is normal if you have not heard from your caregiver or the medical facility. It is important for you to follow up on all of your test results.  SEEK IMMEDIATE MEDICAL ATTENTION IF: You have more than a spotting of blood in your stool.  Your belly is swollen (abdominal distention).  You are nauseated or vomiting.  You have a temperature over 101.  You have abdominal pain or discomfort that is severe or gets worse throughout the day.      3 small polyps removed from your colon today  Colon polyp and diverticulosis information provided   further recommendations to follow pending review of pathology report   at patient request, called Makenna Macaluso at [phone] contact.

## 2023-04-09 NOTE — Transfer of Care (Signed)
Immediate Anesthesia Transfer of Care Note  Patient: Alexa Wilson  Procedure(s) Performed: COLONOSCOPY WITH PROPOFOL POLYPECTOMY  Patient Location: PACU  Anesthesia Type:General  Level of Consciousness: awake and patient cooperative  Airway & Oxygen Therapy: Patient Spontanous Breathing and Patient connected to face mask oxygen  Post-op Assessment: Report given to RN and Post -op Vital signs reviewed and stable  Post vital signs: Reviewed and stable  Last Vitals:  Vitals Value Taken Time  BP    Temp 36.6 C 04/09/23 1149  Pulse 66 04/09/23 1149  Resp 15 04/09/23 1149  SpO2 100 % 04/09/23 1149    Last Pain:  Vitals:   04/09/23 1149  TempSrc: Axillary  PainSc:       Patients Stated Pain Goal: 8 (04/09/23 1046)  Complications: No notable events documented. Nasal cannula in place.

## 2023-04-09 NOTE — Interval H&P Note (Signed)
History and Physical Interval Note:  04/09/2023 11:11 AM  Alexa Wilson  has presented today for surgery, with the diagnosis of history of polyps.  The various methods of treatment have been discussed with the patient and family. After consideration of risks, benefits and other options for treatment, the patient has consented to  Procedure(s) with comments: COLONOSCOPY WITH PROPOFOL (N/A) - 1:00 pm, asa 2 as a surgical intervention.  The patient's history has been reviewed, patient examined, no change in status, stable for surgery.  I have reviewed the patient's chart and labs.  Questions were answered to the patient's satisfaction.     Alexa Wilson   no change.  Surveillance colonoscopy today per plan. The risks, benefits, limitations, alternatives and imponderables have been reviewed with the patient. Questions have been answered. All parties are agreeable.

## 2023-04-09 NOTE — Op Note (Signed)
[facility] Patient Name: Alexa Wilson Procedure Date: 04/09/2023 11:09 AM MRN: [id number] Date of Birth: 1946/10/16 Attending MD: Gennette Pac , MD, [id number] CSN: [id number] Age: 77 Admit Type: Outpatient Procedure:                Colonoscopy Indications:              High risk colon cancer surveillance: Personal                            history of colonic polyps Providers:                Gennette Pac, MD, Angelica Ran, Zena Amos Referring MD:              Medicines:                Propofol per Anesthesia Complications:            No immediate complications. Estimated Blood Loss:     Estimated blood loss was minimal. Procedure:                Pre-Anesthesia Assessment:                           - Prior to the procedure, a History and Physical                            was performed, and patient medications and                            allergies were reviewed. The patient's tolerance of                            previous anesthesia was also reviewed. The risks                            and benefits of the procedure and the sedation                            options and risks were discussed with the patient.                            All questions were answered, and informed consent                            was obtained. Prior Anticoagulants: The patient has                            taken no anticoagulant or antiplatelet agents. ASA                            Grade Assessment: III - A patient with severe  systemic disease. After reviewing the risks and                            benefits, the patient was deemed in satisfactory                            condition to undergo the procedure.                           After obtaining informed consent, the colonoscope                            was passed under direct vision. Throughout the                            procedure, the patient's  blood pressure, pulse, and                            oxygen saturations were monitored continuously. The                            [id number]) scope was introduced through                            the anus and advanced to the the cecum, identified                            by appendiceal orifice and ileocecal valve. The                            colonoscopy was performed without difficulty. The                            patient tolerated the procedure well. The quality                            of the bowel preparation was adequate. The                            ileocecal valve, appendiceal orifice, and rectum                            were photographed. The entire colon was well                            visualized. Scope In: 11:25:25 AM Scope Out: 11:43:16 AM Scope Withdrawal Time: 0 hours 10 minutes 57 seconds  Total Procedure Duration: 0 hours 17 minutes 51 seconds  Findings:      The perianal and digital rectal examinations were normal.      Scattered medium-mouthed diverticula were found in the sigmoid colon and       descending colon.      Three semi-pedunculated polyps were found in the splenic flexure. The       polyps were 4 to 6 mm in size. These  polyps were removed with a cold       snare. Resection and retrieval were complete. Estimated blood loss was       minimal.      The exam was otherwise without abnormality. Rectal mucosa seen well on       face. Rectal vault too small to retroflex. Impression:               - Diverticulosis in the sigmoid colon and in the                            descending colon.                           - Three 4 to 6 mm polyps at the splenic flexure,                            removed with a cold snare. Resected and retrieved.                           - The examination was otherwise normal. Moderate Sedation:      Moderate (conscious) sedation was personally administered by an       anesthesia professional. The following  parameters were monitored: oxygen       saturation, heart rate, blood pressure, respiratory rate, EKG, adequacy       of pulmonary ventilation, and response to care. Recommendation:           - Patient has a contact number available for                            emergencies. The signs and symptoms of potential                            delayed complications were discussed with the                            patient. Return to normal activities tomorrow.                            Written discharge instructions were provided to the                            patient.                           - Resume previous diet.                           - Repeat colonoscopy date to be determined after                            pending pathology results are reviewed for                            surveillance.                           -  Return to GI office (date not yet determined). Procedure Code(s):        --- Professional ---                           [phone], Colonoscopy, flexible; with removal of                            tumor(s), polyp(s), or other lesion(s) by snare                            technique Diagnosis Code(s):        --- Professional ---                           Z86.010, Personal history of colonic polyps                           D12.3, Benign neoplasm of transverse colon (hepatic                            flexure or splenic flexure)                           K57.30, Diverticulosis of large intestine without                            perforation or abscess without bleeding CPT copyright 2022 American Medical Association. All rights reserved. The codes documented in this report are preliminary and upon coder review may  be revised to meet current compliance requirements. Gerrit Friends. Bently Wyss, MD Gennette Pac, MD 04/09/2023 11:49:54 AM This report has been signed electronically. Number of Addenda: 0

## 2023-04-09 NOTE — Anesthesia Preprocedure Evaluation (Signed)
Anesthesia Evaluation  Patient identified by MRN, date of birth, ID band Patient awake    Reviewed: Allergy & Precautions, H&P , NPO status , Patient's Chart, lab work & pertinent test results, reviewed documented beta blocker date and time   Airway Mallampati: II  TM Distance: >3 FB Neck ROM: full    Dental no notable dental hx.    Pulmonary neg pulmonary ROS   Pulmonary exam normal breath sounds clear to auscultation       Cardiovascular Exercise Tolerance: Good negative cardio ROS  Rhythm:regular Rate:Normal     Neuro/Psych  Headaches PSYCHIATRIC DISORDERS Anxiety     negative neurological ROS  negative psych ROS   GI/Hepatic negative GI ROS, Neg liver ROS,GERD  ,,  Endo/Other  negative endocrine ROS    Renal/GU negative Renal ROS  negative genitourinary   Musculoskeletal   Abdominal   Peds  Hematology negative hematology ROS (+)   Anesthesia Other Findings   Reproductive/Obstetrics negative OB ROS                             Anesthesia Physical Anesthesia Plan  ASA: 3  Anesthesia Plan: General   Post-op Pain Management:    Induction:   PONV Risk Score and Plan: Propofol infusion  Airway Management Planned:   Additional Equipment:   Intra-op Plan:   Post-operative Plan:   Informed Consent: I have reviewed the patients History and Physical, chart, labs and discussed the procedure including the risks, benefits and alternatives for the proposed anesthesia with the patient or authorized representative who has indicated his/her understanding and acceptance.     Dental Advisory Given  Plan Discussed with: CRNA  Anesthesia Plan Comments:        Anesthesia Quick Evaluation

## 2023-04-12 ENCOUNTER — Encounter: Payer: Self-pay | Admitting: Internal Medicine

## 2023-04-12 LAB — SURGICAL PATHOLOGY

## 2023-04-16 ENCOUNTER — Encounter (HOSPITAL_COMMUNITY): Payer: Self-pay | Admitting: Internal Medicine

## 2023-06-09 DIAGNOSIS — M545 Low back pain, unspecified: Secondary | ICD-10-CM | POA: Diagnosis not present

## 2023-06-09 DIAGNOSIS — I10 Essential (primary) hypertension: Secondary | ICD-10-CM | POA: Diagnosis not present

## 2023-06-09 DIAGNOSIS — Z299 Encounter for prophylactic measures, unspecified: Secondary | ICD-10-CM | POA: Diagnosis not present

## 2023-07-06 IMAGING — CT CT ABD-PELV W/ CM
2 of 5 series · 16 of 46 positions shown, 18 images · IV contrast (Omnipaque or Isovue)
Comparison: 06/01/2020

CLINICAL DATA: Abdominal pain and bloating for 2 days, history of
diverticulitis

EXAM:
CT ABDOMEN AND PELVIS WITH CONTRAST
TECHNIQUE: Multidetector CT imaging of the abdomen and pelvis was performed
using the standard protocol following bolus administration of
intravenous contrast.
CONTRAST:  80mL OMNIPAQUE IOHEXOL 350 MG/ML SOLN

[Series 2: axial st · axial · 0.70mm/px · z∈[+830,+1225]mm · 13 of 91 slices shown, 15 images]
[im 6/91  soft-tissue]
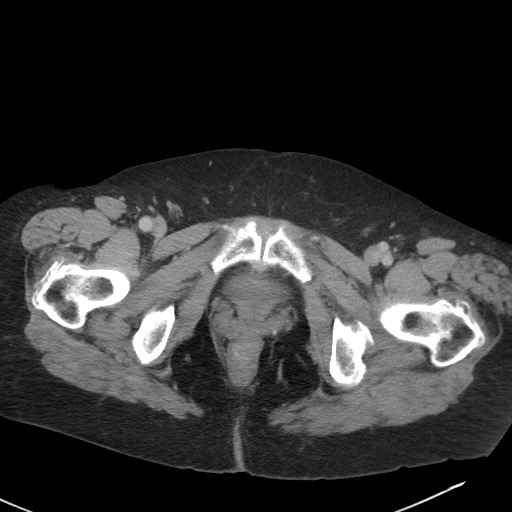
[im 6/91  bone]
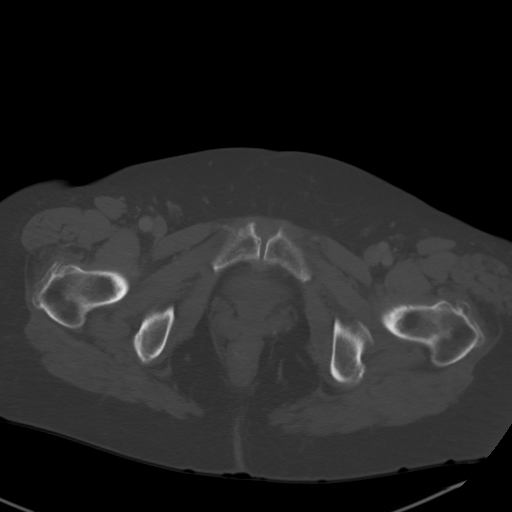
[im 11/91  soft-tissue]
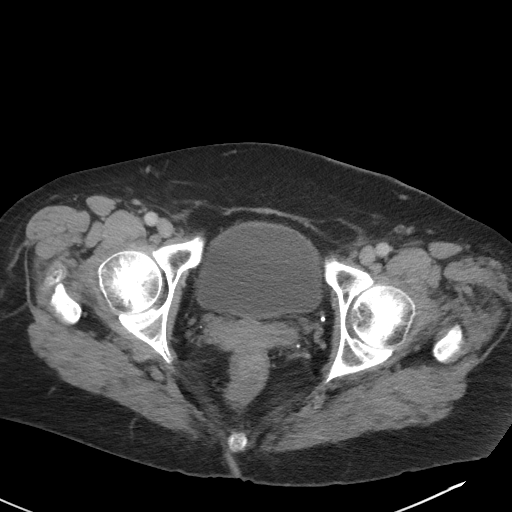
[im 22/91  soft-tissue]
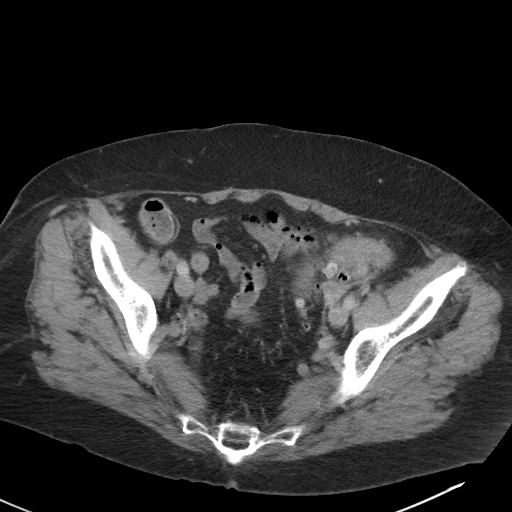
[im 27/91  soft-tissue]
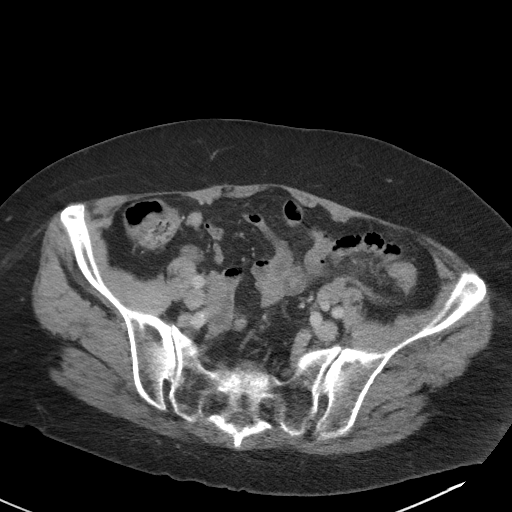
[im 32/91  soft-tissue]
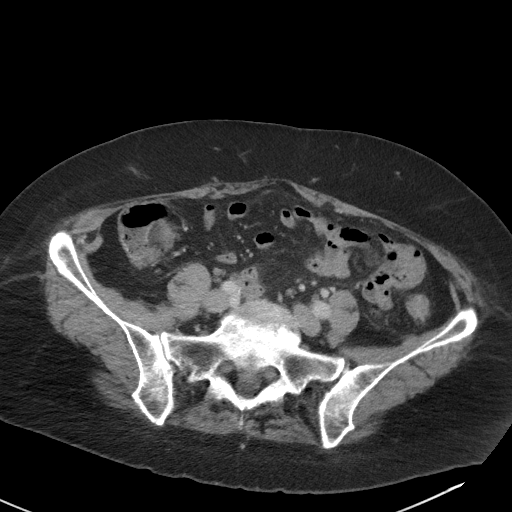
[im 38/91  soft-tissue]
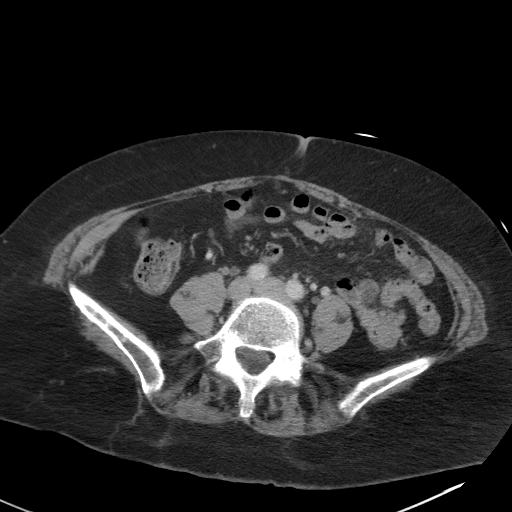
[im 48/91  soft-tissue]
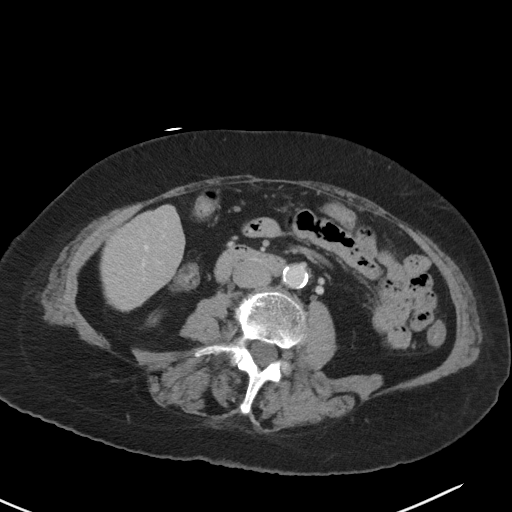
[im 53/91  soft-tissue]
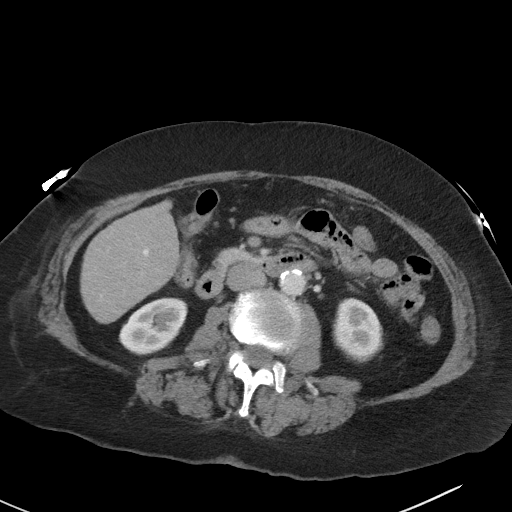
[im 59/91  soft-tissue]
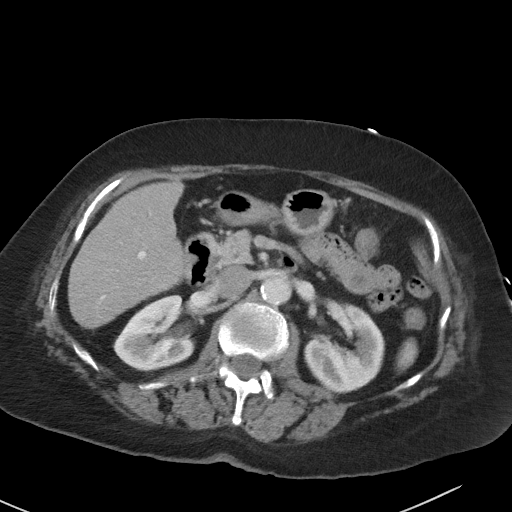
[im 59/91  bone]
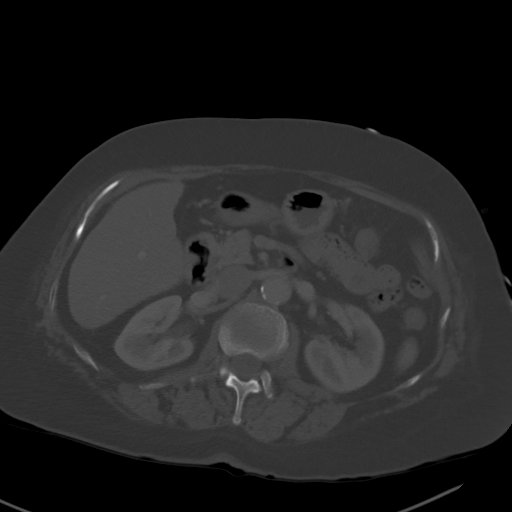
[im 64/91  soft-tissue]
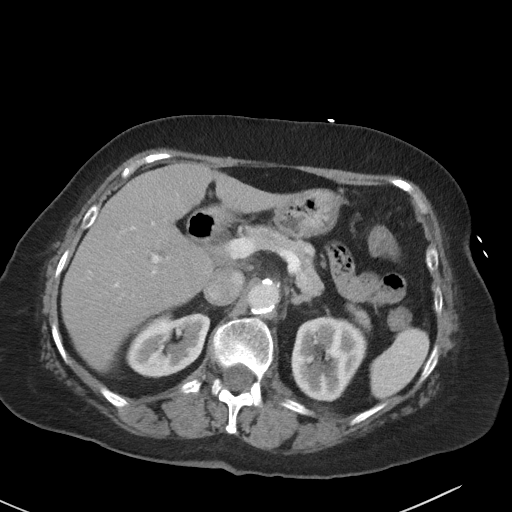
[im 69/91  soft-tissue]
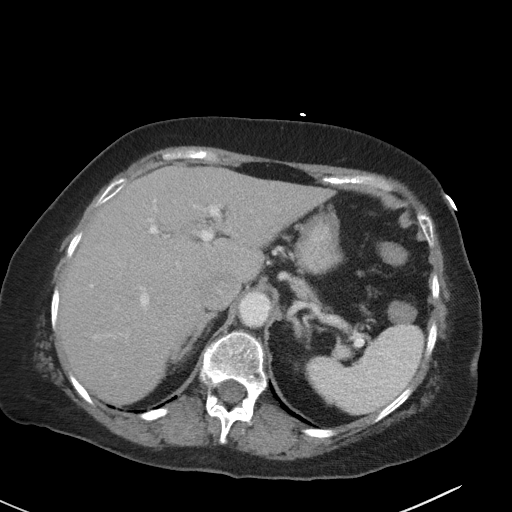
[im 80/91  soft-tissue]
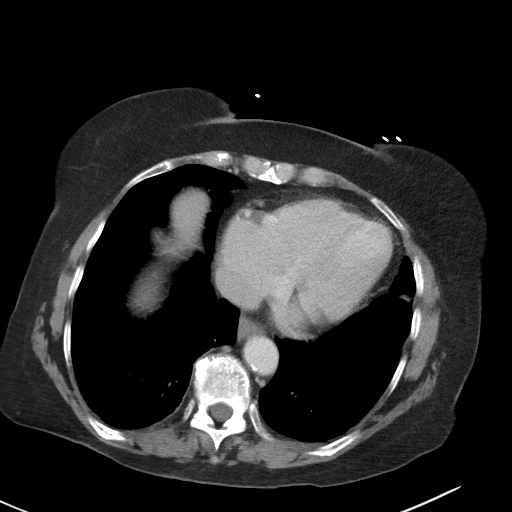
[im 85/91  soft-tissue]
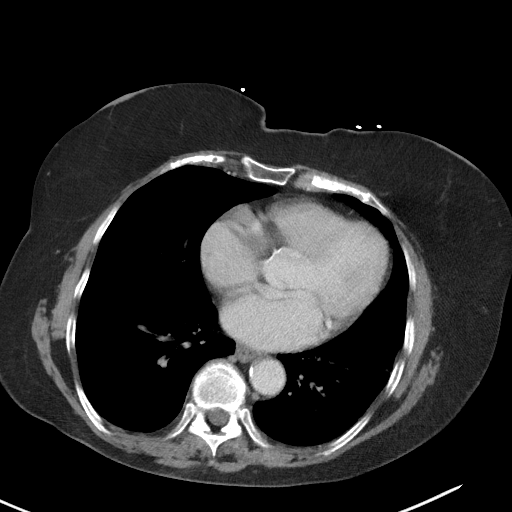

[Series 5: coronal st · coronal · 0.68mm/px · 3 of 70 slices shown]
[im 24/70  soft-tissue]
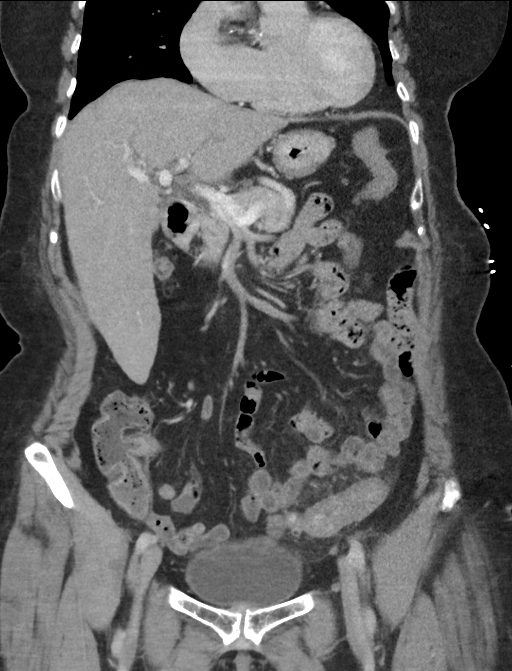
[im 31/70  soft-tissue]
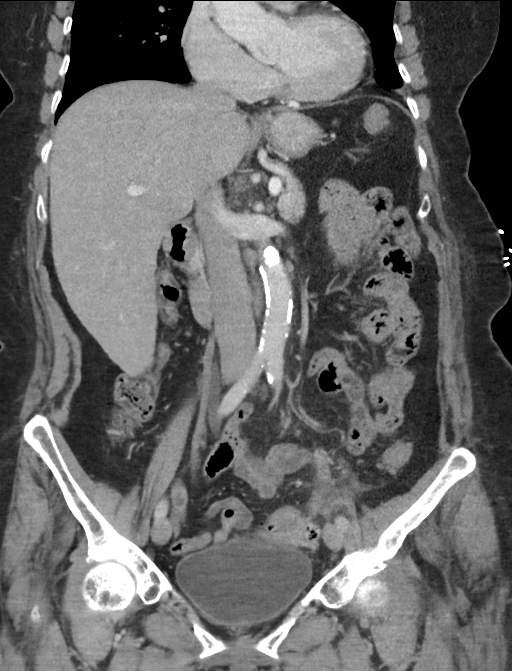
[im 39/70  soft-tissue]
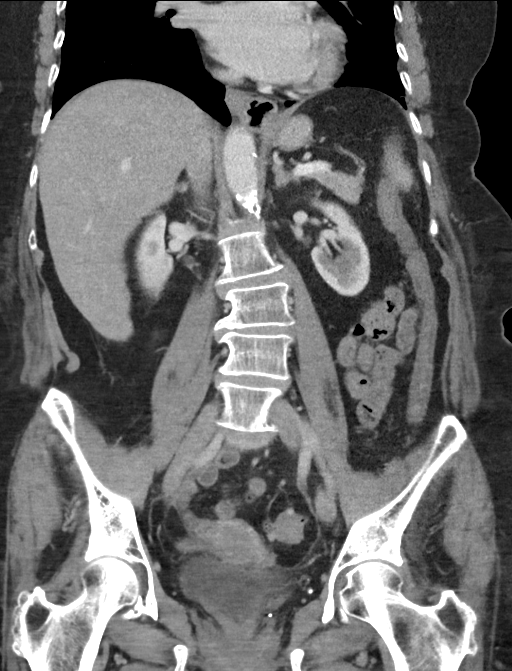

[16 of 46 positions shown; findings below may reference images not displayed]

FINDINGS: Lower chest: Mild scarring is noted in the bases bilaterally.

Hepatobiliary: No focal liver abnormality is seen. Status post
cholecystectomy. No biliary dilatation.

Pancreas: Unremarkable. No pancreatic ductal dilatation or
surrounding inflammatory changes.

Spleen: Normal in size without focal abnormality.

Adrenals/Urinary Tract: Adrenal glands are within normal limits.
Kidneys demonstrate a normal enhancement pattern bilaterally. No
renal calculi or urinary tract obstructive changes are seen. Normal
excretion is noted on delayed images. The ureters are within normal
limits. Bladder is partially distended.

Stomach/Bowel: Colon shows diverticular change with very mild
sigmoid diverticulitis. No perforation or abscess formation is
noted. Remainder of the colon appears within normal limits. The
appendix has been surgically removed. Small bowel and stomach appear
within normal limits.

Vascular/Lymphatic: Aortic atherosclerosis. No enlarged abdominal or
pelvic lymph nodes.

Reproductive: Uterus and bilateral adnexa are unremarkable.

Other: No abdominal wall hernia or abnormality. No abdominopelvic
ascites.

Musculoskeletal: No acute or significant osseous findings.
IMPRESSION: Changes consistent with mild diverticulitis. No abscess or
perforation is noted.

No other focal abnormality is noted.

## 2023-07-27 ENCOUNTER — Encounter: Payer: Self-pay | Admitting: Gastroenterology

## 2023-07-27 ENCOUNTER — Ambulatory Visit (INDEPENDENT_AMBULATORY_CARE_PROVIDER_SITE_OTHER): Payer: Medicare Other | Admitting: Gastroenterology

## 2023-07-27 VITALS — BP 136/80 | HR 62 | Temp 97.3°F | Ht 66.0 in | Wt 203.9 lb

## 2023-07-27 DIAGNOSIS — K641 Second degree hemorrhoids: Secondary | ICD-10-CM | POA: Diagnosis not present

## 2023-07-27 DIAGNOSIS — K625 Hemorrhage of anus and rectum: Secondary | ICD-10-CM | POA: Diagnosis not present

## 2023-07-27 DIAGNOSIS — Z86010 Personal history of colonic polyps: Secondary | ICD-10-CM | POA: Diagnosis not present

## 2023-07-27 NOTE — Progress Notes (Unsigned)
GI Office Note    Referring Provider: Ignatius Specking, MD Primary Care Physician:  Ignatius Specking, MD Primary Gastroenterologist: Gerrit Friends.Rourk, MD  Date:  07/27/2023  ID:  Alexa Wilson, DOB 25-Aug-1946, MRN [id number]   Chief Complaint   Chief Complaint  Patient presents with   Rectal Bleeding    Patient here today due to issues with rectal bleed. Patient says she is seeing bright red blood with clots.   History of Present Illness  Alexa Wilson is a 77 y.o. female with a history of diverticulitis, constipation, Lyme disease, HLD, migraines, and GERD presenting today with complaint of rectal bleeding. ***  Colonoscopy in 2019: -2 adenomas removed  Last office visit 03/12/23. ***Presented to and set up a surveillance colonoscopy given history of multiple colonic polyps removed in the past.  Reportedly dealing with the loss of her son due to CKD who passed to the beginning of the year.  Denied any bowel symptoms.  Scheduled for colonoscopy.  Colonoscopy 04/09/23: - Diverticulosis in the sigmoid colon and in the descending colon.  - Three 4 to 6 mm polyps at the splenic flexure - The examination was otherwise normal.  Today: Rectal bleeding - went to urinate around 12-1pm and when she wiped there was bright red on the toilet issue with some strings of clot material. Has some intermittent hemorrhoids that she has and has had bleeding with Bm's in the past.   Has had some left lower abdominal pain intermittently and has had some issues ever since that has back pain as well.  Had to do lots of heavy lifting and bleeding from vaginal area during that time and GYN did [place] and biopsies and everything was negative.   Current Outpatient Medications  Medication Sig Dispense Refill   acetaminophen (TYLENOL) 500 MG tablet Take 500 mg by mouth every 6 (six) hours as needed for mild pain or moderate pain.     alprazolam (XANAX) 2 MG tablet Take 1 mg by mouth 3 (three) times daily.      levothyroxine (SYNTHROID) 25 MCG tablet Take 25 mcg by mouth daily before breakfast.     metoprolol succinate (TOPROL-XL) 25 MG 24 hr tablet Take 0.5 tablets (12.5 mg total) by mouth daily. 45 tablet 3   ondansetron (ZOFRAN) 4 MG tablet Take 1 tablet (4 mg total) by mouth every 6 (six) hours. (Patient taking differently: Take 4 mg by mouth every 8 (eight) hours as needed.) 12 tablet 0   OVER THE COUNTER MEDICATION Vit D 3 takes one every 2-3 days     polyethylene glycol (MIRALAX / GLYCOLAX) 17 g packet Take 17 g by mouth daily as needed for mild constipation or moderate constipation.     rosuvastatin (CRESTOR) 10 MG tablet Take 10 mg by mouth daily at 6 PM.     No current facility-administered medications for this visit.    Past Medical History:  Diagnosis Date   Agoraphobia with panic disorder 74   Brown recluse spider bite    Constipation    Diverticulitis    GERD (gastroesophageal reflux disease)    Hyperlipidemia    Lyme disease    Migraines    "don't have them since menopause" (09/23/2016)   Panic disorder    Shingles     Past Surgical History:  Procedure Laterality Date   APPENDECTOMY  1964   CHOLECYSTECTOMY OPEN  1966   COLONOSCOPY  2012   RMR: 1. External hemorrhoids, otherwise normal rectum. 2.  Left-sided diverticula status post snare polypectomy left colon polyp. Remainder of the colonic mucosa and terminal ileum mucoa appeared normal.    COLONOSCOPY N/A 02/11/2017   Procedure: COLONOSCOPY;  Surgeon: Corbin Ade, MD; diverticulosis in the sigmoid and descending colon, 2 tubular adenomas, otherwise normal.  Repeat in 5 years.   COLONOSCOPY WITH PROPOFOL N/A 04/09/2023   Procedure: COLONOSCOPY WITH PROPOFOL;  Surgeon: Corbin Ade, MD;  Location: [facility];  Service: Endoscopy;  Laterality: N/A;  1:00 pm, asa 2   DILATION AND CURETTAGE OF UTERUS     POLYPECTOMY  04/09/2023   Procedure: POLYPECTOMY;  Surgeon: Corbin Ade, MD;  Location: [facility];  Service:  Endoscopy;;   TONSILLECTOMY  1961    Family History  Problem Relation Age of Onset   Diabetes Mother    Hypertension Mother    Heart attack Mother    Cancer Father        Melanoma    Diabetes Brother    Diabetes Son    Colon cancer Neg Hx     Allergies as of 07/27/2023 - Review Complete 07/27/2023  Allergen Reaction Noted   Ciprofloxacin Nausea Only    Codeine Other (See Comments)    Metronidazole Nausea Only    Morphine and codeine Nausea And Vomiting 05/11/2013   Other Other (See Comments) 07/14/2012    Social History   Socioeconomic History   Marital status: Widowed    Spouse name: Not on file   Number of children: 5   Years of education: Not on file   Highest education level: Not on file  Occupational History   Not on file  Tobacco Use   Smoking status: Never   Smokeless tobacco: Never  Vaping Use   Vaping status: Never Used  Substance and Sexual Activity   Alcohol use: No    Alcohol/week: 0.0 standard drinks of alcohol   Drug use: No   Sexual activity: Not Currently  Other Topics Concern   Not on file  Social History Narrative   Not on file   Social Determinants of Health   Financial Resource Strain: Low Risk  (06/25/2020)   Received from [facility], [facility]   Overall Financial Resource Strain (CARDIA)    Difficulty of Paying Living Expenses: Not hard at all  Food Insecurity: No Food Insecurity (05/15/2021)   Received from [facility], [facility]   Hunger Vital Sign    Worried About Running Out of Food in the Last Year: Never true    Ran Out of Food in the Last Year: Never true  Transportation Needs: No Transportation Needs (06/25/2020)   Received from [facility], [facility]   PRAPARE - Transportation    Lack of Transportation (Medical): No    Lack of Transportation (Non-Medical): No  Physical Activity: Inactive (06/25/2020)   Received from [facility], [facility]   Exercise Vital Sign    Days of Exercise per Week: 0 days     Minutes of Exercise per Session: 0 min  Stress: Stress Concern Present (06/25/2020)   Received from [facility], [facility] - Occupational Stress Questionnaire    Feeling of Stress : Very much  Social Connections: Unknown (03/17/2022)   Received from [facility], [facility]   Social Network    Social Network: Not on file     Review of Systems   Gen: Denies fever, chills, anorexia. Denies fatigue, weakness, weight loss.  CV:  Denies chest pain, palpitations, syncope, peripheral edema, and claudication. Resp: Denies dyspnea at rest, cough, wheezing, coughing up blood, and pleurisy. GI: See HPI Derm: Denies rash, itching, dry skin Psych: Denies depression, anxiety, memory loss, confusion. No homicidal or suicidal ideation.  Heme: Denies bruising, bleeding, and enlarged lymph nodes.   Physical Exam   BP 136/80 (BP Location: Left Arm, Patient Position: Sitting, Cuff Size: Normal)   Pulse 62   Temp (!) 97.3 F (36.3 C) (Temporal)   Ht 5\' 6"  (1.676 m)   Wt 203 lb 14.4 oz (92.5 kg)   BMI 32.91 kg/m   General:   Alert and oriented. No distress noted. Pleasant and cooperative.  Head:  Normocephalic and atraumatic. Eyes:  Conjuctiva clear without scleral icterus. Mouth:  Oral mucosa pink and moist. Good dentition. No lesions. Lungs:  Clear to auscultation bilaterally. No wheezes, rales, or rhonchi. No distress.  Heart:  S1, S2 present without murmurs appreciated.  Abdomen:  +BS, soft, non-tender and non-distended. No rebound or guarding. No HSM or masses noted. Rectal: Good rectal tone.  Obvious red-tinged mucus/blood and small amount externally.  Hemorrhoid tissue noted to the 1:00 and 2 o'clock position.  Reduced easily on exam and having some fairly quick prolapse.  No evidence of fissure or pain on exam.  Vaginal prolapse visualized.  Msk:  Symmetrical without gross deformities. Normal posture. Extremities:  Without  edema. Neurologic:  Alert and  oriented x4 Psych:  Alert and cooperative. Normal mood and affect.   Assessment  Alexa Wilson is a 76 y.o. female with a history of *** presenting today with   Rectal bleeding, hemorrhoids:   History of colon polyps:   PLAN   *** Preparation H 2-3 times daily for 5 days then as needed Avoid straining Use miralax daily for next several days.  Follow up as needed.      Brooke Bonito, MSN, FNP-BC, AGACNP-BC [facility]

## 2023-07-27 NOTE — Patient Instructions (Signed)
Use Preparation H 2-3 times daily internally and externally for the next 5 days and then use as needed.  Avoid straining and spending long periods of time on the commode.  Try to limit this to 2-3 minutes at a time.  If you are having some hard stools please use MiraLAX daily for the next several days.  Follow-up as needed.  If you began having any worsening abdominal pain or worsening rectal bleeding please call the office back.  It was a pleasure to see you today. I want to create trusting relationships with patients. If you receive a survey regarding your visit,  I greatly appreciate you taking time to fill this out on paper or through your MyChart. I value your feedback.  Brooke Bonito, MSN, FNP-BC, AGACNP-BC [facility]

## 2023-08-06 DIAGNOSIS — I700 Atherosclerosis of aorta: Secondary | ICD-10-CM | POA: Diagnosis not present

## 2023-08-06 DIAGNOSIS — I10 Essential (primary) hypertension: Secondary | ICD-10-CM | POA: Diagnosis not present

## 2023-08-06 DIAGNOSIS — R52 Pain, unspecified: Secondary | ICD-10-CM | POA: Diagnosis not present

## 2023-08-06 DIAGNOSIS — Z299 Encounter for prophylactic measures, unspecified: Secondary | ICD-10-CM | POA: Diagnosis not present

## 2023-08-06 DIAGNOSIS — R110 Nausea: Secondary | ICD-10-CM | POA: Diagnosis not present

## 2023-08-06 DIAGNOSIS — Z2821 Immunization not carried out because of patient refusal: Secondary | ICD-10-CM | POA: Diagnosis not present

## 2023-08-06 DIAGNOSIS — R519 Headache, unspecified: Secondary | ICD-10-CM | POA: Diagnosis not present

## 2023-09-21 DIAGNOSIS — R5383 Other fatigue: Secondary | ICD-10-CM | POA: Diagnosis not present

## 2023-09-21 DIAGNOSIS — E039 Hypothyroidism, unspecified: Secondary | ICD-10-CM | POA: Diagnosis not present

## 2023-09-21 DIAGNOSIS — Z79899 Other long term (current) drug therapy: Secondary | ICD-10-CM | POA: Diagnosis not present

## 2023-09-21 DIAGNOSIS — Z299 Encounter for prophylactic measures, unspecified: Secondary | ICD-10-CM | POA: Diagnosis not present

## 2023-09-21 DIAGNOSIS — Z6832 Body mass index (BMI) 32.0-32.9, adult: Secondary | ICD-10-CM | POA: Diagnosis not present

## 2023-09-21 DIAGNOSIS — Z0000 Encounter for general adult medical examination without abnormal findings: Secondary | ICD-10-CM | POA: Diagnosis not present

## 2023-09-21 DIAGNOSIS — Z7189 Other specified counseling: Secondary | ICD-10-CM | POA: Diagnosis not present

## 2023-09-21 DIAGNOSIS — Z1339 Encounter for screening examination for other mental health and behavioral disorders: Secondary | ICD-10-CM | POA: Diagnosis not present

## 2023-09-21 DIAGNOSIS — Z1331 Encounter for screening for depression: Secondary | ICD-10-CM | POA: Diagnosis not present

## 2023-09-21 DIAGNOSIS — E7800 Pure hypercholesterolemia, unspecified: Secondary | ICD-10-CM | POA: Diagnosis not present

## 2023-09-21 DIAGNOSIS — I10 Essential (primary) hypertension: Secondary | ICD-10-CM | POA: Diagnosis not present

## 2023-10-09 ENCOUNTER — Emergency Department (HOSPITAL_COMMUNITY): Payer: Medicare Other

## 2023-10-09 ENCOUNTER — Emergency Department (HOSPITAL_COMMUNITY)
Admission: EM | Admit: 2023-10-09 | Discharge: 2023-10-09 | Disposition: A | Discharge status: Home / Self Care | Payer: Medicare Other | Attending: Emergency Medicine | Admitting: Emergency Medicine

## 2023-10-09 ENCOUNTER — Encounter (HOSPITAL_COMMUNITY): Payer: Self-pay | Admitting: *Deleted

## 2023-10-09 ENCOUNTER — Other Ambulatory Visit: Payer: Self-pay

## 2023-10-09 DIAGNOSIS — R519 Headache, unspecified: Secondary | ICD-10-CM | POA: Insufficient documentation

## 2023-10-09 DIAGNOSIS — I10 Essential (primary) hypertension: Secondary | ICD-10-CM | POA: Diagnosis not present

## 2023-10-09 DIAGNOSIS — R110 Nausea: Secondary | ICD-10-CM | POA: Diagnosis not present

## 2023-10-09 DIAGNOSIS — Z79899 Other long term (current) drug therapy: Secondary | ICD-10-CM | POA: Diagnosis not present

## 2023-10-09 DIAGNOSIS — I6782 Cerebral ischemia: Secondary | ICD-10-CM | POA: Diagnosis not present

## 2023-10-09 HISTORY — DX: Tinnitus, bilateral: H93.13

## 2023-10-09 MED ORDER — IBUPROFEN 400 MG PO TABS
600.0000 mg | ORAL_TABLET | Freq: Once | ORAL | Status: AC
  Administered 2023-10-09: 600 mg via ORAL
  Filled 2023-10-09: qty 2

## 2023-10-09 NOTE — Discharge Instructions (Signed)
Continue ibuprofen 600 mg every 6 hours for headache pain. Follow up with your doctor for recheck in one week.   If you have new or worsening symptoms, please return to the emergency department for further evaluation.

## 2023-10-09 NOTE — ED Triage Notes (Signed)
Pt with right sided HA with nausea x 2 weeks comes and goes.  Pt with acid reflux, denies taking anything for the acid reflux.

## 2023-10-09 NOTE — ED Provider Notes (Signed)
[facility] AT [facility] Provider Note   CSN: [id number] Arrival date & time: 10/09/23  1556     History  Chief Complaint  Patient presents with   Headache    Alexa Wilson is a 77 y.o. female.  Patient to ED with complaint of right sided headache for the past 2 weeks. Remote history of migraines but she reports this feels different. No visual changes or photophobia. She reports nausea without vomiting. No fever, congestion or cough.   The history is provided by the patient. No language interpreter was used.  Headache      Home Medications Prior to Admission medications   Medication Sig Start Date End Date Taking? Authorizing Provider  acetaminophen (TYLENOL) 500 MG tablet Take 500 mg by mouth every 6 (six) hours as needed for mild pain or moderate pain.    [provider]  alprazolam Prudy Feeler) 2 MG tablet Take 1 mg by mouth 3 (three) times daily.    [provider]  levothyroxine (SYNTHROID) 25 MCG tablet Take 25 mcg by mouth daily before breakfast. 12/11/19   [provider]  metoprolol succinate (TOPROL-XL) 25 MG 24 hr tablet Take 0.5 tablets (12.5 mg total) by mouth daily. 09/12/21   Lewayne Bunting, MD  ondansetron (ZOFRAN) 4 MG tablet Take 1 tablet (4 mg total) by mouth every 6 (six) hours. Patient taking differently: Take 4 mg by mouth every 8 (eight) hours as needed. 07/31/22   Cristopher Peru, PA-C  OVER THE COUNTER MEDICATION Vit D 3 takes one every 2-3 days    [provider]  polyethylene glycol (MIRALAX / GLYCOLAX) 17 g packet Take 17 g by mouth daily as needed for mild constipation or moderate constipation.    [provider]  rosuvastatin (CRESTOR) 10 MG tablet Take 10 mg by mouth daily at 6 PM. 11/10/19   [provider]      Allergies    Ciprofloxacin, Codeine, Metronidazole, Morphine and codeine, and Other    Review of Systems   Review of Systems  Neurological:  Positive  for headaches.    Physical Exam Updated Vital Signs BP (!) 144/67 (BP Location: Right Arm)   Pulse 75   Temp 98.7 F (37.1 C) (Oral)   Resp 17   Ht 5\' 6"  (1.676 m)   Wt 91.6 kg   SpO2 97%   BMI 32.60 kg/m  Physical Exam Vitals and nursing note reviewed.  Constitutional:      Appearance: She is well-developed.  HENT:     Head: Normocephalic.  Eyes:     Pupils: Pupils are equal, round, and reactive to light.  Cardiovascular:     Rate and Rhythm: Normal rate and regular rhythm.  Pulmonary:     Effort: Pulmonary effort is normal.     Breath sounds: Normal breath sounds.  Abdominal:     General: Bowel sounds are normal.     Palpations: Abdomen is soft.     Tenderness: There is no abdominal tenderness. There is no guarding or rebound.  Musculoskeletal:        General: Normal range of motion.     Cervical back: Normal range of motion and neck supple.  Skin:    General: Skin is warm and dry.     Findings: No rash.  Neurological:     Mental Status: She is alert and oriented to person, place, and time.     GCS: GCS eye subscore is 4. GCS  verbal subscore is 5. GCS motor subscore is 6.     Cranial Nerves: No cranial nerve deficit.     Sensory: No sensory deficit.     Coordination: Coordination normal.     Deep Tendon Reflexes: Reflexes are normal and symmetric. Reflexes normal.  Psychiatric:        Speech: Speech normal.     ED Results / Procedures / Treatments   Labs (all labs ordered are listed, but only abnormal results are displayed) Labs Reviewed - No data to display  EKG None  Radiology CT Head Wo Contrast  Result Date: 10/09/2023 CLINICAL DATA:  Right headache, nausea EXAM: CT HEAD WITHOUT CONTRAST TECHNIQUE: Contiguous axial images were obtained from the base of the skull through the vertex without intravenous contrast. RADIATION DOSE REDUCTION: This exam was performed according to the departmental dose-optimization program which includes automated exposure  control, adjustment of the mA and/or kV according to patient size and/or use of iterative reconstruction technique. COMPARISON:  07/15/2013 FINDINGS: Brain: No evidence of acute infarction, hemorrhage, hydrocephalus, extra-axial collection or mass lesion/mass effect. Subcortical white matter and periventricular small vessel ischemic changes. Vascular: No hyperdense vessel or unexpected calcification. Skull: Normal. Negative for fracture or focal lesion. Sinuses/Orbits: The visualized paranasal sinuses are essentially clear. The mastoid air cells are unopacified. Other: None. IMPRESSION: No acute intracranial abnormality. Small vessel ischemic changes. Electronically Signed   By: Charline Bills M.D.   On: 10/09/2023 17:49    Procedures Procedures    Medications Ordered in ED Medications  ibuprofen (ADVIL) tablet 600 mg (600 mg Oral Given 10/09/23 1733)    ED Course/ Medical Decision Making/ A&P                                 Medical Decision Making This patient presents to the ED for concern of headache, this involves an extensive number of treatment options, and is a complaint that carries with it a high risk of complications and morbidity.  The differential diagnosis includes migraine, bleed, mass, meningitis   Co morbidities that complicate the patient evaluation  Thyroid dz, HTN, HLD   Additional history obtained:  Additional history and/or information obtained from chart review, notable for Family at bedside    Imaging Studies ordered:  I ordered imaging studies including Head CT Radiologist interpretation: No acute process  Medicines ordered and prescription drug management:  I ordered medication including ibuprofen  for pain Reevaluation of the patient after these medicines showed that the patient improved I have reviewed the patients home medicines and have made adjustments as needed     Problem List / ED Course:  Well appearing patient presents for mild  headache, intermittent in nature. She reports nausea without vomiting.  Neuro exam is nonfocal. She is engaged in conversation with family, in NAD. VSS. Head CT negative Ibuprofen offered some relief.  She is felt stable for discharge home Follow up with PCP if headache persists.     Social Determinants of Health:  Good family support   Disposition:  After consideration of the diagnostic results and the patients response to treatment, I feel that the patient would benefit from Discharge home..   Amount and/or Complexity of Data Reviewed Radiology: ordered.  Risk Prescription drug management.           Final Clinical Impression(s) / ED Diagnoses Final diagnoses:  Acute nonintractable headache, unspecified headache type    Rx / DC  Orders ED Discharge Orders     None         Danne Harbor 10/09/23 1832    Bethann Berkshire, MD 10/10/23 1209

## 2023-10-09 NOTE — ED Notes (Signed)
ED Provider at bedside. 

## 2023-10-09 NOTE — ED Notes (Signed)
Pt lying in bed with family at bedside. Stated head hurts on right side and that it comes and goes. Denies vomiting but has had episodes of diarrhea some with blood. Denies any recent trauma. A&O x4. Speech is clear and appropriate at baseline.

## 2023-10-15 ENCOUNTER — Telehealth: Payer: Self-pay | Admitting: *Deleted

## 2023-10-15 NOTE — Progress Notes (Unsigned)
  Care Coordination  Outreach Note  10/15/2023 Name: Alexa Wilson MRN: [id number] DOB: Jun 25, 1946   Care Coordination Outreach Attempts: An unsuccessful telephone outreach was attempted today to offer the patient information about available care coordination services.  Follow Up Plan:  Additional outreach attempts will be made to offer the patient care coordination information and services.   Encounter Outcome:  No Answer  Gwenevere Ghazi  Care Coordination Care Guide  Direct Dial: [phone]

## 2023-10-18 DIAGNOSIS — I10 Essential (primary) hypertension: Secondary | ICD-10-CM | POA: Diagnosis not present

## 2023-10-18 DIAGNOSIS — Z299 Encounter for prophylactic measures, unspecified: Secondary | ICD-10-CM | POA: Diagnosis not present

## 2023-10-18 DIAGNOSIS — R519 Headache, unspecified: Secondary | ICD-10-CM | POA: Diagnosis not present

## 2023-10-18 DIAGNOSIS — R0981 Nasal congestion: Secondary | ICD-10-CM | POA: Diagnosis not present

## 2023-10-18 NOTE — Progress Notes (Signed)
  Care Coordination  Outreach Note  10/18/2023 Name: Alexa Wilson MRN: [id number] DOB: [birth year]   Care Coordination Outreach Attempts: A second unsuccessful outreach was attempted today to offer the patient with information about available care coordination services.  Follow Up Plan:  Additional outreach attempts will be made to offer the patient care coordination information and services.   Encounter Outcome:  No Answer  Gwenevere Ghazi  Care Coordination Care Guide  Direct Dial: [phone]

## 2023-10-18 NOTE — Progress Notes (Signed)
  Care Coordination   Note   10/18/2023 Name: STEVANNA KARREN MRN: [id number] DOB: 05-06-1946  LARONICA MCDANIELS is a 77 y.o. year old female who sees Vyas, Angelina Pih, MD for primary care. I reached out to Wyline Beady by phone today to offer care coordination services.  Ms. Caras was given information about Care Coordination services today including:   The Care Coordination services include support from the care team which includes your Nurse Coordinator, Clinical Social Worker, or Pharmacist.  The Care Coordination team is here to help remove barriers to the health concerns and goals most important to you. Care Coordination services are voluntary, and the patient may decline or stop services at any time by request to their care team member.   Care Coordination Consent Status: Patient agreed to services and verbal consent obtained.   Follow up plan:  Telephone appointment with care coordination team member scheduled for:  11/01/23  Encounter Outcome:  Patient Scheduled  [facility] Coordination Care Guide  Direct Dial: [phone]

## 2023-11-01 ENCOUNTER — Encounter: Payer: Self-pay | Admitting: *Deleted

## 2023-11-01 ENCOUNTER — Ambulatory Visit: Payer: Self-pay | Admitting: *Deleted

## 2023-11-01 NOTE — Patient Instructions (Signed)
Visit Information  Thank you for taking time to visit with me today. Please don't hesitate to contact me if I can be of assistance to you.   Following are the goals we discussed today:   Goals Addressed               This Visit's Progress     Receive Counseling & Supportive Services. (pt-stated)   On track     Care Coordination Interventions:  Interventions Today    Flowsheet Row Most Recent Value  Chronic Disease   Chronic disease during today's visit Other  [Dysphagia, Panic Disorder, Death of Son 26-Nov-2022, Depression, Sadness, Grief & Loss.]  General Interventions   General Interventions Discussed/Reviewed --  [Encouraged Routine Engagement with Care Team Members & Providers.]  Labs --  [Encouraged Routine Labwork.]  Vaccines --  [Encouraged Annual Vaccinations.]  Doctor Visits Discussed/Reviewed Doctor Visits Discussed, Specialist, Doctor Visits Reviewed, Annual Wellness Visits, PCP  [Encouraged Routine Engagement with Care Team Members & Providers.]  Health Screening Bone Density, Colonoscopy, Mammogram  [Encouraged Annual Health Screenings.]  Durable Medical Equipment (DME) Other, Dan Humphreys, BP Cuff  [Reacher, Scales, Medical laboratory scientific officer, Prescription Eyeglasses, Engineer, materials in Shower & Hand-Held Shower Hose.]  PCP/Specialist Visits Compliance with follow-up visit  [Encouraged Routine Engagement with Care Team Members & Providers.]  Communication with PCP/Specialists, Charity fundraiser, Pharmacists, Social Work  [employer] Routine Engagement with Care Team Members & Providers.]  Level of Care Adult Daycare, Air traffic controller, Assisted Living, Skilled Nursing Facility  [Confirmed Disinterest in Enrollment in Adult Day Care Program. Confirmed Disinterest in Pursuing Higher Level of Care Placement Options (I.e Assisted Living Versus Skilled Nursing Facility).]  Applications Medicaid, Personal Care Services  [Confirmed Disinterest in Applying for Medicaid or Personal Care Services.]  Exercise Interventions   Exercise  Discussed/Reviewed Exercise Discussed, Assistive device use and maintanence, Exercise Reviewed, Physical Activity, Weight Managment  [Encouraged Participation in Daily Exercise Regimen.]  Physical Activity Discussed/Reviewed Physical Activity Discussed, Home Exercise Program (HEP), PREP, Physical Activity Reviewed, Gym, Types of exercise  [Encouraged Increased Level of Activity & Exercise, Inside & Outside the Home, as Tolerated.]  Weight Management Weight loss  [Encouraged Healthy Weight Loss Program.]  Education Interventions   Education Provided Provided Therapist, sports, Provided Web-based Education, Provided Education  [employer] Reviewed Educational Material & Encouraged Implementation.]  Provided Verbal Education On Nutrition, Mental Health/Coping with Illness, When to see the doctor, Foot Care, Eye Care, Labs, Blood Sugar Monitoring, Applications, Exercise, Medication, Development worker, community, [employer]  [employer] Consideration of Educational Material Reviewed.]  Ship broker, Personal Care Services  [Confirmed Disinterest in Applying for [employer] or Personal Care Services.]  Mental Health Interventions   Mental Health Discussed/Reviewed Mental Health Discussed, Mental Health Reviewed, Coping Strategies, Anxiety, Depression, Grief and Loss, Substance Abuse, Suicide, Crisis, Other  [Assessed Mental Health & Cognitive Status.]  Nutrition Interventions   Nutrition Discussed/Reviewed Nutrition Discussed, Adding fruits and vegetables, Increasing proteins, Decreasing fats, Fluid intake, Nutrition Reviewed, Carbohydrate meal planning, Portion sizes, Decreasing salt, Decreasing sugar intake  [Encouraged Heart-Healthy, Low Fat, Reduced Sugar, Low Sodium Diet.]  Pharmacy Interventions   Pharmacy Dicussed/Reviewed Pharmacy Topics Discussed, Medications and their functions, Pharmacy Topics Reviewed, Medication Adherence, Affording Medications  [Confirmed Ability to Afford Prescription  Medications.]  Medication Adherence --  [Confirmed Compliance with Prescription Medications.]  Safety Interventions   Safety Discussed/Reviewed Safety Discussed, Safety Reviewed, Fall Risk, Home Safety  [Encouraged Consideration of Home Safety Evaluation.]  Home Safety Assistive Devices, Need for home safety assessment, Refer for community resources  [Encouraged Routine  Use of Assistive Devices & Durable Medical Equipment.]  Advanced Directive Interventions   Advanced Directives Discussed/Reviewed Advanced Directives Discussed, Advanced Directives Reviewed  [Encouraged Initiation of Advanced Directives (Living Will & Healthcare Power of Attorney Documents), Offering to [employer], Assist with Completion, Make Copies & Scan into Electronic Medical Record in Epic.]        Assessed Social Determinant of Health Barriers. Discussed Plans for Ongoing Care Management Follow Up. Provided Careers information officer Information for Care Management Team Members. Screened for Signs & Symptoms of Depression, Related to Chronic Disease State.  PHQ2 & PHQ9 Depression Screen Completed & Results Reviewed.  Suicidal Ideation & Homicidal Ideation Assessed - None Present.   Domestic Violence Assessed - None Present. Access to Weapons Assessed - None Present.   Active Listening & Reflection Utilized.  Verbalization of Feelings Encouraged.  Emotional Support Provided. Feelings of Sadness Validated. Symptoms of Grief & Loss Acknowledged. Grief & Loss Resources Reviewed. Grief & Loss Support Groups Mailed. Self-Enrollment in Grief & Loss Support Group of Interest Emphasized. Crisis Support Information, Agencies, Services & Resources Discussed. Problem Solving Interventions Identified. Task-Centered Solutions Implemented.   Solution-Focused Strategies Developed. Acceptance & Commitment Therapy Introduced. Brief Cognitive Behavioral Therapy Initiated. Client-Centered Therapy Enacted. Reviewed Prescription Medications &  Discussed Importance of Compliance. Quality of Sleep Assessed & Sleep Hygiene Techniques Promoted. Encouraged Routine Engagement with Danford Bad, Licensed Clinical Social Worker with [facility] [phone]), If You Have Questions, Need Assistance, Or If Additional Social Work Needs Are Identified Between Now & Our Next Follow-Up Outreach Call, Scheduled on 11/22/2023 at 10:45 AM.      Our next appointment is by telephone on 11/22/2023 at 10:45 am.  Please call the care guide team at [phone] if you need to cancel or reschedule your appointment.   If you are experiencing a Mental Health or Behavioral Health Crisis or need someone to talk to, please call the Suicide and Crisis Lifeline: 988 call the [place] National Suicide Prevention Lifeline: 409 264 4854 or TTY: (906)603-2015 TTY (409)265-3820) to talk to a trained counselor call 1-800-273-TALK (toll free, 24 hour hotline) go to [facility] [address] [place] [phone]) call the [facility] Crisis Line: ([phone] call 911  Patient verbalizes understanding of instructions and care plan provided today and agrees to view in MyChart. Active MyChart status and patient understanding of how to access instructions and care plan via MyChart confirmed with patient.     Telephone follow up appointment with care management team member scheduled for:  11/22/2023 at 10:45 am.  Danford Bad, BSW, MSW, LCSW  Embedded Practice Social Work Case Manager  [facility], Population Health Direct Dial: [phone]  Fax: [phone] Email: [email or web address]: [email or web address]

## 2023-11-01 NOTE — Patient Outreach (Signed)
Care Coordination   Initial Visit Note   11/01/2023  Name: Alexa Wilson MRN: [id number] DOB: 14-Jan-1946  RIYAN FADNESS is a 77 y.o. year old female who sees Vyas, Angelina Pih, MD for primary care. I spoke with Wyline Beady by phone today.  What matters to the patients health and wellness today?  Receive Counseling & Supportive Services.    Goals Addressed               This Visit's Progress     Receive Counseling & Supportive Services. (pt-stated)   On track     Care Coordination Interventions:  Interventions Today    Flowsheet Row Most Recent Value  Chronic Disease   Chronic disease during today's visit Other  [Dysphagia, Panic Disorder, Death of Son Nov 27, 2022, Depression, Sadness, Grief & Loss.]  General Interventions   General Interventions Discussed/Reviewed --  [Encouraged Routine Engagement with Care Team Members & Providers.]  Labs --  [Encouraged Routine Labwork.]  Vaccines --  [Encouraged Annual Vaccinations.]  Doctor Visits Discussed/Reviewed Doctor Visits Discussed, Specialist, Doctor Visits Reviewed, Annual Wellness Visits, PCP  [Encouraged Routine Engagement with Care Team Members & Providers.]  Health Screening Bone Density, Colonoscopy, Mammogram  [Encouraged Annual Health Screenings.]  Durable Medical Equipment (DME) Other, Dan Humphreys, BP Cuff  [Reacher, Scales, Medical laboratory scientific officer, Prescription Eyeglasses, Engineer, materials in Shower & Hand-Held Shower Hose.]  PCP/Specialist Visits Compliance with follow-up visit  [Encouraged Routine Engagement with Care Team Members & Providers.]  Communication with PCP/Specialists, Charity fundraiser, Pharmacists, Social Work  [employer] Routine Engagement with Care Team Members & Providers.]  Level of Care Adult Daycare, Air traffic controller, Assisted Living, Skilled Nursing Facility  [Confirmed Disinterest in Enrollment in Adult Day Care Program. Confirmed Disinterest in Pursuing Higher Level of Care Placement Options (I.e Assisted Living Versus Skilled Nursing  Facility).]  Applications Medicaid, Personal Care Services  [Confirmed Disinterest in Applying for Medicaid or Personal Care Services.]  Exercise Interventions   Exercise Discussed/Reviewed Exercise Discussed, Assistive device use and maintanence, Exercise Reviewed, Physical Activity, Weight Managment  [Encouraged Participation in Daily Exercise Regimen.]  Physical Activity Discussed/Reviewed Physical Activity Discussed, Home Exercise Program (HEP), PREP, Physical Activity Reviewed, Gym, Types of exercise  [Encouraged Increased Level of Activity & Exercise, Inside & Outside the Home, as Tolerated.]  Weight Management Weight loss  [Encouraged Healthy Weight Loss Program.]  Education Interventions   Education Provided Provided Therapist, sports, Provided Web-based Education, Provided Education  [employer] Reviewed Educational Material & Encouraged Implementation.]  Provided Verbal Education On Nutrition, Mental Health/Coping with Illness, When to see the doctor, Foot Care, Eye Care, Labs, Blood Sugar Monitoring, Applications, Exercise, Medication, Development worker, community, [employer]  [employer] Consideration of Educational Material Reviewed.]  Ship broker, Personal Care Services  [Confirmed Disinterest in Applying for [employer] or Personal Care Services.]  Mental Health Interventions   Mental Health Discussed/Reviewed Mental Health Discussed, Mental Health Reviewed, Coping Strategies, Anxiety, Depression, Grief and Loss, Substance Abuse, Suicide, Crisis, Other  [Assessed Mental Health & Cognitive Status.]  Nutrition Interventions   Nutrition Discussed/Reviewed Nutrition Discussed, Adding fruits and vegetables, Increasing proteins, Decreasing fats, Fluid intake, Nutrition Reviewed, Carbohydrate meal planning, Portion sizes, Decreasing salt, Decreasing sugar intake  [Encouraged Heart-Healthy, Low Fat, Reduced Sugar, Low Sodium Diet.]  Pharmacy Interventions   Pharmacy Dicussed/Reviewed  Pharmacy Topics Discussed, Medications and their functions, Pharmacy Topics Reviewed, Medication Adherence, Affording Medications  [Confirmed Ability to Afford Prescription Medications.]  Medication Adherence --  [Confirmed Compliance with Prescription Medications.]  Safety Interventions   Safety Discussed/Reviewed Safety Discussed, Safety  Reviewed, Fall Risk, Home Safety  [Encouraged Consideration of Home Safety Evaluation.]  Home Safety Assistive Devices, Need for home safety assessment, Refer for community resources  [Encouraged Routine Use of Assistive Devices & Durable Medical Equipment.]  Advanced Directive Interventions   Advanced Directives Discussed/Reviewed Advanced Directives Discussed, Advanced Directives Reviewed  [Encouraged Initiation of Advanced Directives (Living Will & Healthcare Power of Corporate treasurer), Offering to [employer], Assist with Completion, Make Copies & Scan into Electronic Medical Record in Epic.]        Assessed Social Determinant of Health Barriers. Discussed Plans for Ongoing Care Management Follow Up. Provided Careers information officer Information for Care Management Team Members. Screened for Signs & Symptoms of Depression, Related to Chronic Disease State.  PHQ2 & PHQ9 Depression Screen Completed & Results Reviewed.  Suicidal Ideation & Homicidal Ideation Assessed - None Present.   Domestic Violence Assessed - None Present. Access to Weapons Assessed - None Present.   Active Listening & Reflection Utilized.  Verbalization of Feelings Encouraged.  Emotional Support Provided. Feelings of Sadness Validated. Symptoms of Grief & Loss Acknowledged. Grief & Loss Resources Reviewed. Grief & Loss Support Groups Mailed. Self-Enrollment in Grief & Loss Support Group of Interest Emphasized. Crisis Support Information, Agencies, Services & Resources Discussed. Problem Solving Interventions Identified. Task-Centered Solutions Implemented.   Solution-Focused Strategies  Developed. Acceptance & Commitment Therapy Introduced. Brief Cognitive Behavioral Therapy Initiated. Client-Centered Therapy Enacted. Reviewed Prescription Medications & Discussed Importance of Compliance. Quality of Sleep Assessed & Sleep Hygiene Techniques Promoted. Encouraged Routine Engagement with Danford Bad, Licensed Clinical Social Worker with [facility] [phone]), If You Have Questions, Need Assistance, Or If Additional Social Work Needs Are Identified Between Now & Our Next Follow-Up Outreach Call, Scheduled on 11/22/2023 at 10:45 AM.        SDOH assessments and interventions completed:  Yes.  SDOH Interventions Today    Flowsheet Row Most Recent Value  SDOH Interventions   Food Insecurity Interventions Intervention Not Indicated  Housing Interventions Intervention Not Indicated  Transportation Interventions Intervention Not Indicated, Patient Resources (Friends/Family), Community Resources Provided  Utilities Interventions Intervention Not Indicated  Alcohol Usage Interventions Intervention Not Indicated (Score <7)  Financial Strain Interventions Intervention Not Indicated  Physical Activity Interventions Intervention Not Indicated, Community Resources Provided  Stress Interventions Intervention Not Indicated, Programmer, applications Provided, [employer], Provide Counseling  Social Connections Interventions Intervention Not Indicated, Community Resources Provided  Health Literacy Interventions Intervention Not Indicated     Care Coordination Interventions:  Yes, provided.   Follow up plan: Follow up call scheduled for 11/22/2023 at 10:45 am.  Encounter Outcome:  Patient Visit Completed.   Danford Bad, BSW, MSW, Printmaker Social Work Case Set designer Health  [facility], Population Health Direct Dial: [phone]  Fax: [phone] Email: [email or web address] Website:  [email or web address]

## 2023-11-22 ENCOUNTER — Ambulatory Visit: Payer: Self-pay | Admitting: *Deleted

## 2023-11-22 NOTE — Patient Instructions (Signed)
Visit Information  Thank you for taking time to visit with me today. Please don't hesitate to contact me if I can be of assistance to you.   Following are the goals we discussed today:   Goals Addressed               This Visit's Progress     COMPLETED: Receive Counseling & Supportive Services. (pt-stated)   On track     Care Coordination Interventions:  Interventions Today    Flowsheet Row Most Recent Value  Chronic Disease   Chronic disease during today's visit Other  [Dysphagia, Panic Disorder, Death of Son 12/07/2022, Depression, Sadness, Grief & Loss.]  General Interventions   General Interventions Discussed/Reviewed --  [Encouraged Routine Engagement with Care Team Members & Providers.]  Labs --  [Encouraged Routine Labwork.]  Vaccines --  [Encouraged Annual Vaccinations.]  Doctor Visits Discussed/Reviewed Doctor Visits Discussed, Specialist, Doctor Visits Reviewed, Annual Wellness Visits, PCP  [Encouraged Routine Engagement with Care Team Members & Providers.]  Health Screening Bone Density, Colonoscopy, Mammogram  [Encouraged Annual Health Screenings.]  Durable Medical Equipment (DME) Other, Dan Humphreys, BP Cuff  [Reacher, Scales, Medical laboratory scientific officer, Prescription Eyeglasses, Engineer, materials in Shower & Hand-Held Shower Hose.]  PCP/Specialist Visits Compliance with follow-up visit  [Encouraged Routine Engagement with Care Team Members & Providers.]  Communication with PCP/Specialists, Charity fundraiser, Pharmacists, Social Work  [employer] Routine Engagement with Care Team Members & Providers.]  Level of Care Adult Daycare, Air traffic controller, Assisted Living, Skilled Nursing Facility  [Confirmed Disinterest in Enrollment in Adult Day Care Program. Confirmed Disinterest in Pursuing Higher Level of Care Placement Options (I.e Assisted Living Versus Skilled Nursing Facility).]  Applications Medicaid, Personal Care Services  [Confirmed Disinterest in Applying for Medicaid or Personal Care Services.]  Exercise Interventions    Exercise Discussed/Reviewed Exercise Discussed, Assistive device use and maintanence, Exercise Reviewed, Physical Activity, Weight Managment  [Encouraged Participation in Daily Exercise Regimen.]  Physical Activity Discussed/Reviewed Physical Activity Discussed, Home Exercise Program (HEP), PREP, Physical Activity Reviewed, Gym, Types of exercise  [Encouraged Increased Level of Activity & Exercise, Inside & Outside the Home, as Tolerated.]  Weight Management Weight loss  [Encouraged Healthy Weight Loss Program.]  Education Interventions   Education Provided Provided Therapist, sports, Provided Web-based Education, Provided Education  [employer] Reviewed Educational Material & Encouraged Implementation.]  Provided Verbal Education On Nutrition, Mental Health/Coping with Illness, When to see the doctor, Foot Care, Eye Care, Labs, Blood Sugar Monitoring, Applications, Exercise, Medication, Development worker, community, [employer]  [employer] Consideration of Educational Material Reviewed.]  Ship broker, Personal Care Services  [Confirmed Disinterest in Applying for [employer] or Personal Care Services.]  Mental Health Interventions   Mental Health Discussed/Reviewed Mental Health Discussed, Mental Health Reviewed, Coping Strategies, Anxiety, Depression, Grief and Loss, Substance Abuse, Suicide, Crisis, Other  [Assessed Mental Health & Cognitive Status.]  Nutrition Interventions   Nutrition Discussed/Reviewed Nutrition Discussed, Adding fruits and vegetables, Increasing proteins, Decreasing fats, Fluid intake, Nutrition Reviewed, Carbohydrate meal planning, Portion sizes, Decreasing salt, Decreasing sugar intake  [Encouraged Heart-Healthy, Low Fat, Reduced Sugar, Low Sodium Diet.]  Pharmacy Interventions   Pharmacy Dicussed/Reviewed Pharmacy Topics Discussed, Medications and their functions, Pharmacy Topics Reviewed, Medication Adherence, Affording Medications  [Confirmed Ability to Afford  Prescription Medications.]  Medication Adherence --  [Confirmed Compliance with Prescription Medications.]  Safety Interventions   Safety Discussed/Reviewed Safety Discussed, Safety Reviewed, Fall Risk, Home Safety  [Encouraged Consideration of Home Safety Evaluation.]  Home Safety Assistive Devices, Need for home safety assessment, Refer for community resources  [Encouraged  Routine Use of Assistive Devices & Durable Medical Equipment.]  Advanced Directive Interventions   Advanced Directives Discussed/Reviewed Advanced Directives Discussed, Advanced Directives Reviewed  [Encouraged Initiation of Advanced Directives (Living Will & Healthcare Power of Corporate treasurer), Offering to [employer], Assist with Completion, Make Copies & Scan into Electronic Medical Record in Epic.]        Active Listening & Reflection Utilized.  Verbalization of Feelings Encouraged.  Emotional Support Provided. Problem Solving Interventions Activated. Task-Centered Solutions Employed.   Solution-Focused Strategies Implemented. Acceptance & Commitment Therapy Initiated. Cognitive Behavioral Therapy Performed. Encouraged Engagement with Danford Bad, Licensed Clinical Social Worker with [facility] [phone]), If You Have Questions, Need Assistance, If Additional Social Work Needs Are Identified in The Near Future, or If You Change Your Mind About Wanting to Receive Social Work Services.       Please call the care guide team at [phone] if you need to cancel or reschedule your appointment.   If you are experiencing a Mental Health or Behavioral Health Crisis or need someone to talk to, please call the Suicide and Crisis Lifeline: 988 call the [place] National Suicide Prevention Lifeline: 570-057-8159 or TTY: 509-611-7458 TTY 629-485-0253) to talk to a trained counselor call 1-800-273-TALK (toll free, 24 hour hotline) go to [facility] [address], [place] [phone]) call the [facility] Crisis Line: [phone] call 911  Patient verbalizes understanding of instructions and care plan provided today and agrees to view in MyChart. Active MyChart status and patient understanding of how to access instructions and care plan via MyChart confirmed with patient.     No further follow up required.   Danford Bad, BSW, MSW, LCSW [facility], [facility] Clinical Social Worker II Direct Dial: [phone]  Fax: [phone] Website: [email or web address]

## 2023-11-22 NOTE — Patient Outreach (Signed)
Care Coordination   Follow Up Visit Note   11/22/2023  Name: VERNAMAE CRIPE MRN: [id number] DOB: 11-Mar-1946  Alexa Wilson is a 78 y.o. year old female who sees Vyas, Angelina Pih, MD for primary care. I spoke with Wyline Beady by phone today.  What matters to the patients health and wellness today?  Receive Counseling & Supportive Services.   Goals Addressed               This Visit's Progress     COMPLETED: Receive Counseling & Supportive Services. (pt-stated)   On track     Care Coordination Interventions:  Interventions Today    Flowsheet Row Most Recent Value  Chronic Disease   Chronic disease during today's visit Other  [Dysphagia, Panic Disorder, Death of Son 06-Dec-2022, Depression, Sadness, Grief & Loss.]  General Interventions   General Interventions Discussed/Reviewed --  [Encouraged Routine Engagement with Care Team Members & Providers.]  Labs --  [Encouraged Routine Labwork.]  Vaccines --  [Encouraged Annual Vaccinations.]  Doctor Visits Discussed/Reviewed Doctor Visits Discussed, Specialist, Doctor Visits Reviewed, Annual Wellness Visits, PCP  [Encouraged Routine Engagement with Care Team Members & Providers.]  Health Screening Bone Density, Colonoscopy, Mammogram  [Encouraged Annual Health Screenings.]  Durable Medical Equipment (DME) Other, Dan Humphreys, BP Cuff  [Reacher, Scales, Medical laboratory scientific officer, Prescription Eyeglasses, Engineer, materials in Shower & Hand-Held Shower Hose.]  PCP/Specialist Visits Compliance with follow-up visit  [Encouraged Routine Engagement with Care Team Members & Providers.]  Communication with PCP/Specialists, Charity fundraiser, Pharmacists, Social Work  [employer] Routine Engagement with Care Team Members & Providers.]  Level of Care Adult Daycare, Air traffic controller, Assisted Living, Skilled Nursing Facility  [Confirmed Disinterest in Enrollment in Adult Day Care Program. Confirmed Disinterest in Pursuing Higher Level of Care Placement Options (I.e Assisted Living Versus Skilled  Nursing Facility).]  Applications Medicaid, Personal Care Services  [Confirmed Disinterest in Applying for Medicaid or Personal Care Services.]  Exercise Interventions   Exercise Discussed/Reviewed Exercise Discussed, Assistive device use and maintanence, Exercise Reviewed, Physical Activity, Weight Managment  [Encouraged Participation in Daily Exercise Regimen.]  Physical Activity Discussed/Reviewed Physical Activity Discussed, Home Exercise Program (HEP), PREP, Physical Activity Reviewed, Gym, Types of exercise  [Encouraged Increased Level of Activity & Exercise, Inside & Outside the Home, as Tolerated.]  Weight Management Weight loss  [Encouraged Healthy Weight Loss Program.]  Education Interventions   Education Provided Provided Therapist, sports, Provided Web-based Education, Provided Education  [employer] Reviewed Educational Material & Encouraged Implementation.]  Provided Verbal Education On Nutrition, Mental Health/Coping with Illness, When to see the doctor, Foot Care, Eye Care, Labs, Blood Sugar Monitoring, Applications, Exercise, Medication, Development worker, community, [employer]  [employer] Consideration of Educational Material Reviewed.]  Ship broker, Personal Care Services  [Confirmed Disinterest in Applying for [employer] or Personal Care Services.]  Mental Health Interventions   Mental Health Discussed/Reviewed Mental Health Discussed, Mental Health Reviewed, Coping Strategies, Anxiety, Depression, Grief and Loss, Substance Abuse, Suicide, Crisis, Other  [Assessed Mental Health & Cognitive Status.]  Nutrition Interventions   Nutrition Discussed/Reviewed Nutrition Discussed, Adding fruits and vegetables, Increasing proteins, Decreasing fats, Fluid intake, Nutrition Reviewed, Carbohydrate meal planning, Portion sizes, Decreasing salt, Decreasing sugar intake  [Encouraged Heart-Healthy, Low Fat, Reduced Sugar, Low Sodium Diet.]  Pharmacy Interventions   Pharmacy Dicussed/Reviewed  Pharmacy Topics Discussed, Medications and their functions, Pharmacy Topics Reviewed, Medication Adherence, Affording Medications  [Confirmed Ability to Afford Prescription Medications.]  Medication Adherence --  [Confirmed Compliance with Prescription Medications.]  Safety Interventions   Safety Discussed/Reviewed Safety Discussed,  Safety Reviewed, Fall Risk, Home Safety  [Encouraged Consideration of Home Safety Evaluation.]  Home Safety Assistive Devices, Need for home safety assessment, Refer for community resources  [Encouraged Routine Use of Assistive Devices & Durable Medical Equipment.]  Advanced Directive Interventions   Advanced Directives Discussed/Reviewed Advanced Directives Discussed, Advanced Directives Reviewed  [Encouraged Initiation of Advanced Directives (Living Will & Healthcare Power of Corporate treasurer), Offering to [employer], Assist with Completion, Make Copies & Scan into Electronic Medical Record in Epic.]        Active Listening & Reflection Utilized.  Verbalization of Feelings Encouraged.  Emotional Support Provided. Problem Solving Interventions Activated. Task-Centered Solutions Employed.   Solution-Focused Strategies Implemented. Acceptance & Commitment Therapy Initiated. Cognitive Behavioral Therapy Performed. Encouraged Engagement with Danford Bad, Licensed Clinical Social Worker with [facility] [phone]), If You Have Questions, Need Assistance, If Additional Social Work Needs Are Identified in The Near Future, or If You Change Your Mind About Wanting to Receive Social Work Services.       SDOH assessments and interventions completed:  Yes.  Care Coordination Interventions:  Yes, provided.   Follow up plan: No further intervention required.   Encounter Outcome:  Patient Visit Completed.    Danford Bad, BSW, MSW, LCSW [facility], [facility] Clinical Social Worker II Direct Dial:  [phone]  Fax: [phone] Website: [email or web address]

## 2024-01-18 DIAGNOSIS — D692 Other nonthrombocytopenic purpura: Secondary | ICD-10-CM | POA: Diagnosis not present

## 2024-01-18 DIAGNOSIS — F322 Major depressive disorder, single episode, severe without psychotic features: Secondary | ICD-10-CM | POA: Diagnosis not present

## 2024-01-18 DIAGNOSIS — E7800 Pure hypercholesterolemia, unspecified: Secondary | ICD-10-CM | POA: Diagnosis not present

## 2024-01-18 DIAGNOSIS — I700 Atherosclerosis of aorta: Secondary | ICD-10-CM | POA: Diagnosis not present

## 2024-01-18 DIAGNOSIS — I10 Essential (primary) hypertension: Secondary | ICD-10-CM | POA: Diagnosis not present

## 2024-01-18 DIAGNOSIS — Z299 Encounter for prophylactic measures, unspecified: Secondary | ICD-10-CM | POA: Diagnosis not present

## 2024-01-27 DIAGNOSIS — I10 Essential (primary) hypertension: Secondary | ICD-10-CM | POA: Diagnosis not present

## 2024-01-27 DIAGNOSIS — Z299 Encounter for prophylactic measures, unspecified: Secondary | ICD-10-CM | POA: Diagnosis not present

## 2024-01-27 DIAGNOSIS — H9202 Otalgia, left ear: Secondary | ICD-10-CM | POA: Diagnosis not present

## 2024-02-07 DIAGNOSIS — Z299 Encounter for prophylactic measures, unspecified: Secondary | ICD-10-CM | POA: Diagnosis not present

## 2024-02-07 DIAGNOSIS — M7989 Other specified soft tissue disorders: Secondary | ICD-10-CM | POA: Diagnosis not present

## 2024-02-07 DIAGNOSIS — I10 Essential (primary) hypertension: Secondary | ICD-10-CM | POA: Diagnosis not present

## 2024-03-02 ENCOUNTER — Encounter (HOSPITAL_COMMUNITY): Payer: Self-pay

## 2024-03-02 ENCOUNTER — Emergency Department (HOSPITAL_COMMUNITY)
Admission: EM | Admit: 2024-03-02 | Discharge: 2024-03-02 | Disposition: A | Discharge status: Home / Self Care | Attending: Emergency Medicine | Admitting: Emergency Medicine

## 2024-03-02 ENCOUNTER — Other Ambulatory Visit: Payer: Self-pay

## 2024-03-02 ENCOUNTER — Emergency Department (HOSPITAL_COMMUNITY)

## 2024-03-02 DIAGNOSIS — E876 Hypokalemia: Secondary | ICD-10-CM | POA: Insufficient documentation

## 2024-03-02 DIAGNOSIS — K5792 Diverticulitis of intestine, part unspecified, without perforation or abscess without bleeding: Secondary | ICD-10-CM | POA: Insufficient documentation

## 2024-03-02 DIAGNOSIS — K449 Diaphragmatic hernia without obstruction or gangrene: Secondary | ICD-10-CM | POA: Diagnosis not present

## 2024-03-02 DIAGNOSIS — R1032 Left lower quadrant pain: Secondary | ICD-10-CM | POA: Diagnosis present

## 2024-03-02 DIAGNOSIS — K7460 Unspecified cirrhosis of liver: Secondary | ICD-10-CM | POA: Diagnosis not present

## 2024-03-02 DIAGNOSIS — K5732 Diverticulitis of large intestine without perforation or abscess without bleeding: Secondary | ICD-10-CM | POA: Diagnosis not present

## 2024-03-02 DIAGNOSIS — K5900 Constipation, unspecified: Secondary | ICD-10-CM | POA: Diagnosis not present

## 2024-03-02 LAB — URINALYSIS, ROUTINE W REFLEX MICROSCOPIC
Bacteria, UA: NONE SEEN
Bilirubin Urine: NEGATIVE
Glucose, UA: NEGATIVE mg/dL
Hgb urine dipstick: NEGATIVE
Ketones, ur: NEGATIVE mg/dL
Nitrite: NEGATIVE
Protein, ur: NEGATIVE mg/dL
Specific Gravity, Urine: 1.005 (ref 1.005–1.030)
pH: 6 (ref 5.0–8.0)

## 2024-03-02 LAB — COMPREHENSIVE METABOLIC PANEL WITH GFR
ALT: 28 U/L (ref 0–44)
AST: 49 U/L — ABNORMAL HIGH (ref 15–41)
Albumin: 3.8 g/dL (ref 3.5–5.0)
Alkaline Phosphatase: 88 U/L (ref 38–126)
Anion gap: 10 (ref 5–15)
BUN: 11 mg/dL (ref 8–23)
CO2: 24 mmol/L (ref 22–32)
Calcium: 8.9 mg/dL (ref 8.9–10.3)
Chloride: 108 mmol/L (ref 98–111)
Creatinine, Ser: 0.81 mg/dL (ref 0.44–1.00)
GFR, Estimated: >60 mL/min (ref >60)
Glucose, Bld: 91 mg/dL (ref 70–99)
Potassium: 3.4 mmol/L — ABNORMAL LOW (ref 3.5–5.1)
Sodium: 142 mmol/L (ref 135–145)
Total Bilirubin: 1.1 mg/dL (ref 0.0–1.2)
Total Protein: 7.2 g/dL (ref 6.5–8.1)

## 2024-03-02 LAB — CBC
HCT: 39.7 % (ref 36.0–46.0)
Hemoglobin: 13.3 g/dL (ref 12.0–15.0)
MCH: 34.2 pg — ABNORMAL HIGH (ref 26.0–34.0)
MCHC: 33.5 g/dL (ref 30.0–36.0)
MCV: 102.1 fL — ABNORMAL HIGH (ref 80.0–100.0)
Platelets: 206 10*3/uL (ref 150–400)
RBC: 3.89 MIL/uL (ref 3.87–5.11)
RDW: 12.9 % (ref 11.5–15.5)
WBC: 6.1 10*3/uL (ref 4.0–10.5)
nRBC: 0 % (ref 0.0–0.2)

## 2024-03-02 LAB — LIPASE, BLOOD: Lipase: 41 U/L (ref 11–51)

## 2024-03-02 MED ORDER — KETOROLAC TROMETHAMINE 15 MG/ML IJ SOLN
15.0000 mg | Freq: Once | INTRAMUSCULAR | Status: AC
  Administered 2024-03-02: 15 mg via INTRAVENOUS
  Filled 2024-03-02: qty 1

## 2024-03-02 MED ORDER — SODIUM CHLORIDE 0.9 % IV BOLUS
1000.0000 mL | Freq: Once | INTRAVENOUS | Status: AC
  Administered 2024-03-02: 1000 mL via INTRAVENOUS

## 2024-03-02 MED ORDER — FENTANYL CITRATE PF 50 MCG/ML IJ SOSY
50.0000 ug | PREFILLED_SYRINGE | Freq: Once | INTRAMUSCULAR | Status: AC
  Administered 2024-03-02: 50 ug via INTRAVENOUS
  Filled 2024-03-02: qty 1

## 2024-03-02 MED ORDER — AMOXICILLIN-POT CLAVULANATE 875-125 MG PO TABS: 1.0000 | ORAL_TABLET | Freq: Two times a day (BID) | ORAL | 0 refills | Status: AC

## 2024-03-02 MED ORDER — ONDANSETRON HCL 4 MG/2ML IJ SOLN
4.0000 mg | Freq: Once | INTRAMUSCULAR | Status: AC
  Administered 2024-03-02: 4 mg via INTRAVENOUS
  Filled 2024-03-02: qty 2

## 2024-03-02 MED ORDER — OXYCODONE HCL 5 MG PO TABS: 5.0000 mg | ORAL_TABLET | ORAL | 0 refills | Status: AC | PRN

## 2024-03-02 MED ORDER — AMOXICILLIN-POT CLAVULANATE 875-125 MG PO TABS
1.0000 | ORAL_TABLET | Freq: Once | ORAL | Status: AC
  Administered 2024-03-02: 1 via ORAL
  Filled 2024-03-02: qty 1

## 2024-03-02 MED ORDER — IOHEXOL 300 MG/ML  SOLN
100.0000 mL | Freq: Once | INTRAMUSCULAR | Status: AC | PRN
  Administered 2024-03-02: 100 mL via INTRAVENOUS

## 2024-03-02 NOTE — ED Triage Notes (Signed)
 POV from home. Cc of abdominal pain. Has history of diverticulitis. Feels like same. Started this morning

## 2024-03-02 NOTE — ED Provider Notes (Signed)
 [facility]-EMERGENCY DEPT [facility] Emergency Department Provider Note MRN:  [id number]  Arrival date & time: 03/02/24     Chief Complaint   Abdominal Pain   History of Present Illness   Alexa Wilson is a 78 y.o. year-old female with a history of diverticulitis presenting to the ED with chief complaint of abdominal pain.  Left lower quadrant abdominal pain worsening over the past day or 2.  Feels similar to prior episode of diverticulitis.  Hurts to move, no fever, no nausea vomiting, no dysuria hematuria.  Review of Systems  A thorough review of systems was obtained and all systems are negative except as noted in the HPI and PMH.   Patient's Health History    Past Medical History:  Diagnosis Date   Agoraphobia with panic disorder 33   Brown recluse spider bite    Constipation    Diverticulitis    GERD (gastroesophageal reflux disease)    Hyperlipidemia    Lyme disease    Migraines    "don't have them since menopause" (09/23/2016)   Panic disorder    Shingles    Tinnitus of both ears     Past Surgical History:  Procedure Laterality Date   APPENDECTOMY  1964   CHOLECYSTECTOMY OPEN  1966   COLONOSCOPY  2012   RMR: 1. External hemorrhoids, otherwise normal rectum. 2. Left-sided diverticula status post snare polypectomy left colon polyp. Remainder of the colonic mucosa and terminal ileum mucoa appeared normal.    COLONOSCOPY N/A 02/11/2017   Procedure: COLONOSCOPY;  Surgeon: Suzette Espy, MD; diverticulosis in the sigmoid and descending colon, 2 tubular adenomas, otherwise normal.  Repeat in 5 years.   COLONOSCOPY WITH PROPOFOL  N/A 04/09/2023   Procedure: COLONOSCOPY WITH PROPOFOL ;  Surgeon: Suzette Espy, MD;  Location: [facility]O SU[facility];  Service: Endoscopy;  Laterality: N/A;  1:00 pm, asa 2   DILATION AND CURETTAGE OF UTERUS     POLYPECTOMY  04/09/2023   Procedure: POLYPECTOMY;  Surgeon: Suzette Espy, MD;  Location: [facility]O [facility];  Service: Endoscopy;;    TONSILLECTOMY  1961    Family History  Problem Relation Age of Onset   Diabetes Mother    Hypertension Mother    Heart attack Mother    Cancer Father        Melanoma    Diabetes Brother    Diabetes Son    Colon cancer Neg Hx     Social History   Socioeconomic History   Marital status: Widowed    Spouse name: Not on file   Number of children: 5   Years of education: 77   Highest education level: 12th grade  Occupational History   Not on file  Tobacco Use   Smoking status: Never    Passive exposure: Never   Smokeless tobacco: Never  Vaping Use   Vaping status: Never Used  Substance and Sexual Activity   Alcohol  use: No    Alcohol /week: 0.0 standard drinks of alcohol    Drug use: No   Sexual activity: Not Currently    Partners: Male  Other Topics Concern   Not on file  Social History Narrative   Not on file   Social Drivers of Health   Financial Resource Strain: Low Risk  (11/01/2023)   Overall Financial Resource Strain (CARDIA)    Difficulty of Paying Living Expenses: Not hard at all  Food Insecurity: No Food Insecurity (11/01/2023)   Hunger Vital Sign    Worried About Running Out of  Food in the Last Year: Never true    Ran Out of Food in the Last Year: Never true  Transportation Needs: No Transportation Needs (11/01/2023)   PRAPARE - Administrator, Civil Service (Medical): No    Lack of Transportation (Non-Medical): No  Physical Activity: Sufficiently Active (11/01/2023)   Exercise Vital Sign    Days of Exercise per Week: 5 days    Minutes of Exercise per Session: 50 min  Stress: No Stress Concern Present (11/01/2023)   [employer] of Occupational Health - Occupational Stress Questionnaire    Feeling of Stress : Only a little  Social Connections: Moderately Integrated (11/01/2023)   Social Connection and Isolation Panel [NHANES]    Frequency of Communication with Friends and Family: More than three times a week    Frequency of Social  Gatherings with Friends and Family: More than three times a week    Attends Religious Services: More than 4 times per year    Active Member of [employer] or Organizations: Yes    Attends Banker Meetings: More than 4 times per year    Marital Status: Widowed  Intimate Partner Violence: Not At Risk (11/01/2023)   Humiliation, Afraid, Rape, and Kick questionnaire    Fear of Current or Ex-Partner: No    Emotionally Abused: No    Physically Abused: No    Sexually Abused: No     Physical Exam   Vitals:   03/02/24 0400 03/02/24 0405  BP: (!) 107/51   Pulse:  62  Resp:    Temp:    SpO2:  90%    CONSTITUTIONAL: Well-appearing, NAD NEURO/PSYCH:  Alert and oriented x 3, no focal deficits EYES:  eyes equal and reactive ENT/NECK:  no LAD, no JVD CARDIO: Regular rate, well-perfused, normal S1 and S2 PULM:  CTAB no wheezing or rhonchi GI/GU:  non-distended, moderate left lower quadrant tenderness to palpation MSK/SPINE:  No gross deformities, no edema SKIN:  no rash, atraumatic   *Additional and/or pertinent findings included in MDM below  Diagnostic and Interventional Summary    EKG Interpretation Date/Time:    Ventricular Rate:    PR Interval:    QRS Duration:    QT Interval:    QTC Calculation:   R Axis:      Text Interpretation:         Labs Reviewed  CBC - Abnormal; Notable for the following components:      Result Value   MCV 102.1 (*)    MCH 34.2 (*)    All other components within normal limits  URINALYSIS, ROUTINE W REFLEX MICROSCOPIC - Abnormal; Notable for the following components:   Color, Urine STRAW (*)    Leukocytes,Ua SMALL (*)    All other components within normal limits  COMPREHENSIVE METABOLIC PANEL WITH GFR - Abnormal; Notable for the following components:   Potassium 3.4 (*)    AST 49 (*)    All other components within normal limits  LIPASE, BLOOD    CT ABDOMEN PELVIS W CONTRAST  Final Result      Medications   amoxicillin -clavulanate (AUGMENTIN ) 875-125 MG per tablet 1 tablet (has no administration in time range)  ketorolac  (TORADOL ) 15 MG/ML injection 15 mg (15 mg Intravenous Given 03/02/24 0204)  ondansetron  (ZOFRAN ) injection 4 mg (4 mg Intravenous Given 03/02/24 0204)  sodium chloride  0.9 % bolus 1,000 mL (0 mLs Intravenous Stopped 03/02/24 0350)  iohexol  (OMNIPAQUE ) 300 MG/ML solution 100 mL (100 mLs Intravenous Contrast Given  03/02/24 0320)  fentaNYL  (SUBLIMAZE ) injection 50 mcg (50 mcg Intravenous Given 03/02/24 0350)     Procedures  /  Critical Care Procedures  ED Course and Medical Decision Making  Initial Impression and Ddx CT to evaluate for diverticulitis, complication of such such as intra-abdominal abscess, neoplasm  Past medical/surgical history that increases complexity of ED encounter: History of diverticulitis  Interpretation of Diagnostics I personally reviewed the Laboratory Testing and my interpretation is as follows: No significant blood count or electrolyte disturbance.  CT confirms diverticulitis, no other emergent process or complicating factors.  Patient Reassessment and Ultimate Disposition/Management     Patient is feeling better, vitals normal, appropriate for discharge.  Patient management required discussion with the following services or consulting groups:  None  Complexity of Problems Addressed Acute illness or injury that poses threat of life of bodily function  Additional Data Reviewed and Analyzed Further history obtained from: None  Additional Factors Impacting ED Encounter Risk Use of parenteral controlled substances and Consideration of hospitalization  Merrick Abe. Harless Lien, MD [facility] Emergency Medicine [facility] [email or web address]  Final Clinical Impressions(s) / ED Diagnoses     ICD-10-CM   1. Diverticulitis  K57.92       ED Discharge Orders          Ordered    amoxicillin -clavulanate (AUGMENTIN ) 875-125 MG tablet   Every 12 hours        03/02/24 0426    oxyCODONE  (ROXICODONE ) 5 MG immediate release tablet  Every 4 hours PRN        03/02/24 0426             Discharge Instructions Discussed with and Provided to Patient:     Discharge Instructions      You were evaluated in the Emergency Department and after careful evaluation, we did not find any emergent condition requiring admission or further testing in the hospital.  Your exam/testing today was overall reassuring.  Symptoms likely due to diverticulitis.  Take the Augmentin  antibiotic as directed to clear the infection.  Recommend Tylenol  1000 mg every 4-6 hours and/or Motrin  600 mg every 4-6 hours for pain.  You can use the oxycodone  medication for more significant pain.  Please return to the Emergency Department if you experience any worsening of your condition.  Thank you for allowing us  to be a part of your care.        Edson Graces, MD 03/02/24 [phone]

## 2024-03-02 NOTE — Discharge Instructions (Signed)
 You were evaluated in the Emergency Department and after careful evaluation, we did not find any emergent condition requiring admission or further testing in the hospital.  Your exam/testing today was overall reassuring.  Symptoms likely due to diverticulitis.  Take the Augmentin  antibiotic as directed to clear the infection.  Recommend Tylenol  1000 mg every 4-6 hours and/or Motrin  600 mg every 4-6 hours for pain.  You can use the oxycodone  medication for more significant pain.  Please return to the Emergency Department if you experience any worsening of your condition.  Thank you for allowing us  to be a part of your care.

## 2024-03-02 NOTE — ED Notes (Signed)
 Patient transported to CT

## 2024-03-30 DIAGNOSIS — L720 Epidermal cyst: Secondary | ICD-10-CM | POA: Diagnosis not present

## 2024-03-30 DIAGNOSIS — Z299 Encounter for prophylactic measures, unspecified: Secondary | ICD-10-CM | POA: Diagnosis not present

## 2024-03-30 DIAGNOSIS — K5792 Diverticulitis of intestine, part unspecified, without perforation or abscess without bleeding: Secondary | ICD-10-CM | POA: Diagnosis not present

## 2024-03-30 DIAGNOSIS — I10 Essential (primary) hypertension: Secondary | ICD-10-CM | POA: Diagnosis not present

## 2024-04-10 ENCOUNTER — Telehealth: Payer: Self-pay

## 2024-04-10 NOTE — Telephone Encounter (Signed)
 Pt is needing her last colonoscopy sent to her PCP.

## 2024-05-24 DIAGNOSIS — I10 Essential (primary) hypertension: Secondary | ICD-10-CM | POA: Diagnosis not present

## 2024-05-24 DIAGNOSIS — H9319 Tinnitus, unspecified ear: Secondary | ICD-10-CM | POA: Diagnosis not present

## 2024-05-24 DIAGNOSIS — R42 Dizziness and giddiness: Secondary | ICD-10-CM | POA: Diagnosis not present

## 2024-05-24 DIAGNOSIS — Z299 Encounter for prophylactic measures, unspecified: Secondary | ICD-10-CM | POA: Diagnosis not present

## 2024-06-08 DIAGNOSIS — F410 Panic disorder [episodic paroxysmal anxiety] without agoraphobia: Secondary | ICD-10-CM | POA: Diagnosis not present

## 2024-06-08 DIAGNOSIS — Z299 Encounter for prophylactic measures, unspecified: Secondary | ICD-10-CM | POA: Diagnosis not present

## 2024-06-08 DIAGNOSIS — I10 Essential (primary) hypertension: Secondary | ICD-10-CM | POA: Diagnosis not present

## 2024-06-08 DIAGNOSIS — R110 Nausea: Secondary | ICD-10-CM | POA: Diagnosis not present

## 2024-06-15 DIAGNOSIS — J029 Acute pharyngitis, unspecified: Secondary | ICD-10-CM | POA: Diagnosis not present

## 2024-06-15 DIAGNOSIS — R531 Weakness: Secondary | ICD-10-CM | POA: Diagnosis not present

## 2024-06-15 DIAGNOSIS — J209 Acute bronchitis, unspecified: Secondary | ICD-10-CM | POA: Diagnosis not present

## 2024-06-15 DIAGNOSIS — Z299 Encounter for prophylactic measures, unspecified: Secondary | ICD-10-CM | POA: Diagnosis not present

## 2024-06-23 DIAGNOSIS — H6992 Unspecified Eustachian tube disorder, left ear: Secondary | ICD-10-CM | POA: Diagnosis not present

## 2024-06-23 DIAGNOSIS — J209 Acute bronchitis, unspecified: Secondary | ICD-10-CM | POA: Diagnosis not present

## 2024-06-23 DIAGNOSIS — I10 Essential (primary) hypertension: Secondary | ICD-10-CM | POA: Diagnosis not present

## 2024-06-23 DIAGNOSIS — Z299 Encounter for prophylactic measures, unspecified: Secondary | ICD-10-CM | POA: Diagnosis not present

## 2024-06-30 DIAGNOSIS — R42 Dizziness and giddiness: Secondary | ICD-10-CM | POA: Diagnosis not present

## 2024-06-30 DIAGNOSIS — R0981 Nasal congestion: Secondary | ICD-10-CM | POA: Diagnosis not present

## 2024-06-30 DIAGNOSIS — J069 Acute upper respiratory infection, unspecified: Secondary | ICD-10-CM | POA: Diagnosis not present

## 2024-06-30 DIAGNOSIS — R059 Cough, unspecified: Secondary | ICD-10-CM | POA: Diagnosis not present

## 2024-06-30 DIAGNOSIS — Z299 Encounter for prophylactic measures, unspecified: Secondary | ICD-10-CM | POA: Diagnosis not present

## 2024-07-10 DIAGNOSIS — R52 Pain, unspecified: Secondary | ICD-10-CM | POA: Diagnosis not present

## 2024-07-10 DIAGNOSIS — I10 Essential (primary) hypertension: Secondary | ICD-10-CM | POA: Diagnosis not present

## 2024-07-10 DIAGNOSIS — I700 Atherosclerosis of aorta: Secondary | ICD-10-CM | POA: Diagnosis not present

## 2024-07-10 DIAGNOSIS — L989 Disorder of the skin and subcutaneous tissue, unspecified: Secondary | ICD-10-CM | POA: Diagnosis not present

## 2024-07-10 DIAGNOSIS — Z299 Encounter for prophylactic measures, unspecified: Secondary | ICD-10-CM | POA: Diagnosis not present

## 2024-07-18 DIAGNOSIS — I10 Essential (primary) hypertension: Secondary | ICD-10-CM | POA: Diagnosis not present

## 2024-07-18 DIAGNOSIS — I809 Phlebitis and thrombophlebitis of unspecified site: Secondary | ICD-10-CM | POA: Diagnosis not present

## 2024-07-18 DIAGNOSIS — I700 Atherosclerosis of aorta: Secondary | ICD-10-CM | POA: Diagnosis not present

## 2024-07-18 DIAGNOSIS — Z299 Encounter for prophylactic measures, unspecified: Secondary | ICD-10-CM | POA: Diagnosis not present

## 2024-07-19 DIAGNOSIS — Z1231 Encounter for screening mammogram for malignant neoplasm of breast: Secondary | ICD-10-CM | POA: Diagnosis not present

## 2024-07-19 DIAGNOSIS — M79602 Pain in left arm: Secondary | ICD-10-CM | POA: Diagnosis not present

## 2024-08-04 DIAGNOSIS — M79602 Pain in left arm: Secondary | ICD-10-CM | POA: Diagnosis not present

## 2024-08-04 DIAGNOSIS — Z2821 Immunization not carried out because of patient refusal: Secondary | ICD-10-CM | POA: Diagnosis not present

## 2024-08-04 DIAGNOSIS — I10 Essential (primary) hypertension: Secondary | ICD-10-CM | POA: Diagnosis not present

## 2024-08-04 DIAGNOSIS — Z299 Encounter for prophylactic measures, unspecified: Secondary | ICD-10-CM | POA: Diagnosis not present

## 2024-08-14 DIAGNOSIS — I700 Atherosclerosis of aorta: Secondary | ICD-10-CM | POA: Diagnosis not present

## 2024-08-14 DIAGNOSIS — S80822A Blister (nonthermal), left lower leg, initial encounter: Secondary | ICD-10-CM | POA: Diagnosis not present

## 2024-08-14 DIAGNOSIS — Z299 Encounter for prophylactic measures, unspecified: Secondary | ICD-10-CM | POA: Diagnosis not present

## 2024-08-14 DIAGNOSIS — I10 Essential (primary) hypertension: Secondary | ICD-10-CM | POA: Diagnosis not present

## 2024-09-06 DIAGNOSIS — Z1339 Encounter for screening examination for other mental health and behavioral disorders: Secondary | ICD-10-CM | POA: Diagnosis not present

## 2024-09-06 DIAGNOSIS — E7800 Pure hypercholesterolemia, unspecified: Secondary | ICD-10-CM | POA: Diagnosis not present

## 2024-09-06 DIAGNOSIS — Z0000 Encounter for general adult medical examination without abnormal findings: Secondary | ICD-10-CM | POA: Diagnosis not present

## 2024-09-06 DIAGNOSIS — Z6833 Body mass index (BMI) 33.0-33.9, adult: Secondary | ICD-10-CM | POA: Diagnosis not present

## 2024-09-06 DIAGNOSIS — R5383 Other fatigue: Secondary | ICD-10-CM | POA: Diagnosis not present

## 2024-09-06 DIAGNOSIS — I10 Essential (primary) hypertension: Secondary | ICD-10-CM | POA: Diagnosis not present

## 2024-09-06 DIAGNOSIS — Z7189 Other specified counseling: Secondary | ICD-10-CM | POA: Diagnosis not present

## 2024-09-06 DIAGNOSIS — F419 Anxiety disorder, unspecified: Secondary | ICD-10-CM | POA: Diagnosis not present

## 2024-09-06 DIAGNOSIS — Z299 Encounter for prophylactic measures, unspecified: Secondary | ICD-10-CM | POA: Diagnosis not present

## 2024-09-06 DIAGNOSIS — Z1331 Encounter for screening for depression: Secondary | ICD-10-CM | POA: Diagnosis not present

## 2024-11-21 NOTE — Progress Notes (Unsigned)
 "    HPI: FU palpitations and chest pain. Echocardiogram in September of 2014 showed normal LV function. There was grade 1 diastolic dysfunction. Trace mitral regurgitation. Patient has had intermittent palpitations for years. Exercise treadmill in November of 2014 showed poor exercise tolerance. There was no chest pain, no ECG changes and no arrhythmias. Monitor in November of 2014 showed sinus rhythm with PACs and PVCs.  Abdominal CT May 2025 showed cirrhotic liver, aortic and coronary artery atherosclerosis.  Since I last saw her,   Current Outpatient Medications  Medication Sig Dispense Refill   acetaminophen  (TYLENOL ) 500 MG tablet Take 500 mg by mouth every 6 (six) hours as needed for mild pain or moderate pain.     alprazolam  (XANAX ) 2 MG tablet Take 1 mg by mouth 3 (three) times daily.     amoxicillin -clavulanate (AUGMENTIN ) 875-125 MG tablet Take 1 tablet by mouth every 12 (twelve) hours. 14 tablet 0   levothyroxine  (SYNTHROID ) 25 MCG tablet Take 25 mcg by mouth daily before breakfast.     metoprolol  succinate (TOPROL -XL) 25 MG 24 hr tablet Take 0.5 tablets (12.5 mg total) by mouth daily. 45 tablet 3   ondansetron  (ZOFRAN ) 4 MG tablet Take 1 tablet (4 mg total) by mouth every 6 (six) hours. (Patient taking differently: Take 4 mg by mouth every 8 (eight) hours as needed.) 12 tablet 0   OVER THE COUNTER MEDICATION Vit D 3 takes one every 2-3 days     oxyCODONE  (ROXICODONE ) 5 MG immediate release tablet Take 1 tablet (5 mg total) by mouth every 4 (four) hours as needed for severe pain (pain score 7-10). 8 tablet 0   polyethylene glycol (MIRALAX / GLYCOLAX) 17 g packet Take 17 g by mouth daily as needed for mild constipation or moderate constipation.     rosuvastatin  (CRESTOR ) 10 MG tablet Take 10 mg by mouth daily at 6 PM.     No current facility-administered medications for this visit.     Past Medical History:  Diagnosis Date   Agoraphobia with panic disorder 33   Brown recluse  spider bite    Constipation    Diverticulitis    GERD (gastroesophageal reflux disease)    Hyperlipidemia    Lyme disease    Migraines    don't have them since menopause (09/23/2016)   Panic disorder    Shingles    Tinnitus of both ears     Past Surgical History:  Procedure Laterality Date   APPENDECTOMY  1964   CHOLECYSTECTOMY OPEN  1966   COLONOSCOPY  2012   RMR: 1. External hemorrhoids, otherwise normal rectum. 2. Left-sided diverticula status post snare polypectomy left colon polyp. Remainder of the colonic mucosa and terminal ileum mucoa appeared normal.    COLONOSCOPY N/A 02/11/2017   Procedure: COLONOSCOPY;  Surgeon: Lamar CHRISTELLA Hollingshead, MD; diverticulosis in the sigmoid and descending colon, 2 tubular adenomas, otherwise normal.  Repeat in 5 years.   COLONOSCOPY WITH PROPOFOL  N/A 04/09/2023   Procedure: COLONOSCOPY WITH PROPOFOL ;  Surgeon: Hollingshead Lamar CHRISTELLA, MD;  Location[facility];  Service: Endoscopy;  Laterality: N/A;  1:00 pm, asa 2   DILATION AND CURETTAGE OF UTERUS     POLYPECTOMY  04/09/2023   Procedure: POLYPECTOMY;  Surgeon: Hollingshead Lamar CHRISTELLA, MD;  Location[facility];  Service: Endoscopy;;   TONSILLECTOMY  1961    Social History   Socioeconomic History   Marital status: Widowed    Spouse name: Not on file   Number of  children: 5   Years of education: 12   Highest education level: 12th grade  Occupational History   Not on file  Tobacco Use   Smoking status: Never    Passive exposure: Never   Smokeless tobacco: Never  Vaping Use   Vaping status: Never Used  Substance and Sexual Activity   Alcohol  use: No    Alcohol /week: 0.0 standard drinks of alcohol    Drug use: No   Sexual activity: Not Currently    Partners: Male  Other Topics Concern   Not on file  Social History Narrative   Not on file   Social Drivers of Health   Tobacco Use: Low Risk  (07/19/2024)   Received from [facility]   Patient History    Smoking Tobacco Use: Never     Smokeless Tobacco Use: Never    Passive Exposure: Not on file  Financial Resource Strain: Low Risk (11/01/2023)   Overall Financial Resource Strain (CARDIA)    Difficulty of Paying Living Expenses: Not hard at all  Food Insecurity: No Food Insecurity (11/01/2023)   Hunger Vital Sign    Worried About Running Out of Food in the Last Year: Never true    Ran Out of Food in the Last Year: Never true  Transportation Needs: No Transportation Needs (11/01/2023)   PRAPARE - Administrator, Civil Service (Medical): No    Lack of Transportation (Non-Medical): No  Physical Activity: Sufficiently Active (11/01/2023)   Exercise Vital Sign    Days of Exercise per Week: 5 days    Minutes of Exercise per Session: 50 min  Stress: No Stress Concern Present (11/01/2023)   [employer] of Occupational Health - Occupational Stress Questionnaire    Feeling of Stress : Only a little  Social Connections: Moderately Integrated (11/01/2023)   Social Connection and Isolation Panel    Frequency of Communication with Friends and Family: More than three times a week    Frequency of Social Gatherings with Friends and Family: More than three times a week    Attends Religious Services: More than 4 times per year    Active Member of [employer] or Organizations: Yes    Attends Banker Meetings: More than 4 times per year    Marital Status: Widowed  Intimate Partner Violence: Not At Risk (11/01/2023)   Humiliation, Afraid, Rape, and Kick questionnaire    Fear of Current or Ex-Partner: No    Emotionally Abused: No    Physically Abused: No    Sexually Abused: No  Depression (PHQ2-9): Low Risk (11/01/2023)   Depression (PHQ2-9)    PHQ-2 Score: 1  Alcohol  Screen: Low Risk (11/01/2023)   Alcohol  Screen    Last Alcohol  Screening Score (AUDIT): 0  Housing: Unknown (11/01/2023)   Housing Stability Vital Sign    Unable to Pay for Housing in the Last Year: No    Number of Times Moved in the Last  Year: Not on file    Homeless in the Last Year: No  Utilities: Not At Risk (11/01/2023)   [employer] Utilities    Threatened with loss of utilities: No  Health Literacy: Adequate Health Literacy (11/01/2023)   B1300 Health Literacy    Frequency of need for help with medical instructions: Never    Family History  Problem Relation Age of Onset   Diabetes Mother    Hypertension Mother    Heart attack Mother    Cancer Father        Melanoma  Diabetes Brother    Diabetes Son    Colon cancer Neg Hx     ROS: no fevers or chills, productive cough, hemoptysis, dysphasia, odynophagia, melena, hematochezia, dysuria, hematuria, rash, seizure activity, orthopnea, PND, pedal edema, claudication. Remaining systems are negative.  Physical Exam: Well-developed well-nourished in no acute distress.  Skin is warm and dry.  HEENT is normal.  Neck is supple.  Chest is clear to auscultation with normal expansion.  Cardiovascular exam is regular rate and rhythm.  Abdominal exam nontender or distended. No masses palpated. Extremities show no edema. neuro grossly intact  ECG- personally reviewed  A/P  1 coronary calcification-patient denies recurrent chest pain.  Continue medical therapy.  Continue statin.  2 palpitations-continue Toprol .  Symptoms are controlled at present.  3 hyperlipidemia-continue statin.  Redell Shallow, MD    "

## 2024-11-29 ENCOUNTER — Encounter: Payer: Self-pay | Admitting: General Practice

## 2024-12-04 ENCOUNTER — Ambulatory Visit: Admitting: Cardiology

## 2025-03-30 ENCOUNTER — Ambulatory Visit: Admitting: Cardiology
